# Patient Record
Sex: Male | Born: 1965
Health system: Southern US, Community
[De-identification: ages and names within clinical notes are randomized; demographics above are authoritative.]

## PROBLEM LIST (undated history)

## (undated) DIAGNOSIS — F419 Anxiety disorder, unspecified: Secondary | ICD-10-CM

## (undated) DIAGNOSIS — M810 Age-related osteoporosis without current pathological fracture: Secondary | ICD-10-CM

## (undated) DIAGNOSIS — I10 Essential (primary) hypertension: Secondary | ICD-10-CM

## (undated) DIAGNOSIS — F32A Depression, unspecified: Secondary | ICD-10-CM

## (undated) DIAGNOSIS — C4491 Basal cell carcinoma of skin, unspecified: Secondary | ICD-10-CM

## (undated) DIAGNOSIS — I428 Other cardiomyopathies: Secondary | ICD-10-CM

## (undated) DIAGNOSIS — G5600 Carpal tunnel syndrome, unspecified upper limb: Secondary | ICD-10-CM

## (undated) DIAGNOSIS — Z720 Tobacco use: Secondary | ICD-10-CM

## (undated) DIAGNOSIS — F329 Major depressive disorder, single episode, unspecified: Secondary | ICD-10-CM

## (undated) DIAGNOSIS — J189 Pneumonia, unspecified organism: Secondary | ICD-10-CM

## (undated) DIAGNOSIS — Z95810 Presence of automatic (implantable) cardiac defibrillator: Secondary | ICD-10-CM

## (undated) DIAGNOSIS — I509 Heart failure, unspecified: Secondary | ICD-10-CM

## (undated) DIAGNOSIS — G4733 Obstructive sleep apnea (adult) (pediatric): Secondary | ICD-10-CM

## (undated) DIAGNOSIS — H269 Unspecified cataract: Secondary | ICD-10-CM

## (undated) DIAGNOSIS — Z973 Presence of spectacles and contact lenses: Secondary | ICD-10-CM

## (undated) DIAGNOSIS — I472 Ventricular tachycardia, unspecified: Secondary | ICD-10-CM

## (undated) DIAGNOSIS — E11319 Type 2 diabetes mellitus with unspecified diabetic retinopathy without macular edema: Secondary | ICD-10-CM

## (undated) DIAGNOSIS — E785 Hyperlipidemia, unspecified: Secondary | ICD-10-CM

## (undated) DIAGNOSIS — I5022 Chronic systolic (congestive) heart failure: Secondary | ICD-10-CM

## (undated) DIAGNOSIS — G4730 Sleep apnea, unspecified: Secondary | ICD-10-CM

## (undated) DIAGNOSIS — J449 Chronic obstructive pulmonary disease, unspecified: Secondary | ICD-10-CM

## (undated) DIAGNOSIS — C801 Malignant (primary) neoplasm, unspecified: Secondary | ICD-10-CM

## (undated) HISTORY — DX: Age-related osteoporosis without current pathological fracture: M81.0

## (undated) HISTORY — DX: Anxiety disorder, unspecified: F41.9

## (undated) HISTORY — DX: Other cardiomyopathies: I42.8

## (undated) HISTORY — DX: Chronic obstructive pulmonary disease, unspecified: J44.9

## (undated) HISTORY — DX: Unspecified cataract: H26.9

## (undated) HISTORY — DX: Obstructive sleep apnea (adult) (pediatric): G47.33

## (undated) HISTORY — DX: Type 2 diabetes mellitus with unspecified diabetic retinopathy without macular edema: E11.319

## (undated) HISTORY — DX: Hyperlipidemia, unspecified: E78.5

## (undated) HISTORY — DX: Chronic systolic (congestive) heart failure: I50.22

## (undated) HISTORY — DX: Heart failure, unspecified: I50.9

## (undated) HISTORY — DX: Essential (primary) hypertension: I10

## (undated) HISTORY — PX: TONSILLECTOMY: SUR1361

## (undated) HISTORY — DX: Basal cell carcinoma of skin, unspecified: C44.91

## (undated) HISTORY — DX: Tobacco use: Z72.0

## (undated) HISTORY — DX: Ventricular tachycardia, unspecified: I47.20

## (undated) HISTORY — PX: CARPAL TUNNEL RELEASE: SHX101

## (undated) HISTORY — PX: EYE SURGERY: SHX253

---

## 1998-10-30 ENCOUNTER — Emergency Department (HOSPITAL_COMMUNITY): Admission: EM | Admit: 1998-10-30 | Discharge: 1998-10-30 | Payer: Self-pay

## 1999-02-16 HISTORY — PX: CARPAL TUNNEL RELEASE: SHX101

## 2001-06-02 ENCOUNTER — Encounter: Payer: Self-pay | Admitting: Emergency Medicine

## 2001-06-02 ENCOUNTER — Emergency Department (HOSPITAL_COMMUNITY): Admission: EM | Admit: 2001-06-02 | Discharge: 2001-06-02 | Payer: Self-pay | Admitting: Emergency Medicine

## 2002-07-22 ENCOUNTER — Emergency Department (HOSPITAL_COMMUNITY): Admission: EM | Admit: 2002-07-22 | Discharge: 2002-07-22 | Payer: Self-pay | Admitting: Emergency Medicine

## 2002-07-22 ENCOUNTER — Encounter: Payer: Self-pay | Admitting: Emergency Medicine

## 2003-12-28 ENCOUNTER — Ambulatory Visit: Payer: Self-pay | Admitting: Internal Medicine

## 2004-01-27 ENCOUNTER — Ambulatory Visit: Payer: Self-pay

## 2004-02-01 ENCOUNTER — Observation Stay (HOSPITAL_COMMUNITY): Admission: AD | Admit: 2004-02-01 | Discharge: 2004-02-02 | Payer: Self-pay | Admitting: Internal Medicine

## 2004-02-01 ENCOUNTER — Ambulatory Visit: Payer: Self-pay | Admitting: Internal Medicine

## 2004-02-01 HISTORY — PX: CARDIAC DEFIBRILLATOR PLACEMENT: SHX171

## 2004-02-21 ENCOUNTER — Ambulatory Visit: Payer: Self-pay | Admitting: Internal Medicine

## 2004-05-09 ENCOUNTER — Ambulatory Visit: Payer: Self-pay | Admitting: Internal Medicine

## 2004-08-28 ENCOUNTER — Ambulatory Visit: Payer: Self-pay

## 2004-08-28 ENCOUNTER — Ambulatory Visit: Payer: Self-pay | Admitting: Internal Medicine

## 2005-01-01 ENCOUNTER — Ambulatory Visit: Payer: Self-pay | Admitting: Internal Medicine

## 2005-04-16 ENCOUNTER — Ambulatory Visit: Payer: Self-pay

## 2005-07-19 ENCOUNTER — Ambulatory Visit: Payer: Self-pay | Admitting: Internal Medicine

## 2005-10-18 ENCOUNTER — Ambulatory Visit: Payer: Self-pay | Admitting: Internal Medicine

## 2006-01-17 ENCOUNTER — Ambulatory Visit: Payer: Self-pay | Admitting: Internal Medicine

## 2006-01-29 ENCOUNTER — Ambulatory Visit: Payer: Self-pay | Admitting: Internal Medicine

## 2006-05-02 ENCOUNTER — Ambulatory Visit: Payer: Self-pay | Admitting: Internal Medicine

## 2006-10-03 ENCOUNTER — Ambulatory Visit: Payer: Self-pay | Admitting: Internal Medicine

## 2007-01-21 ENCOUNTER — Ambulatory Visit: Payer: Self-pay | Admitting: Internal Medicine

## 2007-04-24 ENCOUNTER — Ambulatory Visit: Payer: Self-pay | Admitting: Internal Medicine

## 2007-07-24 ENCOUNTER — Ambulatory Visit: Payer: Self-pay | Admitting: Internal Medicine

## 2007-10-23 ENCOUNTER — Ambulatory Visit: Payer: Self-pay | Admitting: Internal Medicine

## 2008-02-10 ENCOUNTER — Ambulatory Visit: Payer: Self-pay | Admitting: Internal Medicine

## 2008-02-11 ENCOUNTER — Encounter: Payer: Self-pay | Admitting: Internal Medicine

## 2008-05-13 ENCOUNTER — Ambulatory Visit: Payer: Self-pay | Admitting: Internal Medicine

## 2008-05-25 ENCOUNTER — Encounter: Payer: Self-pay | Admitting: Internal Medicine

## 2008-08-12 ENCOUNTER — Ambulatory Visit: Payer: Self-pay | Admitting: Internal Medicine

## 2008-11-11 ENCOUNTER — Ambulatory Visit: Payer: Self-pay | Admitting: Internal Medicine

## 2008-11-24 ENCOUNTER — Encounter: Payer: Self-pay | Admitting: Internal Medicine

## 2009-01-26 DIAGNOSIS — E1165 Type 2 diabetes mellitus with hyperglycemia: Secondary | ICD-10-CM | POA: Insufficient documentation

## 2009-01-26 DIAGNOSIS — I509 Heart failure, unspecified: Secondary | ICD-10-CM | POA: Insufficient documentation

## 2009-01-26 DIAGNOSIS — IMO0002 Reserved for concepts with insufficient information to code with codable children: Secondary | ICD-10-CM | POA: Insufficient documentation

## 2009-01-26 DIAGNOSIS — I428 Other cardiomyopathies: Secondary | ICD-10-CM | POA: Insufficient documentation

## 2009-01-27 ENCOUNTER — Ambulatory Visit: Payer: Self-pay | Admitting: Internal Medicine

## 2009-04-28 ENCOUNTER — Ambulatory Visit: Payer: Self-pay | Admitting: Internal Medicine

## 2009-04-28 ENCOUNTER — Encounter: Payer: Self-pay | Admitting: Internal Medicine

## 2009-07-28 ENCOUNTER — Ambulatory Visit: Payer: Self-pay | Admitting: Internal Medicine

## 2009-11-11 ENCOUNTER — Encounter: Payer: Self-pay | Admitting: Internal Medicine

## 2010-02-14 ENCOUNTER — Ambulatory Visit
Admission: RE | Admit: 2010-02-14 | Discharge: 2010-02-14 | Payer: Self-pay | Source: Home / Self Care | Attending: Internal Medicine | Admitting: Internal Medicine

## 2010-02-16 NOTE — Progress Notes (Signed)
Summary: Med List   Med List   Imported By: Roderic Ovens 02/14/2009 16:09:32  _____________________________________________________________________  External Attachment:    Type:   Image     Comment:   External Document

## 2010-02-16 NOTE — Cardiovascular Report (Signed)
Summary: Office Visit Remote   Office Visit Remote   Imported By: Roderic Ovens 08/04/2009 16:09:00  _____________________________________________________________________  External Attachment:    Type:   Image     Comment:   External Document

## 2010-02-16 NOTE — Cardiovascular Report (Signed)
Summary: Office Visit Remote  Office Visit Remote   Imported By: Roderic Ovens 05/04/2009 14:56:22  _____________________________________________________________________  External Attachment:    Type:   Image     Comment:   External Document

## 2010-02-16 NOTE — Assessment & Plan Note (Signed)
Summary: PER CHECK OUT/SF  Medications Added CARVEDILOL 25 MG TABS (CARVEDILOL) Take two tablets by mouth twice a day CARVEDILOL 12.5 MG TABS (CARVEDILOL) Take one tablet by mouth twice a day FUROSEMIDE 40 MG TABS (FUROSEMIDE) Take one tablet by mouth two times a day AMLODIPINE BESYLATE 5 MG TABS (AMLODIPINE BESYLATE) Take one tablet by mouth daily LISINOPRIL 20 MG TABS (LISINOPRIL) Take one tablet by mouth two times a day DIGOXIN 0.125 MG TABS (DIGOXIN) Take one tablet by mouth daily CRESTOR 10 MG TABS (ROSUVASTATIN CALCIUM) Take one tablet by mouth daily. TRICOR 145 MG TABS (FENOFIBRATE) Take one tablet by mouth once daily. ASPIRIN 81 MG TBEC (ASPIRIN) Take one tablet by mouth daily EFFEXOR XR 75 MG XR24H-CAP (VENLAFAXINE HCL) 3 tabs daily TERBINAFINE HCL 250 MG TABS (TERBINAFINE HCL) Take one tablet by mouth once daily. ALPRAZOLAM 1 MG TABS (ALPRAZOLAM) Take one tablet by mouth once daily. CLONAZEPAM 1 MG TABS (CLONAZEPAM) Take one tablet by mouth once daily. CYCLOBENZAPRINE HCL 10 MG TABS (CYCLOBENZAPRINE HCL) Take one tablet by mouth once daily. HUMULIN 70/30 70-30 % SUSP (INSULIN ISOPHANE & REGULAR) UAD      Allergies Added: ! SULFA  History of Present Illness: Mr. Pallas returns today for followup.  He is a very pleasant young male with nonischemic cardiomyopathy, congestive heart failure status post ICD insertion back in January 2006.  At that time, had a Guidant single-chamber device placed.  He returns today for followup.   He has had no intercurrent ICD therapies.  He denies chest pain.  His heart failure is class II.  He is still working.   Current Medications (verified): 1)  Carvedilol 25 Mg Tabs (Carvedilol) .... Take Two Tablets By Mouth Twice A Day 2)  Carvedilol 12.5 Mg Tabs (Carvedilol) .... Take One Tablet By Mouth Twice A Day 3)  Furosemide 40 Mg Tabs (Furosemide) .... Take One Tablet By Mouth Two Times A Day 4)  Amlodipine Besylate 5 Mg Tabs (Amlodipine  Besylate) .... Take One Tablet By Mouth Daily 5)  Lisinopril 20 Mg Tabs (Lisinopril) .... Take One Tablet By Mouth Two Times A Day 6)  Digoxin 0.125 Mg Tabs (Digoxin) .... Take One Tablet By Mouth Daily 7)  Crestor 10 Mg Tabs (Rosuvastatin Calcium) .... Take One Tablet By Mouth Daily. 8)  Tricor 145 Mg Tabs (Fenofibrate) .... Take One Tablet By Mouth Once Daily. 9)  Aspirin 81 Mg Tbec (Aspirin) .... Take One Tablet By Mouth Daily 10)  Effexor Xr 75 Mg Xr24h-Cap (Venlafaxine Hcl) .... 3 Tabs Daily 11)  Terbinafine Hcl 250 Mg Tabs (Terbinafine Hcl) .... Take One Tablet By Mouth Once Daily. 12)  Alprazolam 1 Mg Tabs (Alprazolam) .... Take One Tablet By Mouth Once Daily. 13)  Clonazepam 1 Mg Tabs (Clonazepam) .... Take One Tablet By Mouth Once Daily. 14)  Cyclobenzaprine Hcl 10 Mg Tabs (Cyclobenzaprine Hcl) .... Take One Tablet By Mouth Once Daily. 15)  Humulin 70/30 70-30 % Susp (Insulin Isophane & Regular) .... Uad  Allergies (verified): 1)  ! Sulfa  Past History:  Past Medical History: Last updated: 01/26/2009 Current Problems:  IMPLANTATION OF DEFIBRILLATOR,GUIDANT MODEL #T177 (ICD-V45.02) CHF (ICD-428.0) CARDIOMYOPATHY (ICD-425.4) DYSLIPIDEMIA (ICD-272.4) DM (ICD-250.00)    Past Surgical History: Last updated: 01/26/2009  DATE OF PROCEDURE:  02/01/2004  PHYSICIAN:  Doylene Canning. Ladona Ridgel, M.D.  PROCEDURE:  On February 01, 2004, implantation of Guidant VITALITY 2 EL ICD,  model T177, with defibrillation threshold study less  than or equal to 21 joules, with acceptable pacing  sensing.  Review of Systems  The patient denies chest pain, syncope, dyspnea on exertion, and peripheral edema.    Vital Signs:  Patient profile:   45 year old male Height:      68 inches Weight:      196 pounds BMI:     29.91 Pulse rate:   82 / minute BP sitting:   120 / 80  (left arm)  Vitals Entered By: Laurance Flatten CMA (January 27, 2009 9:17 AM)  Physical Exam  General:  Well developed, well  nourished, in no acute distress.  HEENT: normal Neck: supple. No JVD. Carotids 2+ bilaterally no bruits Cor: RRR no rubs, gallops or murmur. PMI is enlarged. Lungs: CTA.  Well healed ICD incision. Ab: soft, nontender. nondistended. No HSM. Good bowel sounds Ext: warm. no cyanosis, clubbing or edema Neuro: alert and oriented. Grossly nonfocal. affect pleasant    ICD Specifications Following MD:  Lewayne Bunting, MD     ICD Vendor:  [facility] [employer]     ICD Model Number:  [phone]     ICD Serial Number:  [id number] ICD DOI:  02/01/2004     ICD Implanting MD:  Lewayne Bunting, MD  Lead 1:    Location: RV     DOI: 02/01/2004     Model #: [id number]     Serial #: [id number]     Status: active  Indications::  NICM   ICD Follow Up Remote Check?  No Battery Voltage:  2.70 V     Charge Time:  9.5 seconds     Underlying rhythm:  SR ICD Dependent:  No       ICD Device Measurements Right Ventricle:  Amplitude: 11.1 mV, Impedance: 550 ohms, Threshold: 1.0 V at 0.4 msec Shock Impedance: 57 ohms   Episodes Shock:  0     ATP:  0     Nonsustained:  0     Ventricular Pacing:  0%  Brady Parameters Mode VVI     Lower Rate Limit:  40      Tachy Zones VF:  240     VT:  200     VT1:  180     Next Remote Date:  04/28/2009     Next Cardiology Appt Due:  01/16/2010 Tech Comments:  Normal device function.  No changes made today.  Pt does Latitude transmisisons.  ROV 12 months Gt. Gypsy Balsam RN BSN  January 27, 2009 9:32 AM  MD Comments:  Agree with above.  Impression & Recommendations:  Problem # 1:  IMPLANTATION OF DEFIBRILLATOR,GUIDANT MODEL #T177 (ICD-V45.02) His device is working normally today.  Will recheck in several months.  Problem # 2:  CHF (ICD-428.0) His CHF remains class 1-2 on maximal medical therapy.  Continue current meds. A low sodium diet is recommended. His updated medication list for this problem includes:    Carvedilol 25 Mg Tabs (Carvedilol) .Marland Kitchen... Take two tablets by mouth twice a day     Carvedilol 12.5 Mg Tabs (Carvedilol) .Marland Kitchen... Take one tablet by mouth twice a day    Furosemide 40 Mg Tabs (Furosemide) .Marland Kitchen... Take one tablet by mouth two times a day    Amlodipine Besylate 5 Mg Tabs (Amlodipine besylate) .Marland Kitchen... Take one tablet by mouth daily    Lisinopril 20 Mg Tabs (Lisinopril) .Marland Kitchen... Take one tablet by mouth two times a day    Digoxin 0.125 Mg Tabs (Digoxin) .Marland Kitchen... Take one tablet by mouth daily    Aspirin 81 Mg Tbec (Aspirin) .Marland KitchenMarland KitchenMarland KitchenMarland Kitchen  Take one tablet by mouth daily  Patient Instructions: 1)  Your physician recommends that you schedule a follow-up appointment in: 12 months with Dr Ladona Ridgel

## 2010-02-16 NOTE — Letter (Signed)
Summary: Device-Delinquent Phone Journalist, newspaper, Main Office  [address]   [place], [place] [address]   Phone: [phone]  Fax: [phone]     November 11, 2009 MRN: [id number]   Justin Ray [address] [place], [place]  [address]   Dear [name],  According to our records, you were scheduled for a device phone transmission on 11-03-2009.     We did not receive any results from this check.  If you transmitted on your scheduled day, please call us to help troubleshoot your system.  If you forgot to send your transmission, please send one upon receipt of this letter.  Thank you,   [facility]

## 2010-02-16 NOTE — Cardiovascular Report (Signed)
Summary: Office Visit   Office Visit   Imported By: Roderic Ovens 02/02/2009 13:58:03  _____________________________________________________________________  External Attachment:    Type:   Image     Comment:   External Document

## 2010-02-22 NOTE — Assessment & Plan Note (Signed)
Summary: defib [email or web address]  Medications Added LOVAZA 1 GM CAPS (OMEGA-3-ACID ETHYL ESTERS) two times a day        Visit Type:  Follow-up   History of Present Illness: [name] returns today for followup.  He is a very pleasant young male with nonischemic cardiomyopathy, congestive heart failure status post ICD insertion back in January 2006.  At that time, had a Guidant single-chamber device placed.  He returns today for followup.   He has had no intercurrent ICD therapies.  He denies chest pain.  His heart failure is class II.  He is still working.   Current Medications (verified): 1)  Carvedilol 25 Mg Tabs (Carvedilol) .... Take Two Tablets By Mouth Twice A Day 2)  Furosemide 40 Mg Tabs (Furosemide) .... Take One Tablet By Mouth Two Times A Day 3)  Lisinopril 20 Mg Tabs (Lisinopril) .... Take One Tablet By Mouth Two Times A Day 4)  Digoxin 0.125 Mg Tabs (Digoxin) .... Take One Tablet By Mouth Daily 5)  Crestor 10 Mg Tabs (Rosuvastatin Calcium) .... Take One Tablet By Mouth Daily. 6)  Tricor 145 Mg Tabs (Fenofibrate) .... Take One Tablet By Mouth Once Daily. 7)  Aspirin 81 Mg Tbec (Aspirin) .... Take One Tablet By Mouth Daily 8)  Effexor Xr 75 Mg Xr24h-Cap (Venlafaxine Hcl) .... 3 Tabs Daily 9)  Alprazolam 1 Mg Tabs (Alprazolam) .... Take One Tablet By Mouth Once Daily. 10)  Clonazepam 1 Mg Tabs (Clonazepam) .... Take One Tablet By Mouth Once Daily. 11)  Humulin 70/30 70-30 % Susp (Insulin Isophane & Regular) .... Uad 12)  Lovaza 1 Gm Caps (Omega-3-Acid Ethyl Esters) .... Two Times A Day  Allergies: 1)  ! Sulfa  Past History:  Past Medical History: Last updated: 01/26/2009 Current Problems:  IMPLANTATION OF DEFIBRILLATOR,GUIDANT MODEL #T177 (ICD-V45.02) CHF (ICD-428.0) CARDIOMYOPATHY (ICD-425.4) DYSLIPIDEMIA (ICD-272.4) DM (ICD-250.00)    Review of Systems  The patient denies chest pain, syncope, dyspnea on exertion, and peripheral edema.    Vital  Signs:  Patient profile:   45 year old male Height:      68 inches Weight:      192 pounds BMI:     29.30 Pulse rate:   82 / minute BP sitting:   144 / 88  (left arm)  Vitals Entered By: Laurance Flatten CMA (February 14, 2010 3:50 PM)  Physical Exam  General:  Well developed, well nourished, in no acute distress.  HEENT: normal Neck: supple. No JVD. Carotids 2+ bilaterally no bruits Cor: RRR no rubs, gallops or murmur. PMI is enlarged. Lungs: CTA.  Well healed ICD incision. Ab: soft, nontender. nondistended. No HSM. Good bowel sounds Ext: warm. no cyanosis, clubbing or edema Neuro: alert and oriented. Grossly nonfocal. affect pleasant    ICD Specifications Following MD:  Justin Bunting, MD     ICD Vendor:  [facility] [employer]     ICD Model Number:  [id number]     ICD Serial Number:  [id number] ICD DOI:  02/01/2004     ICD Implanting MD:  Justin Bunting, MD  Lead 1:    Location: RV     DOI: 02/01/2004     Model #: 0981     Serial #: [id number]     Status: active  Indications::  NICM   ICD Follow Up Battery Voltage:  2.60 V     Charge Time:  12.5 seconds     Underlying rhythm:  SR @ 96 ICD Dependent:  No  ICD Device Measurements Right Ventricle:  Amplitude: 12.5 mV, Impedance: 563 ohms, Threshold: 1.0 V at 0.4 msec Shock Impedance: 56 ohms   Episodes MS Episodes:  0     Percent Mode Switch:  0     Shock:  0     ATP:  0     Nonsustained:  0     Atrial Therapies:  0 Ventricular Pacing:  0%  Brady Parameters Mode VVI     Lower Rate Limit:  40      Tachy Zones VF:  240     VT:  200     VT1:  180     Next Remote Date:  05/18/2010     Next Cardiology Appt Due:  01/16/2011 Tech Comments:  NORMAL DEVICE FUNCTION.  NO EPISODES SINCE LAST CHECK. NO CHANGES MADE. LATITUDE 05-18-10 AND ROV IN 12 MTHS W/GT. Vella Kohler  February 14, 2010 3:59 PM MD Comments:  Agree with above.  Impression & Recommendations:  Problem # 1:  IMPLANTATION OF DEFIBRILLATOR,GUIDANT MODEL #T177 (ICD-V45.02) His device  is working normally.  Will recheck in several months.  Problem # 2:  CHF (ICD-428.0) His chronic systlic heart failure is well controlled. The following medications were removed from the medication list:    Carvedilol 12.5 Mg Tabs (Carvedilol) .Marland Kitchen... Take one tablet by mouth twice a day    Amlodipine Besylate 5 Mg Tabs (Amlodipine besylate) .Marland Kitchen... Take one tablet by mouth daily His updated medication list for this problem includes:    Carvedilol 25 Mg Tabs (Carvedilol) .Marland Kitchen... Take two tablets by mouth twice a day    Furosemide 40 Mg Tabs (Furosemide) .Marland Kitchen... Take one tablet by mouth two times a day    Lisinopril 20 Mg Tabs (Lisinopril) .Marland Kitchen... Take one tablet by mouth two times a day    Digoxin 0.125 Mg Tabs (Digoxin) .Marland Kitchen... Take one tablet by mouth daily    Aspirin 81 Mg Tbec (Aspirin) .Marland Kitchen... Take one tablet by mouth daily  Patient Instructions: 1)  Your physician wants you to follow-up in:  12 months with Dr Court Joy will receive a reminder letter in the mail two months in advance. If you don't receive a letter, please call our office to schedule the follow-up appointment. 2)  [place] 05/18/2010

## 2010-05-17 ENCOUNTER — Other Ambulatory Visit: Payer: Self-pay | Admitting: Internal Medicine

## 2010-05-18 ENCOUNTER — Ambulatory Visit (INDEPENDENT_AMBULATORY_CARE_PROVIDER_SITE_OTHER): Payer: 59 | Admitting: *Deleted

## 2010-05-18 DIAGNOSIS — I428 Other cardiomyopathies: Secondary | ICD-10-CM

## 2010-05-25 NOTE — Progress Notes (Signed)
icd remote check  

## 2010-05-30 NOTE — Assessment & Plan Note (Signed)
[facility]                         ELECTROPHYSIOLOGY OFFICE NOTE   CLEMON, DEVAUL                       MRN:          [id number]  DATE:02/10/2008                            DOB:          29-Oct-1965    [name] returns today for followup.  He is a very pleasant young  male with nonischemic cardiomyopathy, congestive heart failure status  post ICD insertion back in January 2006.  At that time, had a Guidant  single-chamber device placed.  He returns today for followup.  In the  interim, he has become widowed with his wife dying unexpectedly back in  January 2009.  The patient has palpitations.  He has had no intercurrent  ICD therapies.  He denies chest pain.  His heart failure is class II.   MEDICINES:  1. Humulin 70/30 as directed.  2. Aspirin 81 a day.  3. Crestor 10 a day.  4. Tricor 145 a day.  5. Aldactone 25 a day.  6. Carvedilol 25 twice a day.  7. Furosemide 40 a day.  8. Lisinopril 20 twice a day.  9. Digoxin 0.125 daily.  10.Clonazepam 2 mg nightly.   PHYSICAL EXAMINATION:  GENERAL:  He is a pleasant young man in no acute  distress.  VITAL SIGNS:  Blood pressure today was 122/90, the pulse was 100 and  regular, the respirations were 18, and weight was 200 pounds.  NECK:  No jugular venous distention.  LUNGS:  Scattered rales in the bases.  No wheezes or rhonchi are  present.  There is no increased work of breathing.  CARDIOVASCULAR:  Regular tachycardia with normal S1 and S2.  There is a  soft S3 gallop.  The PMI was enlarged and laterally displaced.  ABDOMEN:  Soft and nontender.  There is no organomegaly.  EXTREMITIES:  Trace peripheral edema.   EKG demonstrates sinus tachycardia at 104 beats per minute.  There is a  septal MI.   Interrogation of his defibrillator demonstrates a Guidant Vitality T-  177.  R-waves were 12.  The impedance was 550.  The threshold was 0.8 at  0.4.  The battery voltage was 2.96 volts.   There are no intercurrent ICD  therapies.   IMPRESSION:  1. Nonischemic cardiomyopathy.  2. Congestive heart failure class II-III with a narrow QRS.  3. Sinus tachycardia.  4. Ongoing tobacco abuse.   DISCUSSION:  I have discussed with the patient the importance of  stopping smoking and I have asked him to do so.  With regard to his  sinus tach and his heart failure, I have asked that he start digoxin  0.125 mg daily with the potential to uptitrate him to 0.25 a day as  needed down the road.  He will continue with his other medications.  We  will see him back in several months.     Doylene Canning. Ladona Ridgel, MD  Electronically Signed    GWT/MedQ  DD: 02/10/2008  DT: 02/11/2008  Job #: [id number]

## 2010-05-30 NOTE — Assessment & Plan Note (Signed)
[facility]                         ELECTROPHYSIOLOGY OFFICE NOTE   Justin Ray                       MRN:          [id number]  DATE:01/21/2007                            DOB:          Feb 11, 1965    Justin Ray returns today for a follow up visit.  He is a very  pleasant young male with a history of longstanding diabetes and  hypertension and coronary disease who is status post ICD insertion.  He  returns today for follow up.  He had no specific complaints today,  though he does have class II heart failure symptoms.  I should note he  has a nonischemic cardiomyopathy.   MEDICATIONS:  1. Lisinopril 20 mg twice daily.  2. Coreg 25 twice daily, Aldactone 25 daily.  3. Lantus insulin.  4. Effexor.  5. Furosemide 40 twice daily.  6. Aspirin 81 kg daily.  7. Crestor.   PHYSICAL EXAMINATION:  GENERAL:  He is a pleasant, well-appearing young  man in no distress.  VITAL SIGNS:  Blood pressure was 150/90, the pulse 80 and regular,  respirations were 18.  Weight was 196 pounds.  NECK:  Revealed 7 cm jugular venous distention.  There was no  thyromegaly.  LUNGS:  Clear bilaterally to auscultation.  No wheezes, rales or  rhonchi.  CARDIOVASCULAR:  Regular rate and rhythm with a normal S1 and S2.  There  is a soft S4 gallop present.  EXTREMITIES:  No cyanosis, clubbing or edema.   Interrogation of his defibrillator demonstrates a Guidant Vitality with  R waves of 10, and impedance of 518 and a threshold of 0.4.  The battery  voltage was 3.15 volts.  There were no intercurrent ICD therapies.   IMPRESSION:  1. Nonischemic cardiomyopathy.  2. Congestive heart failure.  3. Status post ICD insertion.   DISCUSSION:  Overall, Justin Ray is stable.  Today, I spent a  considerable amount of time talking to him about stopping smoking and  the importance of this.  Also, because of his diabetes, I discussed with  him the possibility of considering  an insulin pump and have asked  that he follow up with his primary physician, Dr. Merla Ray, to consider  this further.  I will see him back in 1 year for ICD follow up.     Justin Ray. Justin Ridgel, MD  Electronically Signed    GWT/MedQ  DD: 01/21/2007  DT: 01/21/2007  Job #: [id number]   cc:   Justin Ray, M.D.  Justin Ray, M.D.

## 2010-06-02 NOTE — Op Note (Signed)
NAMEAMOUR, CUTRONE NO.:  [id number]   MEDICAL RECORD NO.:  [id number]          PATIENT TYPE:  INP   LOCATION:  2899                         FACILITY:  [facility]   PHYSICIAN:  Doylene Canning. Ladona Ridgel, M.D.  DATE OF BIRTH:  1965-03-13   DATE OF PROCEDURE:  02/01/2004  DATE OF DISCHARGE:                                 OPERATIVE REPORT   PROCEDURE:  Implantation of a single chamber defibrillator.   CARDIOLOGIST:  Doylene Canning. Ladona Ridgel, M.D.   INDICATIONS FOR PROCEDURE:  Non-ischemic cardiomyopathy with class 2 heart  failure and severe LV dysfunction with an ejection fraction of 20% for over  one year.   HISTORY:  The patient is a 45 year old man with a non-ischemic  cardiomyopathy, of unknown etiology, for over one year.  He has class 2  heart failure despite maximal medical therapy with beta blockers, ACE  inhibitors, diuretics and Aldactone.  He is now referred for an ICD  implantation.   DESCRIPTION OF PROCEDURE:  After an informed consent was obtained, the  patient was taken to the diagnostic CP laboratory in the fasting state.  After the usual preparation and draping, fentanyl and midazolam were given  for sedation.  Lidocaine 30 mL was infiltrated into the left infraclavicular  region.  A 9 cm incision was carried out over this region, and  electrocautery utilized to dissect down to the fascial plane.  Contrast 10  mL was injected into the left upper extremity venous system, which  demonstrated a patent left subclavian vein.  It was subsequently punctured  and the Guidant model [id number] Gore-Tex active fixation defibrillation  lead was advanced into the right ventricle.  Mapping was carried out in the  right ventricle at the final site.  The R-wave was measured at 11 mV and the  pacing threshold was 0.8 V at 0.5 msec, with a pacing impedance of 950 ohms.  The 10-volt pacing did not stimulate the diaphragm.  With the defibrillation  lead in satisfactory position, it  was secured to the sub-pectoralis fascia  with a figure-of-eight silk suture.  In addition, the sewing sleeve was  secured with silk suture.  Electrocautery was utilized to make a  subcutaneous pocket, and kanamycin was utilized to irrigate the pocket.  Electrocautery was utilized to assure hemostasis.  The Guidant model [id number],  serial [id number] single chamber defibrillator was then connected to the  defibrillation lead and placed in the subcutaneous pocket.  The generator  was secured with silk suture.  The defibrillation threshold testing was  carried out.   After the patient was more deeply sedated with fentanyl and Versed, VF was  induced with a T-wave shock.  A 14 joule shock was delivered, which failed  to terminate ventricular fibrillation.  A second 21 joule shock was then  delivered, which terminated the VF and restored sinus rhythm.  Five minutes  was allowed to elapse and a second DFT test carried out.  Again VF was  induced with a T-wave shock.  Again a 21 joule shock was delivered.  This  time the shock failed to terminate ventricular  fibrillation, and the patient  was resuscitated with a 360 joule shock, restoring atrial fibrillation.  The  patient was noted to be in atrial fibrillation with a rate of 140 beats per  minute, and was subsequently given IV Lopressor.  Five additional minutes  was allowed to elapse.  A final DFT test was carried out, this time with  reverse polarity.  Again VF was induced with a T-wave shock, and this time a  21 joule shock with reverse polarity was delivered, which terminated the  ventricular fibrillation and restored sinus rhythm.  No additional  defibrillation threshold testing was carried out, and the incision was  closed with a layer of #2-0 Vicryl, followed by a layer of #3-0 Vicryl,  followed by a layer of #4-0 Vicryl.  Benzoin was painted on the skin.  Steri-  Strips were applied and a pressure dressing was placed.   The patient was  returned to his room in satisfactory condition.   COMPLICATIONS:  There were no immediate procedural complications.   RESULTS:  This demonstrates a successful implantation of Guidant single  chamber defibrillation in a patient with non-ischemic cardiomyopathy, class  2 heart failure despite maximal medical therapy for greater than one year's  duration and dilated cardiomyopathy with an ejection fraction of 20%.       GWT/MEDQ  D:  02/01/2004  T:  02/01/2004  Job:  [id number]   cc:   Florian Buff, M.D.   Dr. Farris Has, [place]

## 2010-06-02 NOTE — Discharge Summary (Signed)
NAME:  Justin Ray, Justin Ray NO.:  [id number]   MEDICAL RECORD NO.:  [id number]          PATIENT TYPE:  INP   LOCATION:  3706                         FACILITY:  [facility]   PHYSICIAN:  Doylene Canning. Ladona Ridgel, M.D.  DATE OF BIRTH:  [birth year]   DATE OF ADMISSION:  02/01/2004  DATE OF DISCHARGE:  02/02/2004                                 DISCHARGE SUMMARY   DISCHARGE DIAGNOSES:  1.  Discharging day one status post implant of Guidant VITALITY cardioverter      defibrillator per SCD-HeFT criteria.  2.  Nonischemic cardiomyopathy, with ejection fraction 20%.  3.  Class II congestive heart failure.   SECONDARY DIAGNOSES:  1.  Ongoing tobacco habituation.  2.  Diabetes mellitus.  3.  Dyslipidemia.   PROCEDURE:  On February 01, 2004, implantation of Guidant VITALITY 2 EL ICD,  model T177, by Dr. Lewayne Bunting, with defibrillation threshold study less  than or equal to 21 joules, with acceptable pacing sensing.   DISCHARGE DISPOSITION:  Justin Ray is ready for discharge postprocedure  day one.  His incision is healing nicely, without swelling, erythema, or  drainage.  He has no cardiac rub.  Chest x-ray shows no pneumothorax, lead  in appropriate position, and lungs are clear.  He is achieving 95% oxygen  saturation on room air.  He has been in sinus rhythm this hospitalization.   He discharges on the following medications:  1.  Lisinopril 20 mg b.i.d.  2.  Glipizide ER 10 mg b.i.d.  3.  Coreg 25 mg b.i.d.  4.  Niaspan 500 mg daily.  5.  Lasix 20 mg daily.  6.  Spironolactone 25 mg daily.  7.  Lantus 10 unit subcutaneously injected at bedtime.  8.  Effexor XR 75 mg t.i.d.  9.  Percocet 5/325, 1-2 tabs q.4-6 h. as needed for pain.   Discharge mobility has been described to the patient, and he is to keep his  incision dry for the next seven days.   DISCHARGE DIET:  Low sodium, low cholesterol diet.   He is asked to follow up in [facility], [address],  [facility] Monday February 21, 2004 at 9:30 in the morning, and he sees Dr.  Ladona Ridgel Tuesday May 09, 2004 at 11:30.   BRIEF HISTORY:  Justin Ray is a 45 year old male with a history of  nonischemic cardiomyopathy.  This was documented over a year ago.  He has  persistence of left ventricular dysfunction, with an ejection fraction of  25%.  He has been on optimal medications for heart failure but continues to  have class II symptoms.  He denies any prior history of syncope.  He denies  chest pain.  He denies peripheral edema.  The patient recently underwent a  2D echocardiogram.  The study showed an ejection fraction of 20-25%.  The  patient denies alcohol use.  Does currently smoke one pack of cigarettes a  day, two to three caffeinated beverages.  The patient qualifies for  implantable cardioverter defibrillator based on the SCD-HeFT data.  Risks,  benefits, goals, and expectations of  the procedure have been discussed with  the patient.  He will present electively for implantation February 01, 2004.   HOSPITAL COURSE:  The patient presented to [facility] February 01, 2004.  He underwent implantation of Guidant cardioverter defibrillator the  same day.  He has had no postprocedure complications, no respiratory  compromise, no cardiac dysrhythmias.  Chest x-ray shows the lead is in an  appropriate place.  He also had interrogation postprocedure day one, with  all parameters within normal limits.  The incision looks good at the time of  discharge.  Mobility issues have been discussed with the patient.  The  patient will follow up as dictated.       GM/MEDQ  D:  02/02/2004  T:  02/02/2004  Job:  [id number]   cc:   Doylene Canning. Ladona Ridgel, M.D.   Doreen Beam  [address]  [place]  [place] [address]  Fax: [phone]   Harrel Lemon. Merla Riches, M.D.  [address]  [place]  [place] [address]  Fax: [phone]

## 2010-06-02 NOTE — Assessment & Plan Note (Signed)
[facility]                         ELECTROPHYSIOLOGY OFFICE NOTE   JAYRON, MAQUEDA                       MRN:          [id number]  DATE:01/29/2006                            DOB:          [birth year]    Mr. Kurka returns today for followup.  He is a very pleasant young  man with a history of non-ischemic cardiomyopathy and congestive heart  failure.  He is status post ICD insertion approximately two years ago.  He returns today for followup.  He overall has been stable.  He denies  chest pain or shortness of breath.   EXAM:  He is a pleasant, well-appearing young man in no distress.  Blood  pressure 140/90, the pulse 87 and regular, respirations were 18 and the  weight was 190 pounds.  NECK:  Revealed no jugular venous distention.  LUNGS:  Clear bilaterally to auscultation.  There are no wheezes, rales  or rhonchi.  CARDIOVASCULAR EXAM:  Revealed a regular rate and rhythm with normal S1  and S2.  EXTREMITIES:  Demonstrate no cyanosis, clubbing or edema.   Interrogation of his defibrillator demonstrates a Guidant Vitality with  R-waves of 11 and pacing impedance of 556 ohms, threshold 0.8 volts, 0.5  milliseconds.  The battery voltage was 3.2 volts.  Charge time was 6.7  seconds.   IMPRESSION:  1. Non-ischemic cardiomyopathy.  2. Congestive heart failure.  3. Status post ICD insertion.   DISCUSSION:  Overall, Mr. Sandler is stable.  His defibrillator is  working normally.  We will see him back in the office in a year and he  will follow up in our [facility] program.     Doylene Canning. Ladona Ridgel, MD  Electronically Signed    GWT/MedQ  DD: 01/29/2006  DT: 01/30/2006  Job #: [id number]

## 2010-06-20 ENCOUNTER — Encounter: Payer: Self-pay | Admitting: Cardiology

## 2010-08-09 ENCOUNTER — Other Ambulatory Visit: Payer: Self-pay | Admitting: Internal Medicine

## 2010-08-10 NOTE — Telephone Encounter (Signed)
**Note De-identified  Obfuscation** [place] pt. 

## 2010-08-17 ENCOUNTER — Ambulatory Visit (INDEPENDENT_AMBULATORY_CARE_PROVIDER_SITE_OTHER): Payer: 59 | Admitting: *Deleted

## 2010-08-17 ENCOUNTER — Other Ambulatory Visit: Payer: Self-pay | Admitting: Internal Medicine

## 2010-08-17 DIAGNOSIS — I509 Heart failure, unspecified: Secondary | ICD-10-CM

## 2010-08-17 DIAGNOSIS — I428 Other cardiomyopathies: Secondary | ICD-10-CM

## 2010-08-22 LAB — REMOTE ICD DEVICE
BRDY-0002RV: 40 {beats}/min
CHARGE TIME: 12.5 s
DEV-0020ICD: NEGATIVE
TOT-0006: 20120503000000
TZAT-0001SLOWVT: 2
TZAT-0013FASTVT: 2
TZAT-0013SLOWVT: 2
TZAT-0018FASTVT: NEGATIVE
TZAT-0018FASTVT: NEGATIVE
TZAT-0018SLOWVT: NEGATIVE
TZST-0001FASTVT: 3
TZST-0001FASTVT: 4
TZST-0001FASTVT: 7
TZST-0001SLOWVT: 4
TZST-0001SLOWVT: 7
TZST-0003FASTVT: 31 J
TZST-0003FASTVT: 31 J
TZST-0003SLOWVT: 23 J
TZST-0003SLOWVT: 31 J
TZST-0003SLOWVT: 31 J
TZST-0003SLOWVT: 31 J
TZST-0003SLOWVT: 31 J
VENTRICULAR PACING ICD: 0 pct

## 2010-08-23 NOTE — Progress Notes (Signed)
icd remote check  

## 2010-09-01 ENCOUNTER — Encounter: Payer: Self-pay | Admitting: *Deleted

## 2010-11-23 ENCOUNTER — Encounter: Payer: Self-pay | Admitting: Internal Medicine

## 2010-11-23 ENCOUNTER — Other Ambulatory Visit: Payer: Self-pay | Admitting: Internal Medicine

## 2010-11-23 ENCOUNTER — Ambulatory Visit (INDEPENDENT_AMBULATORY_CARE_PROVIDER_SITE_OTHER): Payer: 59 | Admitting: *Deleted

## 2010-11-23 DIAGNOSIS — Z95810 Presence of automatic (implantable) cardiac defibrillator: Secondary | ICD-10-CM

## 2010-11-23 DIAGNOSIS — I428 Other cardiomyopathies: Secondary | ICD-10-CM

## 2010-11-26 LAB — REMOTE ICD DEVICE
BATTERY VOLTAGE: 2.59 V
DEVICE MODEL ICD: 104670
FVT: 0
HV IMPEDENCE: 54 Ohm
PACEART VT: 0
RV LEAD IMPEDENCE ICD: 500 Ohm
TZAT-0001FASTVT: 1
TZAT-0001FASTVT: 2
TZAT-0001SLOWVT: 1
TZAT-0002FASTVT: NEGATIVE
TZAT-0002SLOWVT: NEGATIVE
TZAT-0013FASTVT: 2
TZAT-0018FASTVT: NEGATIVE
TZAT-0018SLOWVT: NEGATIVE
TZON-0003FASTVT: 300 ms
TZON-0003SLOWVT: 333.3 ms
TZST-0001FASTVT: 4
TZST-0001FASTVT: 5
TZST-0001SLOWVT: 3
TZST-0001SLOWVT: 5
TZST-0001SLOWVT: 6
TZST-0003FASTVT: 31 J
TZST-0003FASTVT: 31 J
TZST-0003FASTVT: 31 J
TZST-0003SLOWVT: 31 J
TZST-0003SLOWVT: 31 J
TZST-0003SLOWVT: 31 J

## 2010-12-01 NOTE — Progress Notes (Signed)
Remote icd check  

## 2011-02-06 ENCOUNTER — Ambulatory Visit (INDEPENDENT_AMBULATORY_CARE_PROVIDER_SITE_OTHER): Payer: 59 | Admitting: Internal Medicine

## 2011-02-06 ENCOUNTER — Encounter: Payer: Self-pay | Admitting: Internal Medicine

## 2011-02-06 VITALS — BP 134/96 | HR 75 | Ht 68.0 in | Wt 196.0 lb

## 2011-02-06 DIAGNOSIS — I428 Other cardiomyopathies: Secondary | ICD-10-CM

## 2011-02-06 DIAGNOSIS — Z95810 Presence of automatic (implantable) cardiac defibrillator: Secondary | ICD-10-CM | POA: Insufficient documentation

## 2011-02-06 DIAGNOSIS — I509 Heart failure, unspecified: Secondary | ICD-10-CM

## 2011-02-06 DIAGNOSIS — E785 Hyperlipidemia, unspecified: Secondary | ICD-10-CM

## 2011-02-06 LAB — ICD DEVICE OBSERVATION
HV IMPEDENCE: 53 Ohm
RV LEAD AMPLITUDE: 10.1 mv
RV LEAD IMPEDENCE ICD: 512 Ohm
RV LEAD THRESHOLD: 1.4 V
TZAT-0001FASTVT: 1
TZAT-0001SLOWVT: 1
TZAT-0002FASTVT: NEGATIVE
TZAT-0002SLOWVT: NEGATIVE
TZAT-0013SLOWVT: 2
TZAT-0018FASTVT: NEGATIVE
TZAT-0018SLOWVT: NEGATIVE
TZON-0003FASTVT: 300 ms
TZST-0001FASTVT: 5
TZST-0001FASTVT: 6
TZST-0001SLOWVT: 3
TZST-0001SLOWVT: 4
TZST-0001SLOWVT: 6
TZST-0001SLOWVT: 7
TZST-0003FASTVT: 31 J
TZST-0003FASTVT: 31 J
TZST-0003SLOWVT: 23 J
TZST-0003SLOWVT: 31 J
TZST-0003SLOWVT: 31 J

## 2011-02-06 MED ORDER — DIGOXIN 125 MCG PO TABS: 125.0000 ug | ORAL_TABLET | Freq: Every day | ORAL | Status: DC

## 2011-02-06 NOTE — Patient Instructions (Signed)
Your physician wants you to follow-up in: 12 months with Dr Taylor You will receive a reminder letter in the mail two months in advance. If you don't receive a letter, please call our office to schedule the follow-up appointment.   Remote monitoring is used to monitor your Pacemaker of ICD from home. This monitoring reduces the number of office visits required to check your device to one time per year. It allows us to keep an eye on the functioning of your device to ensure it is working properly. You are scheduled for a device check from home on 05/10/11. You may send your transmission at any time that day. If you have a wireless device, the transmission will be sent automatically. After your physician reviews your transmission, you will receive a postcard with your next transmission date.   

## 2011-02-06 NOTE — Assessment & Plan Note (Signed)
His chronic systolic heart failure remains well controlled. He'll continue his current medical therapy and maintain a low-sodium diet.

## 2011-02-06 NOTE — Progress Notes (Signed)
HPI Justin Ray returns today for followup. He is a very pleasant 46 year old man with a nonischemic cardiomyopathy, chronic class II congestive heart failure, severe dyslipidemia and hyper triglyceridemia, status post ICD implantation. The patient denies any recent ICD shocks. He is trying to lose weight and improve his diet. Allergies  Allergen Reactions  . Sulfonamide Derivatives      Current Outpatient Prescriptions  Medication Sig Dispense Refill  . ALPRAZolam (XANAX) 1 MG tablet Take 1 mg by mouth as needed.      Marland Kitchen aspirin 81 MG tablet Take 160 mg by mouth daily.      . carvedilol (COREG) 25 MG tablet Take 50 mg by mouth 2 (two) times daily with a meal.      . clonazePAM (KLONOPIN) 1 MG tablet Take 1 mg by mouth as needed.      . digoxin (LANOXIN) 0.125 MG tablet Take 1 tablet (125 mcg total) by mouth daily.  90 tablet  3  . fenofibrate (TRICOR) 145 MG tablet Take 145 mg by mouth daily.      . furosemide (LASIX) 40 MG tablet Take 40 mg by mouth as needed.      . insulin NPH-insulin regular (NOVOLIN 70/30) (70-30) 100 UNIT/ML injection Inject into the skin as directed.      Marland Kitchen lisinopril (PRINIVIL,ZESTRIL) 20 MG tablet Take 20 mg by mouth 2 (two) times daily.      Marland Kitchen omega-3 acid ethyl esters (LOVAZA) 1 G capsule Take 2 g by mouth 2 (two) times daily.      . rosuvastatin (CRESTOR) 10 MG tablet Take 10 mg by mouth daily.      Marland Kitchen venlafaxine (EFFEXOR-XR) 75 MG 24 hr capsule Take 225 mg by mouth daily.         Past Medical History  Diagnosis Date  . CHF (congestive heart failure)   . Cardiomyopathy   . Dyslipidemia   . Diabetes mellitus     ROS:   All systems reviewed and negative except as noted in the HPI.   Past Surgical History  Procedure Date  . Cardiac defibrillator placement 02/01/2004  . Tonsillectomy   . Eye surgery      Family History  Problem Relation Age of Onset  . Cancer    . Coronary artery disease Father     CABG     History   Social History  .  Marital Status: Widowed    Spouse Name: N/A    Number of Children: 0  . Years of Education: N/A   Occupational History  .  [employer]   Social History Main Topics  . Smoking status: Current Everyday Smoker  . Smokeless tobacco: Not on file  . Alcohol Use: No  . Drug Use: No  . Sexually Active: Not on file   Other Topics Concern  . Not on file   Social History Narrative  . No narrative on file     BP 134/96  Pulse 75  Ht 5\' 8"  (1.727 m)  Wt 88.905 kg (196 lb)  BMI 29.80 kg/m2  Physical Exam:  Well appearing NAD HEENT: Unremarkable Neck:  No JVD, no thyromegally Lymphatics:  No adenopathy Back:  No CVA tenderness Lungs:  Clear HEART:  Regular rate rhythm, no murmurs, no rubs, no clicks Abd:  soft, positive bowel sounds, no organomegally, no rebound, no guarding Ext:  2 plus pulses, no edema, no cyanosis, no clubbing Skin:  No rashes no nodules Neuro:  CN II through XII  intact, motor grossly intact  DEVICE  Normal device function.  See PaceArt for details.   Assess/Plan:

## 2011-02-06 NOTE — Assessment & Plan Note (Signed)
I have encouraged him to maintain a low-fat diet. He is instructed to increase his activity. Weight loss has been encouraged.

## 2011-02-06 NOTE — Assessment & Plan Note (Signed)
His device is working normally. He is approaching elective replacement but now 7 years after his initial implant is still at middle of life 2.

## 2011-02-28 ENCOUNTER — Ambulatory Visit (INDEPENDENT_AMBULATORY_CARE_PROVIDER_SITE_OTHER): Payer: 59 | Admitting: Internal Medicine

## 2011-02-28 ENCOUNTER — Encounter: Payer: Self-pay | Admitting: Internal Medicine

## 2011-02-28 ENCOUNTER — Telehealth: Payer: Self-pay

## 2011-02-28 VITALS — BP 131/85 | HR 87 | Temp 97.3°F | Resp 16 | Ht 68.5 in | Wt 197.2 lb

## 2011-02-28 DIAGNOSIS — E118 Type 2 diabetes mellitus with unspecified complications: Secondary | ICD-10-CM

## 2011-02-28 DIAGNOSIS — E1169 Type 2 diabetes mellitus with other specified complication: Secondary | ICD-10-CM

## 2011-02-28 DIAGNOSIS — J4489 Other specified chronic obstructive pulmonary disease: Secondary | ICD-10-CM | POA: Insufficient documentation

## 2011-02-28 DIAGNOSIS — F341 Dysthymic disorder: Secondary | ICD-10-CM | POA: Insufficient documentation

## 2011-02-28 DIAGNOSIS — F418 Other specified anxiety disorders: Secondary | ICD-10-CM

## 2011-02-28 DIAGNOSIS — J449 Chronic obstructive pulmonary disease, unspecified: Secondary | ICD-10-CM

## 2011-02-28 DIAGNOSIS — G4700 Insomnia, unspecified: Secondary | ICD-10-CM | POA: Insufficient documentation

## 2011-02-28 DIAGNOSIS — F17200 Nicotine dependence, unspecified, uncomplicated: Secondary | ICD-10-CM

## 2011-02-28 LAB — LIPID PANEL
Cholesterol: 102 mg/dL (ref 0–200)
HDL: 33 mg/dL — ABNORMAL LOW (ref >39)
Total CHOL/HDL Ratio: 3.1 Ratio
Triglycerides: 200 mg/dL — ABNORMAL HIGH (ref <150)

## 2011-02-28 LAB — CBC WITH DIFFERENTIAL/PLATELET
Basophils Absolute: 0 10*3/uL (ref 0.0–0.1)
Basophils Relative: 0 % (ref 0–1)
Eosinophils Absolute: 0.1 10*3/uL (ref 0.0–0.7)
MCH: 31.5 pg (ref 26.0–34.0)
MCHC: 35.4 g/dL (ref 30.0–36.0)
Neutro Abs: 6 10*3/uL (ref 1.7–7.7)
Neutrophils Relative %: 67 % (ref 43–77)
RDW: 13.1 % (ref 11.5–15.5)

## 2011-02-28 LAB — COMPREHENSIVE METABOLIC PANEL
CO2: 22 mEq/L (ref 19–32)
Creat: 0.81 mg/dL (ref 0.50–1.35)
Glucose, Bld: 160 mg/dL — ABNORMAL HIGH (ref 70–99)
Total Bilirubin: 0.4 mg/dL (ref 0.3–1.2)

## 2011-02-28 MED ORDER — NEEDLES & SYRINGES MISC: Status: DC

## 2011-02-28 MED ORDER — ALPRAZOLAM 1 MG PO TABS: 1.0000 mg | ORAL_TABLET | Freq: Two times a day (BID) | ORAL | Status: DC | PRN

## 2011-02-28 MED ORDER — VENLAFAXINE HCL ER 75 MG PO CP24: 225.0000 mg | ORAL_CAPSULE | Freq: Three times a day (TID) | ORAL | Status: DC

## 2011-02-28 NOTE — Progress Notes (Signed)
  Subjective:    Patient ID: Justin Ray, male    DOB: 02-11-1965, 46 y.o.   MRN: [id number]  HPIAllan returns for followup of his numerous medical problems. He has been surprisingly stable for the last 6 months. There is less grieving for the loss of his wife and he is adapting to his job without new injuries. His heart has remained stable. He says his diabetes control has been good. He spends his spare time fixing his old house    Review of Systems  Constitutional: Negative for activity change, appetite change and fatigue.  HENT: Negative for hearing loss.   Eyes: Negative for visual disturbance.  Respiratory: Negative for shortness of breath.   Cardiovascular: Negative for chest pain, palpitations and leg swelling.  Gastrointestinal: Negative for abdominal pain, diarrhea, constipation and blood in stool.  Genitourinary: Negative for difficulty urinating.  Musculoskeletal: Negative for back pain.  Neurological: Negative for dizziness, syncope, weakness and headaches.  Hematological: Does not bruise/bleed easily.  Psychiatric/Behavioral: Negative for dysphoric mood.       Objective:   Physical Exam  Constitutional: He is oriented to person, place, and time. He appears well-developed.  Eyes: EOM are normal. Pupils are equal, round, and reactive to light.  Neck: Neck supple. No thyromegaly present.  Cardiovascular: Normal rate, regular rhythm, normal heart sounds and intact distal pulses.   Pulmonary/Chest: Breath sounds normal.  Musculoskeletal: He exhibits no edema.       Flexor tendon nodule in the palm along the fourth tendon  Neurological: He is alert and oriented to person, place, and time.       No sensory losses in the extremities  Psychiatric: He has a normal mood and affect.          Assessment & Plan:  Problem #1 diabetes As his hemoglobin A1c is over 8 he is advised to slowly increase his a.m. Or p.m. Dose depending on the sugar levels Problem #2 cardiomyopathy  with congestive heart failure and ICD He has about 9 months before his defibrillator will need to be replaced he is stable for now Problem #3 hyperlipidemia He has continued to struggle with triglyceride control. 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 His cardiologist changed him to Lovaza and Crestor Problem #4 depression and anxiety with insomnia these problems are currently stable Problem #5 nicotine addiction he is unwilling to change at this point  Problem #6 COPD with asthma stable at this point  His regular labs will be ordered and shared with his cardiologist and then he will follow up here in 3-6 months as needed. C. meds and orders

## 2011-02-28 NOTE — Telephone Encounter (Signed)
.  [facility] FROM [facility] NEED TO CLARIFY THE DIRECTIONS ON PT'S MEDICINE.  PLEASE CALL [phone]

## 2011-03-01 ENCOUNTER — Encounter: Payer: Self-pay | Admitting: Internal Medicine

## 2011-03-01 NOTE — Telephone Encounter (Signed)
Spoke with pharmacy who had ? About what "needles and syringes" were needed for pt/what medication for. Checked med list and verified they are for pts Novolin, and pharmacy can fill with what pt has been using.

## 2011-03-02 ENCOUNTER — Encounter: Payer: Self-pay | Admitting: Internal Medicine

## 2011-04-04 ENCOUNTER — Encounter: Payer: Self-pay | Admitting: Internal Medicine

## 2011-05-10 ENCOUNTER — Ambulatory Visit (INDEPENDENT_AMBULATORY_CARE_PROVIDER_SITE_OTHER): Payer: 59 | Admitting: *Deleted

## 2011-05-10 ENCOUNTER — Encounter: Payer: Self-pay | Admitting: Internal Medicine

## 2011-05-10 DIAGNOSIS — I428 Other cardiomyopathies: Secondary | ICD-10-CM

## 2011-05-10 DIAGNOSIS — F341 Dysthymic disorder: Secondary | ICD-10-CM

## 2011-05-10 DIAGNOSIS — Z95810 Presence of automatic (implantable) cardiac defibrillator: Secondary | ICD-10-CM

## 2011-05-16 LAB — REMOTE ICD DEVICE
DEV-0020ICD: NEGATIVE
DEVICE MODEL ICD: 104670
FVT: 0
HV IMPEDENCE: 56 Ohm
PACEART VT: 0
RV LEAD AMPLITUDE: 9.8 mv
TOT-0006: 20121108000000
TZAT-0001FASTVT: 1
TZAT-0001SLOWVT: 1
TZAT-0002FASTVT: NEGATIVE
TZAT-0013SLOWVT: 2
TZAT-0018FASTVT: NEGATIVE
TZAT-0018SLOWVT: NEGATIVE
TZON-0003FASTVT: 300 ms
TZON-0003SLOWVT: 333.3 ms
TZST-0001FASTVT: 5
TZST-0001FASTVT: 6
TZST-0001SLOWVT: 3
TZST-0001SLOWVT: 4
TZST-0001SLOWVT: 5
TZST-0001SLOWVT: 6
TZST-0003FASTVT: 31 J
TZST-0003FASTVT: 31 J
TZST-0003FASTVT: 31 J
TZST-0003SLOWVT: 23 J
TZST-0003SLOWVT: 31 J
TZST-0003SLOWVT: 31 J
VF: 0

## 2011-05-21 NOTE — Progress Notes (Signed)
Remote icd check  

## 2011-06-01 ENCOUNTER — Encounter: Payer: Self-pay | Admitting: *Deleted

## 2011-06-19 ENCOUNTER — Telehealth: Payer: Self-pay

## 2011-06-19 ENCOUNTER — Other Ambulatory Visit: Payer: Self-pay

## 2011-06-19 DIAGNOSIS — F418 Other specified anxiety disorders: Secondary | ICD-10-CM

## 2011-06-19 NOTE — Telephone Encounter (Signed)
Called pharmacy about refills

## 2011-07-04 ENCOUNTER — Ambulatory Visit (INDEPENDENT_AMBULATORY_CARE_PROVIDER_SITE_OTHER): Payer: 59 | Admitting: Internal Medicine

## 2011-07-04 ENCOUNTER — Encounter: Payer: Self-pay | Admitting: Internal Medicine

## 2011-07-04 VITALS — BP 133/86 | HR 91 | Temp 97.3°F | Resp 16 | Ht 68.0 in | Wt 192.8 lb

## 2011-07-04 DIAGNOSIS — F341 Dysthymic disorder: Secondary | ICD-10-CM

## 2011-07-04 MED ORDER — MUPIROCIN 2 % EX OINT: TOPICAL_OINTMENT | Freq: Three times a day (TID) | CUTANEOUS | Status: AC

## 2011-07-04 MED ORDER — DESVENLAFAXINE SUCCINATE ER 50 MG PO TB24: 50.0000 mg | ORAL_TABLET | Freq: Every day | ORAL | Status: DC

## 2011-07-04 MED ORDER — DOXYCYCLINE MONOHYDRATE 100 MG PO CAPS: 100.0000 mg | ORAL_CAPSULE | Freq: Two times a day (BID) | ORAL | Status: AC

## 2011-07-04 NOTE — Progress Notes (Signed)
1. Dysthymic disorder    His insurance company is now denying coverage for effexor and he has been on 225 mg for several years With very good results He has noticed some feelings like things are crawling in his skin and occasionally sees something out of the corner of his eye that is not there Mood is fairly stable/no suicidal ideation/generally happy with work  He also has lesions on his right leg that are nonhealing after 3 or 4 weeks/he thinks he started with some type of bite from an insect in could be chiggers or red ants  Exam 4 significant lesions on the right lower leg that are 1 cm, circular, with redness around a central scab. No pus is present and no regional cellulitis Affect appropriate mood stable   Plan-change to proceed 50 mg daily and refill 3 followup appointment in August for all his medical problems If he notices a change in control with this medicine he is to call leaving a message at once  Treat the skin lesions as secondary infection and recheck those when he returns Meds ordered this encounter  Medications  . desvenlafaxine (PRISTIQ) 50 MG 24 hr tablet    Sig: Take 1 tablet (50 mg total) by mouth daily.    Dispense:  30 tablet    Refill:  2  . doxycycline (MONODOX) 100 MG capsule    Sig: Take 1 capsule (100 mg total) by mouth 2 (two) times daily.    Dispense:  20 capsule    Refill:  0  . mupirocin ointment (BACTROBAN) 2 %    Sig: Apply topically 3 (three) times daily.    Dispense:  22 g    Refill:  0

## 2011-08-10 ENCOUNTER — Encounter: Payer: Self-pay | Admitting: Internal Medicine

## 2011-08-23 ENCOUNTER — Encounter: Payer: Self-pay | Admitting: Internal Medicine

## 2011-08-23 ENCOUNTER — Ambulatory Visit (INDEPENDENT_AMBULATORY_CARE_PROVIDER_SITE_OTHER): Payer: 59 | Admitting: *Deleted

## 2011-08-23 DIAGNOSIS — Z95810 Presence of automatic (implantable) cardiac defibrillator: Secondary | ICD-10-CM

## 2011-08-23 DIAGNOSIS — I509 Heart failure, unspecified: Secondary | ICD-10-CM

## 2011-08-26 LAB — REMOTE ICD DEVICE
BATTERY VOLTAGE: 2.58 V
BRDY-0002RV: 40 {beats}/min
CHARGE TIME: 12.5 s
DEV-0020ICD: NEGATIVE
FVT: 0
RV LEAD IMPEDENCE ICD: 478 Ohm
TOT-0006: 20130425000000
TZAT-0001FASTVT: 2
TZAT-0001SLOWVT: 1
TZAT-0001SLOWVT: 2
TZAT-0002FASTVT: NEGATIVE
TZAT-0018FASTVT: NEGATIVE
TZAT-0018SLOWVT: NEGATIVE
TZON-0003FASTVT: 300 ms
TZON-0003SLOWVT: 333.3 ms
TZST-0001FASTVT: 5
TZST-0001FASTVT: 6
TZST-0001FASTVT: 7
TZST-0001SLOWVT: 5
TZST-0001SLOWVT: 6
TZST-0001SLOWVT: 7
TZST-0003FASTVT: 31 J
TZST-0003FASTVT: 31 J
TZST-0003SLOWVT: 31 J
TZST-0003SLOWVT: 31 J
VF: 0

## 2011-09-05 ENCOUNTER — Ambulatory Visit (INDEPENDENT_AMBULATORY_CARE_PROVIDER_SITE_OTHER): Payer: 59 | Admitting: Internal Medicine

## 2011-09-05 ENCOUNTER — Encounter: Payer: Self-pay | Admitting: Internal Medicine

## 2011-09-05 VITALS — BP 135/85 | HR 93 | Temp 96.4°F | Resp 18 | Ht 69.0 in | Wt 197.0 lb

## 2011-09-05 DIAGNOSIS — I428 Other cardiomyopathies: Secondary | ICD-10-CM

## 2011-09-05 DIAGNOSIS — R0989 Other specified symptoms and signs involving the circulatory and respiratory systems: Secondary | ICD-10-CM

## 2011-09-05 DIAGNOSIS — F418 Other specified anxiety disorders: Secondary | ICD-10-CM

## 2011-09-05 DIAGNOSIS — R0600 Dyspnea, unspecified: Secondary | ICD-10-CM

## 2011-09-05 DIAGNOSIS — E785 Hyperlipidemia, unspecified: Secondary | ICD-10-CM

## 2011-09-05 DIAGNOSIS — F411 Generalized anxiety disorder: Secondary | ICD-10-CM | POA: Insufficient documentation

## 2011-09-05 DIAGNOSIS — Z95810 Presence of automatic (implantable) cardiac defibrillator: Secondary | ICD-10-CM

## 2011-09-05 DIAGNOSIS — J449 Chronic obstructive pulmonary disease, unspecified: Secondary | ICD-10-CM

## 2011-09-05 DIAGNOSIS — F32A Depression, unspecified: Secondary | ICD-10-CM

## 2011-09-05 DIAGNOSIS — G4700 Insomnia, unspecified: Secondary | ICD-10-CM

## 2011-09-05 DIAGNOSIS — E119 Type 2 diabetes mellitus without complications: Secondary | ICD-10-CM

## 2011-09-05 DIAGNOSIS — F329 Major depressive disorder, single episode, unspecified: Secondary | ICD-10-CM

## 2011-09-05 LAB — LIPID PANEL
Cholesterol: 223 mg/dL — ABNORMAL HIGH (ref 0–200)
HDL: 27 mg/dL — ABNORMAL LOW (ref >39)
Total CHOL/HDL Ratio: 8.3 Ratio
Triglycerides: 1292 mg/dL — ABNORMAL HIGH (ref <150)

## 2011-09-05 LAB — CBC WITH DIFFERENTIAL/PLATELET
Basophils Relative: 0 % (ref 0–1)
Eosinophils Absolute: 0.2 10*3/uL (ref 0.0–0.7)
Eosinophils Relative: 2 % (ref 0–5)
Hemoglobin: 17 g/dL (ref 13.0–17.0)
Lymphs Abs: 2 10*3/uL (ref 0.7–4.0)
MCH: 30.9 pg (ref 26.0–34.0)
MCHC: 35.1 g/dL (ref 30.0–36.0)
MCV: 88 fL (ref 78.0–100.0)
Monocytes Absolute: 0.6 10*3/uL (ref 0.1–1.0)
Monocytes Relative: 9 % (ref 3–12)
RBC: 5.5 MIL/uL (ref 4.22–5.81)

## 2011-09-05 LAB — COMPREHENSIVE METABOLIC PANEL
BUN: 15 mg/dL (ref 6–23)
CO2: 25 mEq/L (ref 19–32)
Creat: 0.88 mg/dL (ref 0.50–1.35)
Glucose, Bld: 151 mg/dL — ABNORMAL HIGH (ref 70–99)
Total Bilirubin: 0.4 mg/dL (ref 0.3–1.2)
Total Protein: 7.2 g/dL (ref 6.0–8.3)

## 2011-09-05 MED ORDER — ALPRAZOLAM 1 MG PO TABS: 1.0000 mg | ORAL_TABLET | Freq: Two times a day (BID) | ORAL | Status: DC | PRN

## 2011-09-05 MED ORDER — DESVENLAFAXINE SUCCINATE ER 50 MG PO TB24: 50.0000 mg | ORAL_TABLET | Freq: Every day | ORAL | Status: DC

## 2011-09-05 NOTE — Progress Notes (Signed)
  Subjective:    Patient ID: Justin Ray, male    DOB: [birth year], 46 y.o.   MRN: [id number]  HPI Patient Active Problem List  Diagnosis  . DM---Last A1c 8.1/times recheck  . DYSLIPIDEMIA-Last lipids normal except for triglycerides and slightly low HDL/his cardiologist has changed Lovaza to something he came to full work  . CARDIOMYOPATHY-Echo last month apparently stable  . CHF--He has had an increased shortness of breath with activity over the last month but no particular edema and no palpitations  . ICD (implantable cardiac defibrillator) in place  . Dysthymic disorder-more stable since changed her state  . COPD with asthma  . Nicotine use disorder  . Insomnia, unspecified  . Anxiety state, unspecified-Needing less medicine/off Klonopin now using Xanax twice a day     Continues to smoke at least a pack a day/has been unable or unwilling to make any changes at this point He laughs at the idea that he can actually quit smoking     Review of Systems No other new symptoms    Objective:   Physical Exam Vital signs stable good blood pressure HEENT clear No thyromegaly Heart regular without murmur or gallop at a rate of 75 No peripheral edema Lungs clear to auscultation with no wheezing on forced expiration Mood good/affect appropriate       Assessment & Plan:   1. CARDIOMYOPATHY  CBC with Differential, Comprehensive metabolic panel, ABO, Brain natriuretic peptide  2. DM  Comprehensive metabolic panel, POCT glycosylated hemoglobin (Hb A1C)  3. DYSLIPIDEMIA  Lipid panel, TSH  4. ICD (implantable cardiac defibrillator) in place  ABO--He would like to know his blood type  5. COPD with asthma    6. DOE (dyspnea on exertion)  Brain natriuretic peptide  7. Depression with anxiety  ALPRAZolam (XANAX) 1 MG tablet  8. Anxiety state, unspecified    9. Depression    10. Insomnia, unspecified     Meds ordered this encounter  Medications                . ALPRAZolam (XANAX)  1 MG tablet    Sig: Take 1 tablet (1 mg total) by mouth 2 (two) times daily as needed.    Dispense:  60 tablet    Refill:  5  . desvenlafaxine (PRISTIQ) 50 MG 24 hr tablet    Sig: Take 1 tablet (50 mg total) by mouth daily.    Dispense:  30 tablet    Refill:  5   Contact with labs any change of plan Followup 6 month

## 2011-09-06 LAB — BRAIN NATRIURETIC PEPTIDE: Brain Natriuretic Peptide: 3.9 pg/mL (ref 0.0–100.0)

## 2011-09-09 ENCOUNTER — Encounter: Payer: Self-pay | Admitting: Internal Medicine

## 2011-09-13 ENCOUNTER — Telehealth: Payer: Self-pay

## 2011-09-13 NOTE — Telephone Encounter (Signed)
Called pt to check what other meds he has tried/failed for depression in order to do prior auth for Pristiq. Pt stated he was on Effexor and ins stopped covering it - he was unable to take the generic Effexor. Called for PA and was told it was not needed. Pt had tried to fill a day early. Called pt back and advised him he can get Rx covered tomorrow. Pt agreed.  Dr Merla Riches, pt reports that he doesn't feel as if the Pristiq 50 is helping him quite enough and wondered if you thought it should be increased or if you want him to stay at this dose a little longer?

## 2011-09-14 MED ORDER — DESVENLAFAXINE SUCCINATE ER 100 MG PO TB24: 100.0000 mg | ORAL_TABLET | Freq: Every day | ORAL | Status: DC

## 2011-09-14 NOTE — Telephone Encounter (Signed)
In clinical studies the dose of 100 mg was no more effective than 50 mg. [name] is on multiple medications however in his metabolism is different so it would be possible to give him a month at 100 mg to see if he notices a difference. I couldn't tell from your note whether the insurance was going to allow [place] to go back to Effexor which worked better for him.

## 2011-09-14 NOTE — Telephone Encounter (Signed)
Sent in 1 mos of Pristiq 100 mg and LMOM explaining Dr Doolittle's message. Asked pt to CB and let us know how 100 mg works and if he needs RFs at this strength. Dr Merla Riches, ins will not cover the Effexor.

## 2011-10-13 ENCOUNTER — Other Ambulatory Visit: Payer: Self-pay | Admitting: Internal Medicine

## 2011-10-13 NOTE — Telephone Encounter (Signed)
Please call the patient. Is the 100 mg tab Pristiq working better than the 50 mg tab Pristiq?

## 2011-10-17 ENCOUNTER — Other Ambulatory Visit: Payer: Self-pay | Admitting: Radiology

## 2011-10-28 NOTE — Telephone Encounter (Signed)
Patient states could not tell a difference between Pristiq 100 mg verses the 50 mg so wants to go back on the Pristiq 50mg .

## 2011-10-28 NOTE — Telephone Encounter (Signed)
Called patient, to inquire about pristiq dose. I left message for him to call me back.

## 2011-10-29 NOTE — Telephone Encounter (Signed)
Ok to stay on 50 mg daily. It looks like he should have refills on that dose so contact pharmacy

## 2011-11-22 ENCOUNTER — Ambulatory Visit (INDEPENDENT_AMBULATORY_CARE_PROVIDER_SITE_OTHER): Payer: 59 | Admitting: *Deleted

## 2011-11-22 DIAGNOSIS — I428 Other cardiomyopathies: Secondary | ICD-10-CM

## 2011-11-22 DIAGNOSIS — Z95810 Presence of automatic (implantable) cardiac defibrillator: Secondary | ICD-10-CM

## 2011-11-24 ENCOUNTER — Other Ambulatory Visit: Payer: Self-pay | Admitting: Internal Medicine

## 2011-11-27 LAB — REMOTE ICD DEVICE
BATTERY VOLTAGE: 2.58 V
HV IMPEDENCE: 56 Ohm
RV LEAD AMPLITUDE: 11.1 mv
RV LEAD IMPEDENCE ICD: 506 Ohm
TZAT-0001FASTVT: 1
TZAT-0001FASTVT: 2
TZAT-0001SLOWVT: 1
TZAT-0013SLOWVT: 2
TZAT-0018FASTVT: NEGATIVE
TZAT-0018SLOWVT: NEGATIVE
TZON-0003FASTVT: 300 ms
TZON-0003SLOWVT: 333.3 ms
TZST-0001FASTVT: 5
TZST-0001FASTVT: 6
TZST-0001FASTVT: 7
TZST-0001SLOWVT: 6
TZST-0001SLOWVT: 7
TZST-0003FASTVT: 31 J
TZST-0003FASTVT: 31 J
TZST-0003SLOWVT: 23 J
TZST-0003SLOWVT: 31 J
VENTRICULAR PACING ICD: 0 pct

## 2011-12-04 ENCOUNTER — Encounter: Payer: Self-pay | Admitting: *Deleted

## 2011-12-17 ENCOUNTER — Encounter: Payer: Self-pay | Admitting: Internal Medicine

## 2012-01-20 ENCOUNTER — Other Ambulatory Visit: Payer: Self-pay | Admitting: Physician Assistant

## 2012-01-30 ENCOUNTER — Telehealth: Payer: Self-pay

## 2012-01-30 MED ORDER — INSULIN NPH ISOPHANE & REGULAR (70-30) 100 UNIT/ML ~~LOC~~ SUSP: SUBCUTANEOUS | Status: DC

## 2012-01-30 NOTE — Telephone Encounter (Signed)
lmom that rx was sent in and for pt to keep appt

## 2012-01-30 NOTE — Telephone Encounter (Signed)
Needs refill on Humalin 70/30mg . Needs 30 supply. Has appt on 02-27-12, but is almost out now.  [facility] in [place]

## 2012-02-12 ENCOUNTER — Ambulatory Visit (INDEPENDENT_AMBULATORY_CARE_PROVIDER_SITE_OTHER): Payer: 59 | Admitting: Internal Medicine

## 2012-02-12 ENCOUNTER — Encounter: Payer: Self-pay | Admitting: Internal Medicine

## 2012-02-12 VITALS — BP 147/90 | HR 71

## 2012-02-12 DIAGNOSIS — I509 Heart failure, unspecified: Secondary | ICD-10-CM

## 2012-02-12 DIAGNOSIS — I428 Other cardiomyopathies: Secondary | ICD-10-CM

## 2012-02-12 DIAGNOSIS — Z95810 Presence of automatic (implantable) cardiac defibrillator: Secondary | ICD-10-CM

## 2012-02-12 LAB — ICD DEVICE OBSERVATION
BRDY-0002RV: 40 {beats}/min
HV IMPEDENCE: 52 Ohm
RV LEAD AMPLITUDE: 7.9 mv
RV LEAD IMPEDENCE ICD: 489 Ohm
TZAT-0001SLOWVT: 2
TZAT-0018FASTVT: NEGATIVE
TZAT-0018SLOWVT: NEGATIVE
TZON-0003FASTVT: 300 ms
TZON-0003SLOWVT: 333.3 ms
TZST-0001FASTVT: 3
TZST-0001FASTVT: 6
TZST-0001FASTVT: 7
TZST-0001SLOWVT: 6
TZST-0001SLOWVT: 7
TZST-0003FASTVT: 31 J
TZST-0003FASTVT: 31 J
TZST-0003SLOWVT: 23 J
TZST-0003SLOWVT: 31 J
TZST-0003SLOWVT: 31 J
VENTRICULAR PACING ICD: 0 pct

## 2012-02-12 MED ORDER — DIGOXIN 125 MCG PO TABS: 125.0000 ug | ORAL_TABLET | Freq: Every day | ORAL | Status: DC

## 2012-02-12 NOTE — Assessment & Plan Note (Signed)
His Medtronic ICD is working normally. We'll plan to recheck in several months. 

## 2012-02-12 NOTE — Assessment & Plan Note (Signed)
His chronic systolic heart failure is well compensated, class II. He will continue his current level activity, and maintain a low-sodium diet, and continue his current medical therapy.

## 2012-02-12 NOTE — Patient Instructions (Signed)
Your physician wants you to follow-up in: 12 months with Dr Court Joy will receive a reminder letter in the mail two months in advance. If you don't receive a letter, please call our office to schedule the follow-up appointment.   Remote monitoring is used to monitor your Pacemaker of ICD from home. This monitoring reduces the number of office visits required to check your device to one time per year. It allows [place] to keep an eye on the functioning of your device to ensure it is working properly. You are scheduled for a device check from home on 05/15/12. You may send your transmission at any time that day. If you have a wireless device, the transmission will be sent automatically. After your physician reviews your transmission, you will receive a postcard with your next transmission date.

## 2012-02-12 NOTE — Progress Notes (Signed)
HPI [name] returns today for followup. He is a very pleasant 47 year old man with chronic systolic heart failure, class II, a nonischemic cardio myopathy, status post ICD implantation. In the interim, he has done well. He continues to work full-time and denies chest pain or shortness of breath. He has had problems with his insulin regimen. Allergies  Allergen Reactions  . Codeine Other (See Comments)    Severe HA's.  . Niaspan (Niacin Er) Other (See Comments)    Per pt feels like skin is burning( REDNESS), and pins/needles are sticking   . Sulfonamide Derivatives Other (See Comments)    thrush     Current Outpatient Prescriptions  Medication Sig Dispense Refill  . ALPRAZolam (XANAX) 1 MG tablet Take 1 tablet (1 mg total) by mouth 2 (two) times daily as needed.  60 tablet  5  . aspirin 81 MG tablet Take 160 mg by mouth daily.      . B-D INS SYR ULTRAFINE 1CC/31G 31G X 5/16" 1 ML MISC       . carvedilol (COREG) 25 MG tablet Take 50 mg by mouth 2 (two) times daily with a meal.      . clonazePAM (KLONOPIN) 1 MG tablet Take 2 mg by mouth as needed.       . desvenlafaxine (PRISTIQ) 50 MG 24 hr tablet Take 1 tablet (50 mg total) by mouth daily.  30 tablet  5  . digoxin (LANOXIN) 0.125 MG tablet Take 1 tablet (125 mcg total) by mouth daily.  90 tablet  3  . fenofibrate (TRICOR) 145 MG tablet Take 145 mg by mouth daily.      . furosemide (LASIX) 40 MG tablet Take 40 mg by mouth as needed.      . insulin NPH-insulin regular (HUMULIN 70/30) (70-30) 100 UNIT/ML injection Inject 65 units each morning and 45 units each evening  10 mL  0  . lisinopril (PRINIVIL,ZESTRIL) 20 MG tablet Take 20 mg by mouth 2 (two) times daily.      . mupirocin ointment (BACTROBAN) 2 %       . Needles & Syringes MISC 65 units am, 45 units pm  100 each  11  . omega-3 acid ethyl esters (LOVAZA) 1 G capsule Take 2 g by mouth 2 (two) times daily.      . rosuvastatin (CRESTOR) 10 MG tablet Take 10 mg by mouth daily.           Past Medical History  Diagnosis Date  . CHF (congestive heart failure)   . Cardiomyopathy   . Dyslipidemia   . Diabetes mellitus     ROS:   All systems reviewed and negative except as noted in the HPI.   Past Surgical History  Procedure Date  . Cardiac defibrillator placement 02/01/2004  . Tonsillectomy   . Eye surgery      Family History  Problem Relation Age of Onset  . Cancer    . Coronary artery disease Father     CABG     History   Social History  . Marital Status: Widowed    Spouse Name: N/A    Number of Children: 0  . Years of Education: N/A   Occupational History  .  [employer]   Social History Main Topics  . Smoking status: Current Every Day Smoker -- 1.5 packs/day    Types: Cigarettes  . Smokeless tobacco: Not on file  . Alcohol Use: No  . Drug Use: No  . Sexually  Active: Not on file   Other Topics Concern  . Not on file   Social History Narrative  . No narrative on file     BP 147/90  Pulse 71  Physical Exam:  Well appearing 47 year old man, NAD HEENT: Unremarkable Neck:  7 cm JVD, no thyromegally Lungs:  Clear with no wheezes, rales, or rhonchi. HEART:  Regular rate rhythm, no murmurs, no rubs, no clicks Abd:  soft, positive bowel sounds, no organomegally, no rebound, no guarding Ext:  2 plus pulses, no edema, no cyanosis, no clubbing Skin:  No rashes no nodules Neuro:  CN II through XII intact, motor grossly intact  EKG normal sinus rhythm with LVH  DEVICE  Normal device function.  See PaceArt for details.   Assess/Plan:

## 2012-02-13 ENCOUNTER — Telehealth: Payer: Self-pay

## 2012-02-13 NOTE — Telephone Encounter (Signed)
PT SAYS [facility]LE HAD FILLED HIS INSULIN.  HE SAYS HE WAS SUPPOSED TO HAVE A 30 DAY SUPPLY, BUT ONLY GOT A 10 DAY SUPPLY.  HE HAS AN APPOINTMENT COMING UP IN February, BUT NEEDS ENOUGH TO LAST UNTIL THEN.  SAYS WE KEEP MESSING UP HIS REFILLS AND THAT EVERY TIME HE GETS IT FILLED THERE IS A DIFFERENT DOCTOR'S NAME ON THE BOTTLE.  HE CAN NOT AFFORD TO KEEP GETTING THIS REFILLS, WHEN THEY ARE NOT A 30-DAY SUPPLY LIKE THEY ARE SUPPOSED TO BE.   PLEASE CALL HIM ASAP TO VERIFY WHAT HIS MEDICINE SHOULD CONSIST OF AND PLEASE REFILL.  [phone] (CELL) [phone] (HOME)

## 2012-02-14 MED ORDER — INSULIN NPH ISOPHANE & REGULAR (70-30) 100 UNIT/ML ~~LOC~~ SUSP: SUBCUTANEOUS | Status: DC

## 2012-02-14 NOTE — Telephone Encounter (Signed)
Sent to pharmacy, encourage pt to keep appt

## 2012-02-14 NOTE — Telephone Encounter (Signed)
We sent in supply until he could get in, now he does have appt scheduled, ? Okay for 1 mo supply? Pended Amy

## 2012-02-14 NOTE — Telephone Encounter (Signed)
Left message for patient to advise

## 2012-02-20 ENCOUNTER — Ambulatory Visit: Payer: 59 | Admitting: Internal Medicine

## 2012-02-22 ENCOUNTER — Encounter: Payer: Self-pay | Admitting: Internal Medicine

## 2012-02-27 ENCOUNTER — Ambulatory Visit (INDEPENDENT_AMBULATORY_CARE_PROVIDER_SITE_OTHER): Payer: 59 | Admitting: Internal Medicine

## 2012-02-27 ENCOUNTER — Encounter: Payer: Self-pay | Admitting: Internal Medicine

## 2012-02-27 VITALS — BP 140/86 | HR 83 | Temp 98.4°F | Resp 16 | Ht 68.0 in | Wt 201.6 lb

## 2012-02-27 DIAGNOSIS — J449 Chronic obstructive pulmonary disease, unspecified: Secondary | ICD-10-CM

## 2012-02-27 DIAGNOSIS — F39 Unspecified mood [affective] disorder: Secondary | ICD-10-CM

## 2012-02-27 DIAGNOSIS — F418 Other specified anxiety disorders: Secondary | ICD-10-CM

## 2012-02-27 DIAGNOSIS — R4586 Emotional lability: Secondary | ICD-10-CM

## 2012-02-27 DIAGNOSIS — F17200 Nicotine dependence, unspecified, uncomplicated: Secondary | ICD-10-CM

## 2012-02-27 DIAGNOSIS — G4700 Insomnia, unspecified: Secondary | ICD-10-CM

## 2012-02-27 DIAGNOSIS — F411 Generalized anxiety disorder: Secondary | ICD-10-CM

## 2012-02-27 DIAGNOSIS — E785 Hyperlipidemia, unspecified: Secondary | ICD-10-CM

## 2012-02-27 DIAGNOSIS — E119 Type 2 diabetes mellitus without complications: Secondary | ICD-10-CM

## 2012-02-27 DIAGNOSIS — F341 Dysthymic disorder: Secondary | ICD-10-CM

## 2012-02-27 LAB — CBC
MCH: 30.4 pg (ref 26.0–34.0)
MCHC: 35.3 g/dL (ref 30.0–36.0)
MCV: 86.2 fL (ref 78.0–100.0)
Platelets: 171 10*3/uL (ref 150–400)
RDW: 13.6 % (ref 11.5–15.5)
WBC: 6.1 10*3/uL (ref 4.0–10.5)

## 2012-02-27 MED ORDER — ALPRAZOLAM 1 MG PO TABS: 1.0000 mg | ORAL_TABLET | Freq: Two times a day (BID) | ORAL | Status: DC | PRN

## 2012-02-27 MED ORDER — CLONAZEPAM 1 MG PO TABS: 2.0000 mg | ORAL_TABLET | ORAL | Status: DC | PRN

## 2012-02-27 MED ORDER — INSULIN NPH ISOPHANE & REGULAR (70-30) 100 UNIT/ML ~~LOC~~ SUSP: SUBCUTANEOUS | Status: DC

## 2012-02-27 MED ORDER — DESVENLAFAXINE SUCCINATE ER 50 MG PO TB24: 50.0000 mg | ORAL_TABLET | Freq: Every day | ORAL | Status: DC

## 2012-02-27 NOTE — Progress Notes (Signed)
  Subjective:    Patient ID: Justin Ray, male    DOB: 1965-05-19, 47 y.o.   MRN: [id number]  HPI  Patient Active Problem List  Diagnosis  . DM-has been having trouble getting his insulin refilled through our call in//his control has been variable   . [facility] cardiologist has been having trouble adjusting for triglycerides with affordable medication   .  cardiomyopathy -stable symptoms   . CHF stable symptoms   . ICD (implantable cardiac defibrillator) in place-see Dr. Lubertha Basque notes   . Dysthymic disorder-this has been his biggest concern. It is now 5 years since his wife's death. He still has not recovered from his grief. He has no life outside of work. He continues to say goodbye to her when he leaves and hello when he gets home. His time away from work is mainly spent working on the house. He still drives her car. He now has had experiences that seem hallucinatory on many occasions. One disturbing thing has been dreaming that he is looking at himself in a casket.   Marland Kitchen COPD with asthma  . Nicotine use disorder-continues to smoke   . Insomnia, unspecified//sleep seems nonrestorative and somewhat disturbed /  . Anxiety state, unspecified    Review of Systems No headaches or change in vision No chest pain or palpitations ICD has not discharged No wheezing No neuropathy symptoms    Objective:   Physical Exam BP 140/86  Pulse 83  Temp(Src) 98.4 F (36.9 C) (Oral)  Resp 16  Ht 5\' 8"  (1.727 m)  Wt 201 lb 9.6 oz (91.445 kg)  BMI 30.66 kg/m2  SpO2 96% No acute distress other than anxious HEENT clear Heart regular without murmur No distal sensory losses Oriented to time person and place Cranial nerves intact       Results for orders placed in visit on 02/27/12  POCT GLYCOSYLATED HEMOGLOBIN (HGB A1C)      Result Value Range   Hemoglobin A1C 9.0      Assessment & Plan:   DM  DYSLIPIDEMIA Cardiomyopathy with congestive heart failure/ICD  COPD with asthma  /Nicotine use disorder  Insomnia, unspecified Depression with anxiety New psychological symptoms that border on psychosis  Meds ordered this encounter///cardiac-lipid meds supplied by cardiologist   Medications  . insulin NPH-insulin regular (HUMULIN 70/30) (70-30) 100 UNIT/ML injection    Sig: Inject 65 units each morning and 45 units each evening. Dispense 30 day supply or more if allowed. This prescription should cover one year.    Dispense:  30 mL    Refill:  11  . desvenlafaxine (PRISTIQ) 50 MG 24 hr tablet    Sig: Take 1 tablet (50 mg total) by mouth daily.    Dispense:  30 tablet    Refill:  5  . clonazePAM (KLONOPIN) 1 MG tablet    Sig: Take 2 tablets (2 mg total) by mouth as needed for sleep     Dispense:  30 tablet    Refill:  5  . ALPRAZolam (XANAX) 1 MG tablet    Sig: Take 1 tablet (1 mg total) by mouth 2 (two) times daily as needed for daytime anxiety     Dispense:  60 tablet    Refill:  5   He needs a psychiatric consult Lab results to his cardiologist Recheck here in 3 months-6 months

## 2012-02-28 ENCOUNTER — Encounter: Payer: Self-pay | Admitting: Internal Medicine

## 2012-02-28 LAB — LIPID PANEL
HDL: 32 mg/dL — ABNORMAL LOW (ref >39)
Total CHOL/HDL Ratio: 2.7 Ratio
VLDL: 56 mg/dL — ABNORMAL HIGH (ref 0–40)

## 2012-02-28 LAB — COMPREHENSIVE METABOLIC PANEL
ALT: 64 U/L — ABNORMAL HIGH (ref 0–53)
AST: 32 U/L (ref 0–37)
Alkaline Phosphatase: 70 U/L (ref 39–117)
Chloride: 104 mEq/L (ref 96–112)
Creat: 0.98 mg/dL (ref 0.50–1.35)
Total Bilirubin: 0.6 mg/dL (ref 0.3–1.2)

## 2012-03-03 ENCOUNTER — Other Ambulatory Visit: Payer: Self-pay | Admitting: *Deleted

## 2012-03-03 ENCOUNTER — Encounter: Payer: Self-pay | Admitting: Internal Medicine

## 2012-03-03 ENCOUNTER — Telehealth: Payer: Self-pay | Admitting: Radiology

## 2012-03-03 MED ORDER — GLUCOSE BLOOD VI STRP: ORAL_STRIP | Status: DC

## 2012-03-03 MED ORDER — ACCU-CHEK SOFTCLIX LANCETS MISC: Status: DC

## 2012-03-03 NOTE — Telephone Encounter (Signed)
Error

## 2012-03-03 NOTE — Telephone Encounter (Signed)
Patient called ,states he is returning a call he is doing well and does not require a call back, I see no documentation we have tried to call him. Advised him to listen to message and call back once he determines who called him. Amy

## 2012-03-04 NOTE — Progress Notes (Signed)
Left msg for pt to schedule 3 month f-up with Dr. Merla Riches.

## 2012-03-05 NOTE — Progress Notes (Signed)
Appt made with pt for 05/21/12.

## 2012-05-11 ENCOUNTER — Other Ambulatory Visit: Payer: Self-pay | Admitting: Internal Medicine

## 2012-05-15 ENCOUNTER — Ambulatory Visit (INDEPENDENT_AMBULATORY_CARE_PROVIDER_SITE_OTHER): Payer: 59 | Admitting: *Deleted

## 2012-05-15 DIAGNOSIS — Z95810 Presence of automatic (implantable) cardiac defibrillator: Secondary | ICD-10-CM

## 2012-05-15 DIAGNOSIS — I428 Other cardiomyopathies: Secondary | ICD-10-CM

## 2012-05-21 ENCOUNTER — Ambulatory Visit: Payer: 59

## 2012-05-21 ENCOUNTER — Ambulatory Visit (INDEPENDENT_AMBULATORY_CARE_PROVIDER_SITE_OTHER): Payer: 59 | Admitting: Internal Medicine

## 2012-05-21 ENCOUNTER — Encounter: Payer: Self-pay | Admitting: Internal Medicine

## 2012-05-21 VITALS — BP 125/79 | HR 73 | Temp 97.0°F | Resp 17 | Wt 198.0 lb

## 2012-05-21 DIAGNOSIS — E119 Type 2 diabetes mellitus without complications: Secondary | ICD-10-CM

## 2012-05-21 DIAGNOSIS — E785 Hyperlipidemia, unspecified: Secondary | ICD-10-CM

## 2012-05-21 DIAGNOSIS — M5412 Radiculopathy, cervical region: Secondary | ICD-10-CM

## 2012-05-21 DIAGNOSIS — M542 Cervicalgia: Secondary | ICD-10-CM

## 2012-05-21 MED ORDER — CYCLOBENZAPRINE HCL 10 MG PO TABS: 10.0000 mg | ORAL_TABLET | Freq: Every day | ORAL | Status: DC
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
## 2012-05-22 ENCOUNTER — Other Ambulatory Visit: Payer: Self-pay | Admitting: Internal Medicine

## 2012-05-22 LAB — COMPREHENSIVE METABOLIC PANEL
Albumin: 4.8 g/dL (ref 3.5–5.2)
Alkaline Phosphatase: 66 U/L (ref 39–117)
BUN: 15 mg/dL (ref 6–23)
CO2: 27 mEq/L (ref 19–32)
Glucose, Bld: 158 mg/dL — ABNORMAL HIGH (ref 70–99)
Total Bilirubin: 0.8 mg/dL (ref 0.3–1.2)
Total Protein: 7.5 g/dL (ref 6.0–8.3)

## 2012-05-22 LAB — CBC WITH DIFFERENTIAL/PLATELET
Basophils Relative: 0 % (ref 0–1)
Eosinophils Absolute: 0.1 10*3/uL (ref 0.0–0.7)
Eosinophils Relative: 2 % (ref 0–5)
HCT: 51.5 % (ref 39.0–52.0)
Hemoglobin: 18.5 g/dL — ABNORMAL HIGH (ref 13.0–17.0)
Lymphs Abs: 2.1 10*3/uL (ref 0.7–4.0)
MCH: 31.7 pg (ref 26.0–34.0)
MCHC: 35.9 g/dL (ref 30.0–36.0)
MCV: 88.3 fL (ref 78.0–100.0)
Monocytes Absolute: 0.7 10*3/uL (ref 0.1–1.0)
Monocytes Relative: 8 % (ref 3–12)
RBC: 5.83 MIL/uL — ABNORMAL HIGH (ref 4.22–5.81)

## 2012-05-22 LAB — LIPID PANEL
Cholesterol: 85 mg/dL (ref 0–200)
HDL: 32 mg/dL — ABNORMAL LOW (ref >39)
LDL Cholesterol: 11 mg/dL (ref 0–99)
Triglycerides: 209 mg/dL — ABNORMAL HIGH (ref <150)
VLDL: 42 mg/dL — ABNORMAL HIGH (ref 0–40)

## 2012-05-26 ENCOUNTER — Encounter: Payer: Self-pay | Admitting: Internal Medicine

## 2012-06-09 LAB — REMOTE ICD DEVICE
BRDY-0002RV: 40 {beats}/min
CHARGE TIME: 15.1 s
DEV-0020ICD: NEGATIVE
RV LEAD AMPLITUDE: 8.5 mv
RV LEAD IMPEDENCE ICD: 489 Ohm
TOT-0006: 20131107000000
TZAT-0001FASTVT: 2
TZAT-0001SLOWVT: 2
TZAT-0002SLOWVT: NEGATIVE
TZAT-0013FASTVT: 2
TZAT-0013SLOWVT: 2
TZAT-0018FASTVT: NEGATIVE
TZON-0003FASTVT: 300 ms
TZON-0003SLOWVT: 333.3 ms
TZST-0001FASTVT: 6
TZST-0001FASTVT: 7
TZST-0001SLOWVT: 4
TZST-0001SLOWVT: 5
TZST-0003FASTVT: 31 J
TZST-0003FASTVT: 31 J
TZST-0003SLOWVT: 23 J
TZST-0003SLOWVT: 31 J
TZST-0003SLOWVT: 31 J

## 2012-06-19 ENCOUNTER — Encounter: Payer: Self-pay | Admitting: Internal Medicine

## 2012-07-28 ENCOUNTER — Telehealth: Payer: Self-pay

## 2012-07-28 DIAGNOSIS — I509 Heart failure, unspecified: Secondary | ICD-10-CM

## 2012-07-28 NOTE — Telephone Encounter (Signed)
Open chart in error

## 2012-08-21 ENCOUNTER — Encounter: Payer: 59 | Admitting: *Deleted

## 2012-08-27 ENCOUNTER — Encounter: Payer: Self-pay | Admitting: Internal Medicine

## 2012-08-27 ENCOUNTER — Ambulatory Visit (INDEPENDENT_AMBULATORY_CARE_PROVIDER_SITE_OTHER): Payer: 59 | Admitting: Internal Medicine

## 2012-08-27 VITALS — BP 130/82 | HR 71 | Temp 97.8°F | Resp 16 | Ht 68.0 in | Wt 193.0 lb

## 2012-08-27 DIAGNOSIS — IMO0001 Reserved for inherently not codable concepts without codable children: Secondary | ICD-10-CM

## 2012-08-27 DIAGNOSIS — M5412 Radiculopathy, cervical region: Secondary | ICD-10-CM

## 2012-08-27 DIAGNOSIS — E785 Hyperlipidemia, unspecified: Secondary | ICD-10-CM

## 2012-08-27 DIAGNOSIS — E119 Type 2 diabetes mellitus without complications: Secondary | ICD-10-CM

## 2012-08-27 DIAGNOSIS — F17200 Nicotine dependence, unspecified, uncomplicated: Secondary | ICD-10-CM

## 2012-08-27 DIAGNOSIS — G4700 Insomnia, unspecified: Secondary | ICD-10-CM

## 2012-08-27 LAB — COMPREHENSIVE METABOLIC PANEL
ALT: 54 U/L — ABNORMAL HIGH (ref 0–53)
Albumin: 4.2 g/dL (ref 3.5–5.2)
Alkaline Phosphatase: 63 U/L (ref 39–117)
CO2: 23 mEq/L (ref 19–32)
Glucose, Bld: 124 mg/dL — ABNORMAL HIGH (ref 70–99)
Potassium: 4.3 mEq/L (ref 3.5–5.3)
Sodium: 140 mEq/L (ref 135–145)
Total Protein: 6.7 g/dL (ref 6.0–8.3)

## 2012-08-27 LAB — LIPID PANEL
LDL Cholesterol: 13 mg/dL (ref 0–99)
Triglycerides: 175 mg/dL — ABNORMAL HIGH (ref <150)

## 2012-08-27 NOTE — Addendum Note (Signed)
Addended by: Tonye Pearson on: 08/27/2012 10:37 PM   Modules accepted: Orders

## 2012-08-27 NOTE — Progress Notes (Addendum)
  Subjective:    Patient ID: Justin Ray, male    DOB: March 04, 1965, 47 y.o.   MRN: [id number]  HPI  Patient Active Problem List   Diagnosis Date Noted  . DM 01/26/2009    Priority: High  . CARDIOMYOPATHY 01/26/2009    Priority: High  . CHF 01/26/2009    Priority: High  . Dysthymic disorder 02/28/2011    Priority: Medium  . ICD (implantable cardiac defibrillator) in place 02/06/2011    Priority: Medium  . DYSLIPIDEMIA 01/26/2009    Priority: Medium  . Anxiety state, unspecified 09/05/2011  . COPD with asthma 02/28/2011  . Nicotine use disorder 02/28/2011  . Insomnia, unspecified 02/28/2011   Patient reports everything is about the same. Increased insulin dose at last visit. Significant decrease in triglycerides. Patient participating in diabetes program through work insurance which has decreased his costs for meds/supplies.  Continues to have neck pain, somewhat improved. Three fingers on right hand feel as though they have been rubbed on sand paper. Makes work difficult. Has difficulty turning head to right. Has had xray, but no further testing or consultation for neck pain.   Morning blood sugars 100 to 130/not checking evening blood sugars Chantix produced vivid dreams but did not produce cigarettes to the point of quitting  Review of Systems No fever chills or night sweats No weight loss No chest pain or palpitations No shortness of breath Insomnia and anxiety disorder stable No increased dysthymia    Objective:   Physical Exam BP 130/82  Pulse 71  Temp(Src) 97.8 F (36.6 C) (Oral)  Resp 16  Ht 5\' 8"  (1.727 m)  Wt 193 lb (87.544 kg)  BMI 29.35 kg/m2  SpO2 96% HEENT clear Heart regular without murmur Lungs clear No carotid bruits No peripheral edema No peripheral sensory losses except decreased sensation in the right hand first 2 fingers Has decreased finger thumb opposition Neck range of motion decreased to the right due to pain   Results for orders  placed in visit on 08/27/12  POCT GLYCOSYLATED HEMOGLOBIN (HGB A1C)      Result Value Range   Hemoglobin A1C 8.7          Assessment & Plan:  DM uncontrolled- Plan: CBC with Differential, Comprehensive metabolic panel,  2 AM 5 units of insulin to the morning dose if evening blood sugars are over 130 and likewise to  the evening dose if morning blood sugars over 130  DYSLIPIDEMIA - Plan: Lipid panel///he has responded to the increase in therapy by his cardiologist  Nicotine use disorder-unable to quit  Insomnia, unspecified/dysthymia/anxiety-all stable at this point  Cervical radiculopathy on the right-prior x-rays revealed disc space narrowing  We'll refer to his orthopedist Dr.Beane  Check labs/followup 3 months/labs to Dr Aliene Beams

## 2012-08-28 LAB — CBC WITH DIFFERENTIAL/PLATELET
Basophils Relative: 0 % (ref 0–1)
Hemoglobin: 17.1 g/dL — ABNORMAL HIGH (ref 13.0–17.0)
Lymphs Abs: 1.8 10*3/uL (ref 0.7–4.0)
Monocytes Relative: 8 % (ref 3–12)
Neutro Abs: 4.2 10*3/uL (ref 1.7–7.7)
Neutrophils Relative %: 64 % (ref 43–77)
RBC: 5.43 MIL/uL (ref 4.22–5.81)

## 2012-08-30 ENCOUNTER — Telehealth: Payer: Self-pay

## 2012-08-30 ENCOUNTER — Encounter: Payer: Self-pay | Admitting: Internal Medicine

## 2012-08-30 NOTE — Telephone Encounter (Signed)
Faxed labs to Dr. Tomie China in [place] at [phone]

## 2012-09-03 ENCOUNTER — Telehealth: Payer: Self-pay

## 2012-09-03 NOTE — Telephone Encounter (Signed)
Patient calling to get a copy of his x-rays of his neck. He has been referred by [place] to go to [facility] (they have requested his xrays on disc). Patient states if he can get them today, that would be ideal because he works in [employer] forces operational [employer] and lives else where.   If ready today please call his cell phone: [phone]. Or if not able to be done today please leave a voicemail on his machine; home: [phone]

## 2012-09-04 NOTE — Telephone Encounter (Signed)
Called pt, advised Xray ready to pick up. LMOM

## 2012-09-11 ENCOUNTER — Ambulatory Visit (INDEPENDENT_AMBULATORY_CARE_PROVIDER_SITE_OTHER): Payer: 59 | Admitting: *Deleted

## 2012-09-11 DIAGNOSIS — I428 Other cardiomyopathies: Secondary | ICD-10-CM

## 2012-09-11 DIAGNOSIS — Z95810 Presence of automatic (implantable) cardiac defibrillator: Secondary | ICD-10-CM

## 2012-09-12 LAB — REMOTE ICD DEVICE
BRDY-0002RV: 40 {beats}/min
DEVICE MODEL ICD: 104670
RV LEAD AMPLITUDE: 7.8 mv
TZAT-0001FASTVT: 2
TZAT-0001SLOWVT: 1
TZAT-0001SLOWVT: 2
TZAT-0002FASTVT: NEGATIVE
TZAT-0002SLOWVT: NEGATIVE
TZAT-0013FASTVT: 2
TZAT-0013SLOWVT: 2
TZAT-0018FASTVT: NEGATIVE
TZAT-0018SLOWVT: NEGATIVE
TZON-0003FASTVT: 300 ms
TZON-0003SLOWVT: 333.3 ms
TZST-0001FASTVT: 4
TZST-0001FASTVT: 5
TZST-0001FASTVT: 7
TZST-0001SLOWVT: 4
TZST-0001SLOWVT: 5
TZST-0003FASTVT: 31 J
TZST-0003FASTVT: 31 J
TZST-0003FASTVT: 31 J
TZST-0003SLOWVT: 23 J
TZST-0003SLOWVT: 31 J
TZST-0003SLOWVT: 31 J
VENTRICULAR PACING ICD: 0 pct

## 2012-10-07 ENCOUNTER — Other Ambulatory Visit: Payer: Self-pay | Admitting: Internal Medicine

## 2012-10-08 ENCOUNTER — Encounter: Payer: Self-pay | Admitting: *Deleted

## 2012-10-15 DIAGNOSIS — G5600 Carpal tunnel syndrome, unspecified upper limb: Secondary | ICD-10-CM

## 2012-10-15 HISTORY — DX: Carpal tunnel syndrome, unspecified upper limb: G56.00

## 2012-10-16 ENCOUNTER — Encounter: Payer: Self-pay | Admitting: Internal Medicine

## 2012-10-27 ENCOUNTER — Telehealth: Payer: Self-pay | Admitting: *Deleted

## 2012-10-27 NOTE — Telephone Encounter (Signed)
Pt's device beeping since 10/24/12. He sent a home transmission, confirmed ERI/2.57V reached.  ROV w/ Brooke 11/11/12 @ 8:30

## 2012-11-11 ENCOUNTER — Encounter: Payer: Self-pay | Admitting: Cardiology

## 2012-11-11 ENCOUNTER — Encounter: Payer: Self-pay | Admitting: *Deleted

## 2012-11-11 ENCOUNTER — Ambulatory Visit (INDEPENDENT_AMBULATORY_CARE_PROVIDER_SITE_OTHER): Payer: 59 | Admitting: Cardiology

## 2012-11-11 VITALS — BP 128/88 | HR 75 | Ht 68.0 in | Wt 195.8 lb

## 2012-11-11 DIAGNOSIS — I428 Other cardiomyopathies: Secondary | ICD-10-CM

## 2012-11-11 DIAGNOSIS — I5022 Chronic systolic (congestive) heart failure: Secondary | ICD-10-CM

## 2012-11-11 DIAGNOSIS — Z95810 Presence of automatic (implantable) cardiac defibrillator: Secondary | ICD-10-CM

## 2012-11-11 DIAGNOSIS — Z4502 Encounter for adjustment and management of automatic implantable cardiac defibrillator: Secondary | ICD-10-CM

## 2012-11-11 NOTE — Progress Notes (Signed)
 ELECTROPHYSIOLOGY OFFICE NOTE  Patient ID: Justin Ray MRN: [id number], DOB/AGE: 49/24/1967   Date of Visit: 11/11/2012  Primary Physician: DOOLITTLE, ROBERT P, MD Primary Cardiologist: Gregg Taylor, MD Reason for Visit: EP/device follow-up  History of Present Illness  Justin Ray is a 47 y.o. male with a NICM s/p ICD implant for primary prevention of SCD who presents today for routine electrophysiology followup. His ICD battery is at ERI.  Since last being seen in our clinic, he reports he is doing well and has no complaints. He denies chest pain or shortness of breath. He denies palpitations, dizziness, near syncope or syncope. He denies LE swelling, orthopnea, PND or recent weight gain. He is compliant and tolerating medications without difficulty.  Past Medical History Past Medical History  Diagnosis Date  . CHF (congestive heart failure)   . Cardiomyopathy   . Dyslipidemia   . Diabetes mellitus   . Anxiety   . Hypertension   . Cataract     Past Surgical History Past Surgical History  Procedure Laterality Date  . Cardiac defibrillator placement  02/01/2004  . Tonsillectomy    . Eye surgery      Allergies/Intolerances Allergies  Allergen Reactions  . Codeine Other (See Comments)    Severe HA's.  . Niaspan [Niacin Er] Other (See Comments)    Per pt feels like skin is burning( REDNESS), and pins/needles are sticking   . Sulfonamide Derivatives Other (See Comments)    thrush    Current Home Medications Current Outpatient Prescriptions  Medication Sig Dispense Refill  . ACCU-CHEK SOFTCLIX LANCETS lancets Check glucose twice daily as instructed. Dx. Code 250.00  100 each  2  . ALPRAZolam (XANAX) 1 MG tablet Take 1 tablet (1 mg total) by mouth 2 (two) times daily as needed.  60 tablet  5  . Ascorbic Acid (VITAMIN C) 1000 MG tablet Take 1,000 mg by mouth daily.      . aspirin 81 MG tablet Take 81 mg by mouth daily.       . B-D INS SYR ULTRAFINE 1CC/31G 31G X  5/16" 1 ML MISC       . BD INSULIN SYRINGE ULTRAFINE 31G X 15/64" 1 ML MISC USE AS DIRECTED  100 each  10  . carvedilol (COREG) 25 MG tablet Take 25 mg by mouth 2 (two) times daily with a meal.       . clonazePAM (KLONOPIN) 1 MG tablet Take 2 tablets (2 mg total) by mouth as needed.  30 tablet  5  . cyclobenzaprine (FLEXERIL) 10 MG tablet Take 1 tablet (10 mg total) by mouth at bedtime.  30 tablet  0  . digoxin (LANOXIN) 0.125 MG tablet Take 1 tablet (125 mcg total) by mouth daily.  90 tablet  3  . furosemide (LASIX) 40 MG tablet Take 40 mg by mouth as needed.      . glucose blood (ACCU-CHEK AVIVA PLUS) test strip Check glucose twice daily as instructed. Dx. Code 250.00  100 each  2  . insulin NPH-insulin regular (HUMULIN 70/30) (70-30) 100 UNIT/ML injection Inject 65 units each morning and 45 units each evening. Dispense 30 day supply or more if allowed. This prescription should cover one year.  30 mL  11  . lisinopril (PRINIVIL,ZESTRIL) 20 MG tablet Take 20 mg by mouth 2 (two) times daily.      . Needles & Syringes MISC 65 units am, 45 units pm  100 each  11  . omega-3 acid ethyl esters (  LOVAZA) 1 G capsule Take 2 g by mouth 2 (two) times daily.      . PRISTIQ 50 MG 24 hr tablet take 1 tablet by mouth once daily  30 tablet  5  . Pyridoxine HCl (VITAMIN B-6) 250 MG tablet Take 250 mg by mouth daily.      . rosuvastatin (CRESTOR) 10 MG tablet Take 10 mg by mouth daily.       No current facility-administered medications for this visit.    Social History Social History  . Marital Status: Widowed   Occupational History  . maintenance [employer]   Social History Main Topics  . Smoking status: Current Every Day Smoker -- 1.50 packs/day    Types: Cigarettes  . Smokeless tobacco: No  . Alcohol Use: No  . Drug Use: No   Review of Systems General: No chills, fever, night sweats or weight changes Cardiovascular: No chest pain, dyspnea on exertion, edema, orthopnea, palpitations,  paroxysmal nocturnal dyspnea Dermatological: No rash, lesions or masses Respiratory: No cough, dyspnea Urologic: No hematuria, dysuria Abdominal: No nausea, vomiting, diarrhea, bright red blood per rectum, melena, or hematemesis Neurologic: No visual changes, weakness, changes in mental status All other systems reviewed and are otherwise negative except as noted above.  Physical Exam Vitals: Blood pressure 128/88, pulse 75, height 5' 8" (1.727 m), weight 195 lb 12.8 oz (88.814 kg), SpO2 98.00%.  General: Well developed, well appearing 47 y.o. male in no acute distress. HEENT: Normocephalic, atraumatic. EOMs intact. Sclera nonicteric. Oropharynx clear.  Neck: Supple without bruits. No JVD. Lungs: Respirations regular and unlabored, CTA bilaterally. No wheezes, rales or rhonchi. Heart: RRR. S1, S2 present. No murmurs, rub, S3 or S4. Abdomen: Soft, non-distended.  Extremities: No clubbing, cyanosis or edema. PT/Radials 2+ and equal bilaterally. Psych: Normal affect. Neuro: Alert and oriented X 3. Moves all extremities spontaneously. Skin: ICD implant site is well healed.   Diagnostics Device interrogation - recent check - battery at ERI since 10/24/2012, V pacing 0%; histograms appropriate  Assessment and Plan 1. NICM s/p ICD implant for primary prevention SCD 2. Chronic systolic HF 3. Tobacco abuse - cessation advised  [name] presents for EP follow-up. His ICD battery is at ERI. Discussed the need for ICD generator change. Risks, benefits and alternatives to ICD generator change were discussed in detail with him today. These risks include, but are not limited to, bleeding and infection. [name] expressed verbal understanding and wishes to proceed. This will be scheduled with Dr. Taylor at the next available time.   Signed, Deijah Spikes, PA-C 11/11/2012, 9:12 AM   

## 2012-11-11 NOTE — Patient Instructions (Addendum)
Your physician has recommended that you have a defibrillator inserted November 7TH (PROCEDURE STARTS AT 7:30 AM, ARRIVE AT 5:30 A.M). An implantable cardioverter defibrillator (ICD) is a small device that is placed in your chest or, in rare cases, your abdomen. This device uses electrical pulses or shocks to help control life-threatening, irregular heartbeats that could lead the heart to suddenly stop beating (sudden cardiac arrest). Leads are attached to the ICD that goes into your heart. This is done in the hospital and usually requires an overnight stay. Please see the instruction sheet given to you today for more information.  Your physician recommends that you return for lab work in: November 3, [facility])  Your physician has recommended you make the following change in your medication:   HOLD YOUR LASIX AND HUMULIN MORNING OF PROCEDURE (YOU MAKE TAKE AFTER PROCEDURE)  Your physician recommends that you continue on your current medications as directed. Please refer to the Current Medication list given to you today.

## 2012-11-13 ENCOUNTER — Encounter: Payer: Self-pay | Admitting: Internal Medicine

## 2012-11-14 ENCOUNTER — Encounter (HOSPITAL_COMMUNITY): Payer: Self-pay | Admitting: Pharmacist

## 2012-11-15 HISTORY — PX: CARDIAC DEFIBRILLATOR PLACEMENT: SHX171

## 2012-11-17 ENCOUNTER — Other Ambulatory Visit (INDEPENDENT_AMBULATORY_CARE_PROVIDER_SITE_OTHER): Payer: 59

## 2012-11-17 DIAGNOSIS — Z95810 Presence of automatic (implantable) cardiac defibrillator: Secondary | ICD-10-CM

## 2012-11-17 DIAGNOSIS — I428 Other cardiomyopathies: Secondary | ICD-10-CM

## 2012-11-17 DIAGNOSIS — I5022 Chronic systolic (congestive) heart failure: Secondary | ICD-10-CM

## 2012-11-17 LAB — CBC WITH DIFFERENTIAL/PLATELET
Basophils Absolute: 0 10*3/uL (ref 0.0–0.1)
Eosinophils Absolute: 0.2 10*3/uL (ref 0.0–0.7)
Hemoglobin: 16.9 g/dL (ref 13.0–17.0)
Lymphocytes Relative: 24.5 % (ref 12.0–46.0)
Lymphs Abs: 2 10*3/uL (ref 0.7–4.0)
Monocytes Relative: 7.8 % (ref 3.0–12.0)
Neutro Abs: 5.3 10*3/uL (ref 1.4–7.7)
Neutrophils Relative %: 65.5 % (ref 43.0–77.0)
RBC: 5.46 Mil/uL (ref 4.22–5.81)
RDW: 12.8 % (ref 11.5–14.6)
WBC: 8.1 10*3/uL (ref 4.5–10.5)

## 2012-11-17 LAB — BASIC METABOLIC PANEL
Calcium: 9 mg/dL (ref 8.4–10.5)
Creatinine, Ser: 0.8 mg/dL (ref 0.4–1.5)
GFR: 110.59 mL/min (ref >60.00)
Glucose, Bld: 187 mg/dL — ABNORMAL HIGH (ref 70–99)
Potassium: 4.4 mEq/L (ref 3.5–5.1)
Sodium: 137 mEq/L (ref 135–145)

## 2012-11-20 ENCOUNTER — Other Ambulatory Visit: Payer: Self-pay

## 2012-11-20 MED ORDER — SODIUM CHLORIDE 0.9 % IR SOLN
80.0000 mg | Status: DC
  Filled 2012-11-20 (×2): qty 2

## 2012-11-20 MED ORDER — CEFAZOLIN SODIUM-DEXTROSE 2-3 GM-% IV SOLR: 2.0000 g | INTRAVENOUS | Status: DC

## 2012-11-21 ENCOUNTER — Ambulatory Visit (HOSPITAL_COMMUNITY)
Admission: RE | Admit: 2012-11-21 | Discharge: 2012-11-21 | Disposition: A | Discharge status: Home / Self Care | Payer: 59 | Source: Ambulatory Visit | Attending: Internal Medicine | Admitting: Internal Medicine

## 2012-11-21 ENCOUNTER — Other Ambulatory Visit: Payer: Self-pay

## 2012-11-21 ENCOUNTER — Encounter (HOSPITAL_COMMUNITY)
Admission: RE | Disposition: A | Discharge status: Home / Self Care | Payer: Self-pay | Source: Ambulatory Visit | Attending: Internal Medicine

## 2012-11-21 DIAGNOSIS — E119 Type 2 diabetes mellitus without complications: Secondary | ICD-10-CM | POA: Insufficient documentation

## 2012-11-21 DIAGNOSIS — Z79899 Other long term (current) drug therapy: Secondary | ICD-10-CM | POA: Insufficient documentation

## 2012-11-21 DIAGNOSIS — I2589 Other forms of chronic ischemic heart disease: Secondary | ICD-10-CM

## 2012-11-21 DIAGNOSIS — I509 Heart failure, unspecified: Secondary | ICD-10-CM | POA: Insufficient documentation

## 2012-11-21 DIAGNOSIS — I4891 Unspecified atrial fibrillation: Secondary | ICD-10-CM | POA: Insufficient documentation

## 2012-11-21 DIAGNOSIS — I5022 Chronic systolic (congestive) heart failure: Secondary | ICD-10-CM | POA: Insufficient documentation

## 2012-11-21 DIAGNOSIS — Z4502 Encounter for adjustment and management of automatic implantable cardiac defibrillator: Secondary | ICD-10-CM | POA: Insufficient documentation

## 2012-11-21 DIAGNOSIS — Z794 Long term (current) use of insulin: Secondary | ICD-10-CM | POA: Insufficient documentation

## 2012-11-21 DIAGNOSIS — I428 Other cardiomyopathies: Secondary | ICD-10-CM | POA: Insufficient documentation

## 2012-11-21 HISTORY — PX: IMPLANTABLE CARDIOVERTER DEFIBRILLATOR (ICD) GENERATOR CHANGE: SHX5469

## 2012-11-21 LAB — SURGICAL PCR SCREEN: Staphylococcus aureus: NEGATIVE

## 2012-11-21 LAB — GLUCOSE, CAPILLARY: Glucose-Capillary: 159 mg/dL — ABNORMAL HIGH (ref 70–99)

## 2012-11-21 SURGERY — ICD GENERATOR CHANGE
Anesthesia: LOCAL

## 2012-11-21 MED ORDER — MUPIROCIN 2 % EX OINT
TOPICAL_OINTMENT | CUTANEOUS | Status: AC
  Filled 2012-11-21: qty 22

## 2012-11-21 MED ORDER — MUPIROCIN 2 % EX OINT
TOPICAL_OINTMENT | Freq: Two times a day (BID) | CUTANEOUS | Status: DC
  Administered 2012-11-21: 1 via NASAL
  Filled 2012-11-21: qty 22

## 2012-11-21 MED ORDER — MIDAZOLAM HCL 5 MG/5ML IJ SOLN
INTRAMUSCULAR | Status: AC
  Filled 2012-11-21: qty 5

## 2012-11-21 MED ORDER — LIDOCAINE HCL (PF) 1 % IJ SOLN
INTRAMUSCULAR | Status: AC
  Filled 2012-11-21: qty 60

## 2012-11-21 MED ORDER — SODIUM CHLORIDE 0.9 % IV SOLN
INTRAVENOUS | Status: DC
  Administered 2012-11-21 (×2): via INTRAVENOUS

## 2012-11-21 MED ORDER — FENTANYL CITRATE 0.05 MG/ML IJ SOLN
INTRAMUSCULAR | Status: AC
  Filled 2012-11-21: qty 2

## 2012-11-21 MED ORDER — METOPROLOL TARTRATE 1 MG/ML IV SOLN
INTRAVENOUS | Status: AC
  Filled 2012-11-21: qty 5

## 2012-11-21 MED ORDER — CHLORHEXIDINE GLUCONATE 4 % EX LIQD
60.0000 mL | Freq: Once | CUTANEOUS | Status: DC
  Filled 2012-11-21: qty 60

## 2012-11-21 NOTE — Interval H&P Note (Signed)
History and Physical Interval Note: Patient seen and examined. Agree with above. He has reached ERI. I have discussed the plan for ICD generator removal and insertion of a new device and he wishes to proceed.  11/21/2012 7:08 AM  [name]  has presented today for surgery, with the diagnosis of chf  The various methods of treatment have been discussed with the patient and family. After consideration of risks, benefits and other options for treatment, the patient has consented to  Procedure(s): ICD GENERATOR CHANGE (N/A) as a surgical intervention .  The patient's history has been reviewed, patient examined, no change in status, stable for surgery.  I have reviewed the patient's chart and labs.  Questions were answered to the patient's satisfaction.     Justin Ray.D.

## 2012-11-21 NOTE — H&P (View-Only) (Signed)
ELECTROPHYSIOLOGY OFFICE NOTE  Patient ID: Justin Ray MRN: [id number], DOB/AGE: 01-17-1965   Date of Visit: 11/11/2012  Primary Physician: Tonye Pearson, MD Primary Cardiologist: Lewayne Bunting, MD Reason for Visit: EP/device follow-up  History of Present Illness  Justin Ray is a 47 y.o. male with a NICM s/p ICD implant for primary prevention of SCD who presents today for routine electrophysiology followup. His ICD battery is at [facility].  Since last being seen in our clinic, he reports he is doing well and has no complaints. He denies chest pain or shortness of breath. He denies palpitations, dizziness, near syncope or syncope. He denies LE swelling, orthopnea, PND or recent weight gain. He is compliant and tolerating medications without difficulty.  Past Medical History Past Medical History  Diagnosis Date  . CHF (congestive heart failure)   . Cardiomyopathy   . Dyslipidemia   . Diabetes mellitus   . Anxiety   . Hypertension   . Cataract     Past Surgical History Past Surgical History  Procedure Laterality Date  . Cardiac defibrillator placement  02/01/2004  . Tonsillectomy    . Eye surgery      Allergies/Intolerances Allergies  Allergen Reactions  . Codeine Other (See Comments)    Severe HA's.  . Niaspan [Niacin Er] Other (See Comments)    Per pt feels like skin is burning( REDNESS), and pins/needles are sticking   . Sulfonamide Derivatives Other (See Comments)    thrush    Current Home Medications Current Outpatient Prescriptions  Medication Sig Dispense Refill  . ACCU-CHEK SOFTCLIX LANCETS lancets Check glucose twice daily as instructed. Dx. Code 250.00  100 each  2  . ALPRAZolam (XANAX) 1 MG tablet Take 1 tablet (1 mg total) by mouth 2 (two) times daily as needed.  60 tablet  5  . Ascorbic Acid (VITAMIN C) 1000 MG tablet Take 1,000 mg by mouth daily.      Marland Kitchen aspirin 81 MG tablet Take 81 mg by mouth daily.       . B-D INS SYR ULTRAFINE 1CC/31G 31G X  5/16" 1 ML MISC       . BD INSULIN SYRINGE ULTRAFINE 31G X 15/64" 1 ML MISC USE AS DIRECTED  100 each  10  . carvedilol (COREG) 25 MG tablet Take 25 mg by mouth 2 (two) times daily with a meal.       . clonazePAM (KLONOPIN) 1 MG tablet Take 2 tablets (2 mg total) by mouth as needed.  30 tablet  5  . cyclobenzaprine (FLEXERIL) 10 MG tablet Take 1 tablet (10 mg total) by mouth at bedtime.  30 tablet  0  . digoxin (LANOXIN) 0.125 MG tablet Take 1 tablet (125 mcg total) by mouth daily.  90 tablet  3  . furosemide (LASIX) 40 MG tablet Take 40 mg by mouth as needed.      Marland Kitchen glucose blood (ACCU-CHEK AVIVA PLUS) test strip Check glucose twice daily as instructed. Dx. Code 250.00  100 each  2  . insulin NPH-insulin regular (HUMULIN 70/30) (70-30) 100 UNIT/ML injection Inject 65 units each morning and 45 units each evening. Dispense 30 day supply or more if allowed. This prescription should cover one year.  30 mL  11  . lisinopril (PRINIVIL,ZESTRIL) 20 MG tablet Take 20 mg by mouth 2 (two) times daily.      . Needles & Syringes MISC 65 units am, 45 units pm  100 each  11  . omega-3 acid ethyl esters (  LOVAZA) 1 G capsule Take 2 g by mouth 2 (two) times daily.      Marland Kitchen PRISTIQ 50 MG 24 hr tablet take 1 tablet by mouth once daily  30 tablet  5  . Pyridoxine HCl (VITAMIN B-6) 250 MG tablet Take 250 mg by mouth daily.      . rosuvastatin (CRESTOR) 10 MG tablet Take 10 mg by mouth daily.       No current facility-administered medications for this visit.    Social History Social History  . 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 Smoking status: Current Every Day Smoker -- 1.50 packs/day    Types: Cigarettes  . Smokeless tobacco: No  . Alcohol Use: No  . Drug Use: No   Review of Systems General: No chills, fever, night sweats or weight changes Cardiovascular: No chest pain, dyspnea on exertion, edema, orthopnea, palpitations,  paroxysmal nocturnal dyspnea Dermatological: No rash, lesions or masses Respiratory: No cough, dyspnea Urologic: No hematuria, dysuria Abdominal: No nausea, vomiting, diarrhea, bright red blood per rectum, melena, or hematemesis Neurologic: No visual changes, weakness, changes in mental status All other systems reviewed and are otherwise negative except as noted above.  Physical Exam Vitals: Blood pressure 128/88, pulse 75, height 5\' 8"  (1.727 m), weight 195 lb 12.8 oz (88.814 kg), SpO2 98.00%.  General: Well developed, well appearing 47 y.o. male in no acute distress. HEENT: Normocephalic, atraumatic. EOMs intact. Sclera nonicteric. Oropharynx clear.  Neck: Supple without bruits. No JVD. Lungs: Respirations regular and unlabored, CTA bilaterally. No wheezes, rales or rhonchi. Heart: RRR. S1, S2 present. No murmurs, rub, S3 or S4. Abdomen: Soft, non-distended.  Extremities: No clubbing, cyanosis or edema. PT/Radials 2+ and equal bilaterally. Psych: Normal affect. Neuro: Alert and oriented X 3. Moves all extremities spontaneously. Skin: ICD implant site is well healed.   Diagnostics Device interrogation - recent check - battery at ERI since 10/24/2012, V pacing 0%; histograms appropriate  Assessment and Plan 1. NICM s/p ICD implant for primary prevention SCD 2. Chronic systolic HF 3. Tobacco abuse - cessation advised  Justin Ray presents for EP follow-up. His ICD battery is at [facility]. Discussed the need for ICD generator change. Risks, benefits and alternatives to ICD generator change were discussed in detail with him today. These risks include, but are not limited to, bleeding and infection. Justin Ray expressed verbal understanding and wishes to proceed. This will be scheduled with Dr. Ladona Ridgel at the next available time.   Signed, Rick Duff, PA-C 11/11/2012, 9:12 AM

## 2012-11-21 NOTE — H&P (Signed)
  ICD Criteria  Current LVEF (within 6 months):20%  NYHA Functional Classification: Class II  Heart Failure History:  Yes, Duration of heart failure since onset is > 9 months  Non-Ischemic Dilated Cardiomyopathy History:  Yes, timeframe is > 9 months  Atrial Fibrillation/Atrial Flutter:  No.  Ventricular Tachycardia History:  No.  Cardiac Arrest History:  No  History of Syndromes with Risk of Sudden Death:  No.  Previous ICD:  Yes, ICD Type:  Single, Reason for ICD:  Primary prevention.  20%  Electrophysiology Study: No.  Anticoagulation Therapy:  Patient is NOT on anticoagulation therapy.   Beta Blocker Therapy:  Yes.   Ace Inhibitor/ARB Therapy:  Yes.

## 2012-11-21 NOTE — CV Procedure (Signed)
EP Procedure Note  Procedure: Removal of a previous implanted [employer] ICD which had reached elective replacement, and insertion of a new [employer] ICD with ICD pocket revision and defibrillation threshold testing.  Indication: Long-standing nonischemic cardiomyopathy, chronic class II systolic heart failure despite maximal medical therapy, ejection fraction 20%  Findings: After informed consent was obtained, the patient was taken to the diagnostic electrophysiology laboratory in the fasting state. After the usual preparation and draping, intravenous fentanyl and Versed were given for sedation. 30 cc of lidocaine was infiltrated into the left pectoral region. A 7 cm incision was carried out over this region and  electrocautery was utilized to dissect down to the ICD pocket. The ICD was removed with gentle traction. Scar tissue was freed up from the ICD lead. Because the patient complained of some discomfort in his shoulder area, the pocket was revised debriding the medial area and expanding the pocket medially. The pocket was irrigated with antibiotic irrigation and electrocautery was utilized to ensure hemostasis. The old [employer] device was disconnected from the lead. The model 0185 [employer]-Tex lead had R waves of 10, a pacing impedance of 473 ohms, threshold of 1.3 volts 0.4 ms. The new [employer] single chamber defibrillator model [id number], serial number [id number], was connected to the old leads, and placed back the saphenous pocket where it was secured more medially with silk suture. The pocket was again irrigated with antibiotic irrigation, and the incision was closed with 2-0 and 3-0 Vicryl suture. At this point I scrubbed out of the case and supervised defibrillation threshold testing.  After the patient was more deeply sedated with intravenous fentanyl and Versed under my direct supervision, ventricular fibrillation was induced with a T-wave shock. A 23 J shock was  subsequently delivered terminating ventricular fibrillation. Unfortunately, the patient was shocked into atrial fibrillation for which she was treated with metoprolol. He was returned to his room in satisfactory condition.  Complications: The procedure was uncomplicated except for the development of atrial fibrillation at the time of device testing.  Conclusion: This demonstrates successful removal of a previous implanted [employer] ICD, and insertion of a new [employer] ICD with ICD pocket revision and defibrillation rectal testing.  Lewayne Bunting, M.D.

## 2012-11-26 ENCOUNTER — Ambulatory Visit (INDEPENDENT_AMBULATORY_CARE_PROVIDER_SITE_OTHER): Payer: 59 | Admitting: Internal Medicine

## 2012-11-26 VITALS — BP 120/76 | HR 68 | Temp 98.4°F | Resp 16 | Ht 68.0 in | Wt 194.8 lb

## 2012-11-26 DIAGNOSIS — IMO0001 Reserved for inherently not codable concepts without codable children: Secondary | ICD-10-CM

## 2012-11-26 DIAGNOSIS — E1059 Type 1 diabetes mellitus with other circulatory complications: Secondary | ICD-10-CM

## 2012-11-26 DIAGNOSIS — F17200 Nicotine dependence, unspecified, uncomplicated: Secondary | ICD-10-CM

## 2012-11-26 DIAGNOSIS — Z23 Encounter for immunization: Secondary | ICD-10-CM

## 2012-11-26 DIAGNOSIS — F32A Depression, unspecified: Secondary | ICD-10-CM

## 2012-11-26 DIAGNOSIS — F3289 Other specified depressive episodes: Secondary | ICD-10-CM

## 2012-11-26 DIAGNOSIS — F329 Major depressive disorder, single episode, unspecified: Secondary | ICD-10-CM

## 2012-11-26 LAB — POCT GLYCOSYLATED HEMOGLOBIN (HGB A1C): Hemoglobin A1C: 8

## 2012-11-26 NOTE — Progress Notes (Signed)
  Subjective:    Patient ID: Justin Ray, male    DOB: Oct 23, 1965, 47 y.o.   MRN: [id number]  HPI Patient presents today with 3 month follow up. Had pacemaker battery changed last week by Dr. Ladona Ridgel, he has had no problems following the procedure. Had carpel tunnel release about 3 weeks ago and is still having PT. Has had less pain than he expected. He needs to have carpel tunnel release surgery on the left, but is waiting for this.  Blood sugars at home 100-180 fasting. Walks some for exercise. Has noticed increase appetite while out of work recovering from surgery.  Still taking Pristiq- has been feeling depressed because of his surgery and procedure and is getting bored because he is out of work until next month or Jan. Thinks this ls likely to get better after he returns to work.  Has occasional chest pain, no recent change. ICD has not gone off. No SOB.   Increased tobacco use with being out of work. Not currently interested in quitting. Has tried Chantix in past without reduction in cravings. Used a nicotine patch in the past which did help. Feels like he is not currently motivated even though he is aware of benefits of quitting. Has tried to quit about 5 times and always restarts due to external stressors.   Flu vaccine today.  Review of Systems     Objective:   Physical Exam  Nursing note and vitals reviewed. Constitutional: He appears well-developed and well-nourished.  HENT:  Right Ear: External ear normal.  Left Ear: External ear normal.  Nose: Nose normal.  Cardiovascular: Normal rate and regular rhythm.   Pulmonary/Chest: Effort normal and breath sounds normal.  Skin: Skin is warm and dry.  Left upper chest with intact steri strips. No drainage, no redness.  Psychiatric: He has a normal mood and affect. His behavior is normal. Judgment and thought content normal.   Results for orders placed in visit on 11/26/12  POCT GLYCOSYLATED HEMOGLOBIN (HGB A1C)      Result  Value Range   Hemoglobin A1C 8.0         Assessment & Plan:  Need for prophylactic vaccination and inoculation against influenza - Plan: Flu Vaccine QUAD 36+ mos IM 1- DM II- improved HgA1C from 8.7 to 8.0. Continue current insulin dose. Encouraged increased walking while healing from carpel tunnel surgery.  2- Nicotine use disorder- discussed smoking cessation and offered support when patient is ready.  3-Depression- continue Pristiq, try to stay busy until able to resume work.  4- Follow up in 3 months.   I have completed the patient encounter in its entirety as documented by FNP Leone Payor, with editing by me where necessary. Robert P. Merla Riches, M.D.

## 2012-12-03 ENCOUNTER — Other Ambulatory Visit: Payer: Self-pay | Admitting: Orthopedic Surgery

## 2012-12-12 ENCOUNTER — Ambulatory Visit (INDEPENDENT_AMBULATORY_CARE_PROVIDER_SITE_OTHER): Payer: 59 | Admitting: *Deleted

## 2012-12-12 DIAGNOSIS — I428 Other cardiomyopathies: Secondary | ICD-10-CM

## 2012-12-12 LAB — MDC_IDC_ENUM_SESS_TYPE_REMOTE
Brady Statistic RV Percent Paced: 0 %
Lead Channel Setting Pacing Amplitude: 3 V
Lead Channel Setting Sensing Sensitivity: 0.6 mV
Zone Setting Detection Interval: 250 ms
Zone Setting Detection Interval: 300 ms
Zone Setting Detection Interval: 333.3 ms

## 2012-12-17 ENCOUNTER — Encounter (HOSPITAL_BASED_OUTPATIENT_CLINIC_OR_DEPARTMENT_OTHER): Payer: Self-pay | Admitting: *Deleted

## 2012-12-17 NOTE — Progress Notes (Signed)
Pt still works some-has had chf with an IDC 8 yr-just repaced-ef 20% ,cleared for surgery by dr Michelle Piper for mac-pt does smoke and has sleep apnea-does not use a cpap-he had carpal tunnel 10/14 GSC-did fine Labs done 11/17/12-32 days-

## 2012-12-19 ENCOUNTER — Encounter (HOSPITAL_BASED_OUTPATIENT_CLINIC_OR_DEPARTMENT_OTHER): Payer: Self-pay | Admitting: Certified Registered"

## 2012-12-19 ENCOUNTER — Ambulatory Visit (HOSPITAL_BASED_OUTPATIENT_CLINIC_OR_DEPARTMENT_OTHER): Payer: 59 | Admitting: Certified Registered"

## 2012-12-19 ENCOUNTER — Encounter (HOSPITAL_BASED_OUTPATIENT_CLINIC_OR_DEPARTMENT_OTHER)
Admission: RE | Disposition: A | Discharge status: Home / Self Care | Payer: Self-pay | Source: Ambulatory Visit | Attending: Orthopedic Surgery

## 2012-12-19 ENCOUNTER — Encounter (HOSPITAL_BASED_OUTPATIENT_CLINIC_OR_DEPARTMENT_OTHER): Payer: 59 | Admitting: Certified Registered"

## 2012-12-19 ENCOUNTER — Ambulatory Visit (HOSPITAL_BASED_OUTPATIENT_CLINIC_OR_DEPARTMENT_OTHER)
Admission: RE | Admit: 2012-12-19 | Discharge: 2012-12-19 | Disposition: A | Discharge status: Home / Self Care | Payer: 59 | Source: Ambulatory Visit | Attending: Orthopedic Surgery | Admitting: Orthopedic Surgery

## 2012-12-19 DIAGNOSIS — F411 Generalized anxiety disorder: Secondary | ICD-10-CM | POA: Insufficient documentation

## 2012-12-19 DIAGNOSIS — Z95810 Presence of automatic (implantable) cardiac defibrillator: Secondary | ICD-10-CM | POA: Insufficient documentation

## 2012-12-19 DIAGNOSIS — G5600 Carpal tunnel syndrome, unspecified upper limb: Secondary | ICD-10-CM | POA: Insufficient documentation

## 2012-12-19 DIAGNOSIS — I509 Heart failure, unspecified: Secondary | ICD-10-CM | POA: Insufficient documentation

## 2012-12-19 DIAGNOSIS — E119 Type 2 diabetes mellitus without complications: Secondary | ICD-10-CM | POA: Insufficient documentation

## 2012-12-19 DIAGNOSIS — E669 Obesity, unspecified: Secondary | ICD-10-CM | POA: Insufficient documentation

## 2012-12-19 DIAGNOSIS — F17200 Nicotine dependence, unspecified, uncomplicated: Secondary | ICD-10-CM | POA: Insufficient documentation

## 2012-12-19 DIAGNOSIS — I428 Other cardiomyopathies: Secondary | ICD-10-CM | POA: Insufficient documentation

## 2012-12-19 DIAGNOSIS — E785 Hyperlipidemia, unspecified: Secondary | ICD-10-CM | POA: Insufficient documentation

## 2012-12-19 DIAGNOSIS — I10 Essential (primary) hypertension: Secondary | ICD-10-CM | POA: Insufficient documentation

## 2012-12-19 HISTORY — DX: Presence of automatic (implantable) cardiac defibrillator: Z95.810

## 2012-12-19 HISTORY — DX: Sleep apnea, unspecified: G47.30

## 2012-12-19 HISTORY — DX: Presence of spectacles and contact lenses: Z97.3

## 2012-12-19 HISTORY — PX: CARPAL TUNNEL RELEASE: SHX101

## 2012-12-19 HISTORY — DX: Carpal tunnel syndrome, unspecified upper limb: G56.00

## 2012-12-19 LAB — GLUCOSE, CAPILLARY
Glucose-Capillary: 209 mg/dL — ABNORMAL HIGH (ref 70–99)
Glucose-Capillary: 178 mg/dL — ABNORMAL HIGH (ref 70–99)

## 2012-12-19 SURGERY — CARPAL TUNNEL RELEASE
Anesthesia: Monitor Anesthesia Care | Site: Hand | Laterality: Left

## 2012-12-19 MED ORDER — CHLORHEXIDINE GLUCONATE 4 % EX LIQD: 60.0000 mL | Freq: Once | CUTANEOUS | Status: DC

## 2012-12-19 MED ORDER — MIDAZOLAM HCL 5 MG/5ML IJ SOLN
INTRAMUSCULAR | Status: DC | PRN
  Administered 2012-12-19: 2 mg via INTRAVENOUS

## 2012-12-19 MED ORDER — SODIUM CHLORIDE 0.45 % IV SOLN: INTRAVENOUS | Status: DC

## 2012-12-19 MED ORDER — LIDOCAINE HCL (PF) 1 % IJ SOLN
INTRAMUSCULAR | Status: DC | PRN
  Administered 2012-12-19: 10 mL

## 2012-12-19 MED ORDER — OXYCODONE HCL 5 MG/5ML PO SOLN: 5.0000 mg | Freq: Once | ORAL | Status: DC | PRN

## 2012-12-19 MED ORDER — OXYCODONE-ACETAMINOPHEN 10-325 MG PO TABS: 1.0000 | ORAL_TABLET | ORAL | Status: DC | PRN

## 2012-12-19 MED ORDER — CEFAZOLIN SODIUM-DEXTROSE 2-3 GM-% IV SOLR
2.0000 g | INTRAVENOUS | Status: AC
  Administered 2012-12-19: 2 g via INTRAVENOUS

## 2012-12-19 MED ORDER — BUPIVACAINE HCL (PF) 0.5 % IJ SOLN
INTRAMUSCULAR | Status: DC | PRN
  Administered 2012-12-19: 10 mL

## 2012-12-19 MED ORDER — INSULIN ASPART 100 UNIT/ML ~~LOC~~ SOLN
SUBCUTANEOUS | Status: AC
  Filled 2012-12-19: qty 1

## 2012-12-19 MED ORDER — FENTANYL CITRATE 0.05 MG/ML IJ SOLN: 25.0000 ug | INTRAMUSCULAR | Status: DC | PRN

## 2012-12-19 MED ORDER — CEFAZOLIN SODIUM 1-5 GM-% IV SOLN
INTRAVENOUS | Status: AC
  Filled 2012-12-19: qty 100

## 2012-12-19 MED ORDER — OXYCODONE HCL 5 MG PO TABS: 5.0000 mg | ORAL_TABLET | Freq: Once | ORAL | Status: DC | PRN

## 2012-12-19 MED ORDER — FENTANYL CITRATE 0.05 MG/ML IJ SOLN
INTRAMUSCULAR | Status: AC
  Filled 2012-12-19: qty 2

## 2012-12-19 MED ORDER — LACTATED RINGERS IV SOLN
INTRAVENOUS | Status: DC
  Administered 2012-12-19: 12:00:00 via INTRAVENOUS

## 2012-12-19 MED ORDER — INSULIN ASPART 100 UNIT/ML ~~LOC~~ SOLN
5.0000 [IU] | Freq: Once | SUBCUTANEOUS | Status: AC
  Administered 2012-12-19: 5 [IU] via SUBCUTANEOUS

## 2012-12-19 MED ORDER — MIDAZOLAM HCL 2 MG/2ML IJ SOLN
INTRAMUSCULAR | Status: AC
  Filled 2012-12-19: qty 2

## 2012-12-19 MED ORDER — ONDANSETRON HCL 4 MG/2ML IJ SOLN: 4.0000 mg | Freq: Once | INTRAMUSCULAR | Status: DC | PRN

## 2012-12-19 MED ORDER — FENTANYL CITRATE 0.05 MG/ML IJ SOLN
INTRAMUSCULAR | Status: DC | PRN
  Administered 2012-12-19: 100 ug via INTRAVENOUS

## 2012-12-19 SURGICAL SUPPLY — 47 items
BANDAGE ELASTIC 3 VELCRO ST LF (GAUZE/BANDAGES/DRESSINGS) ×2 IMPLANT
BLADE CARPAL TUNNEL SNGL USE (BLADE) ×2 IMPLANT
BLADE SURG 15 STRL LF DISP TIS (BLADE) ×2 IMPLANT
BLADE SURG 15 STRL SS (BLADE) ×4
BNDG CONFORM 3 STRL LF (GAUZE/BANDAGES/DRESSINGS) ×2 IMPLANT
BNDG PLASTER X FAST 3X3 WHT LF (CAST SUPPLIES) ×2 IMPLANT
BNDG PLSTR 9X3 FST ST WHT (CAST SUPPLIES) ×1
BRUSH SCRUB EZ PLAIN DRY (MISCELLANEOUS) ×2 IMPLANT
CORDS BIPOLAR (ELECTRODE) ×2 IMPLANT
COVER MAYO STAND STRL (DRAPES) ×2 IMPLANT
COVER TABLE BACK 60X90 (DRAPES) ×2 IMPLANT
CUFF TOURNIQUET SINGLE 18IN (TOURNIQUET CUFF) ×1 IMPLANT
DRAIN PENROSE 1/4X12 LTX STRL (WOUND CARE) IMPLANT
DRAPE EXTREMITY T 121X128X90 (DRAPE) ×2 IMPLANT
DRAPE SURG 17X23 STRL (DRAPES) ×2 IMPLANT
DRSG EMULSION OIL 3X3 NADH (GAUZE/BANDAGES/DRESSINGS) ×2 IMPLANT
GAUZE SPONGE 4X4 16PLY XRAY LF (GAUZE/BANDAGES/DRESSINGS) IMPLANT
GLOVE BIO SURGEON STRL SZ 6.5 (GLOVE) ×1 IMPLANT
GLOVE BIOGEL M STRL SZ7.5 (GLOVE) ×1 IMPLANT
GLOVE BIOGEL PI IND STRL 7.0 (GLOVE) IMPLANT
GLOVE BIOGEL PI INDICATOR 7.0 (GLOVE) ×1
GLOVE SS BIOGEL STRL SZ 8 (GLOVE) ×1 IMPLANT
GLOVE SUPERSENSE BIOGEL SZ 8 (GLOVE) ×1
GOWN PREVENTION PLUS XLARGE (GOWN DISPOSABLE) ×2 IMPLANT
GOWN PREVENTION PLUS XXLARGE (GOWN DISPOSABLE) ×2 IMPLANT
LOOP VESSEL MAXI BLUE (MISCELLANEOUS) IMPLANT
NDL HYPO 25X1 1.5 SAFETY (NEEDLE) ×1 IMPLANT
NDL SAFETY ECLIPSE 18X1.5 (NEEDLE) ×1 IMPLANT
NEEDLE HYPO 18GX1.5 SHARP (NEEDLE) ×2
NEEDLE HYPO 22GX1.5 SAFETY (NEEDLE) ×1 IMPLANT
NEEDLE HYPO 25X1 1.5 SAFETY (NEEDLE) ×4 IMPLANT
NS IRRIG 1000ML POUR BTL (IV SOLUTION) ×2 IMPLANT
PACK BASIN DAY SURGERY FS (CUSTOM PROCEDURE TRAY) ×2 IMPLANT
PAD ALCOHOL SWAB (MISCELLANEOUS) ×16 IMPLANT
PAD CAST 3X4 CTTN HI CHSV (CAST SUPPLIES) ×2 IMPLANT
PADDING CAST ABS 4INX4YD NS (CAST SUPPLIES) ×1
PADDING CAST ABS COTTON 4X4 ST (CAST SUPPLIES) ×1 IMPLANT
PADDING CAST COTTON 3X4 STRL (CAST SUPPLIES) ×4
SPONGE GAUZE 4X4 12PLY (GAUZE/BANDAGES/DRESSINGS) IMPLANT
STOCKINETTE 4X48 STRL (DRAPES) ×2 IMPLANT
SUT PROLENE 4 0 PS 2 18 (SUTURE) ×2 IMPLANT
SYR BULB 3OZ (MISCELLANEOUS) ×2 IMPLANT
SYR CONTROL 10ML LL (SYRINGE) ×4 IMPLANT
TOWEL OR 17X24 6PK STRL BLUE (TOWEL DISPOSABLE) ×2 IMPLANT
TOWEL OR NON WOVEN STRL DISP B (DISPOSABLE) ×2 IMPLANT
TRAY DSU PREP LF (CUSTOM PROCEDURE TRAY) ×2 IMPLANT
UNDERPAD 30X30 INCONTINENT (UNDERPADS AND DIAPERS) ×2 IMPLANT

## 2012-12-19 NOTE — Anesthesia Postprocedure Evaluation (Signed)
  Anesthesia Post-op Note  Patient: Justin Ray  Procedure(s) Performed: Procedure(s): LEFT CARPAL TUNNEL RELEASE (Left)  Patient Location: PACU  Anesthesia Type:General  Level of Consciousness: awake, alert  and oriented  Airway and Oxygen Therapy: Patient Spontanous Breathing  Post-op Pain: none  Post-op Assessment: Post-op Vital signs reviewed, Patient's Cardiovascular Status Stable, Respiratory Function Stable, Patent Airway and Pain level controlled  Post-op Vital Signs: stable  Complications: No apparent anesthesia complications

## 2012-12-19 NOTE — Anesthesia Preprocedure Evaluation (Signed)
Anesthesia Evaluation  Patient identified by MRN, date of birth, ID band Patient awake    Reviewed: Allergy & Precautions, H&P , NPO status , Patient's Chart, lab work & pertinent test results, reviewed documented beta blocker date and time   Airway Mallampati: II TM Distance: >3 FB Neck ROM: Full    Dental  (+) Edentulous Upper, Partial Lower and Dental Advisory Given   Pulmonary Current Smoker,    + decreased breath sounds      Cardiovascular hypertension, Rhythm:Regular Rate:Normal     Neuro/Psych    GI/Hepatic   Endo/Other    Renal/GU      Musculoskeletal   Abdominal (+) + obese,   Peds  Hematology   Anesthesia Other Findings   Reproductive/Obstetrics                           Anesthesia Physical Anesthesia Plan  ASA: III  Anesthesia Plan: MAC   Post-op Pain Management:    Induction: Intravenous  Airway Management Planned: Natural Airway and Simple Face Mask  Additional Equipment:   Intra-op Plan:   Post-operative Plan:   Informed Consent: I have reviewed the patients History and Physical, chart, labs and discussed the procedure including the risks, benefits and alternatives for the proposed anesthesia with the patient or authorized representative who has indicated his/her understanding and acceptance.   Dental advisory given  Plan Discussed with: CRNA and Anesthesiologist  Anesthesia Plan Comments: (Non-ischemic cardiomyopathy EF 20% AICD in place generator replaced 11/21/12 Type 2 DM glucose 209 Htn Smoker/COPD  Plan MAC, will place magnet over AICD  Discussed AICD with [employer], [place]. When magnet is over AICD, slight beep will be heard with each QRS. When magnet is removed, beeping will stop and normal AICD function will resume. With magnet on, anti-tachycardia therapy is turned off.  Kipp Brood, MD)        Anesthesia Quick Evaluation

## 2012-12-19 NOTE — Anesthesia Procedure Notes (Signed)
Procedure Name: MAC Date/Time: 12/19/2012 12:16 PM Performed by: Sylus Stgermain Pre-anesthesia Checklist: Patient identified, Emergency Drugs available, Suction available, Patient being monitored and Timeout performed Patient Re-evaluated:Patient Re-evaluated prior to inductionOxygen Delivery Method: Simple face mask

## 2012-12-19 NOTE — Op Note (Signed)
See Dictation #[id number] Amanda Pea MD

## 2012-12-19 NOTE — Transfer of Care (Signed)
Immediate Anesthesia Transfer of Care Note  Patient: Justin Ray  Procedure(s) Performed: Procedure(s): LEFT CARPAL TUNNEL RELEASE (Left)  Patient Location: PACU  Anesthesia Type:MAC  Level of Consciousness: awake, alert , oriented and patient cooperative  Airway & Oxygen Therapy: Patient Spontanous Breathing and Patient connected to face mask oxygen  Post-op Assessment: Report given to PACU RN and Post -op Vital signs reviewed and stable  Post vital signs: Reviewed and stable  Complications: No apparent anesthesia complications

## 2012-12-19 NOTE — H&P (Signed)
Justin Ray is an 47 y.o. male.   Chief Complaint: Patient presents for left carpal tunnel release HPI: Patient presents for left carpal tunnel release. He notes no other complaints germane to this surgical endeavor.  Marland Kitchen.Patient presents for evaluation and treatment of the of their upper extremity predicament. The patient denies neck back chest or of abdominal pain. The patient notes that they have no lower extremity problems. The patient from primarily complains of the upper extremity pain noted.   Past Medical History  Diagnosis Date  . CHF (congestive heart failure)   . Cardiomyopathy   . Dyslipidemia   . Diabetes mellitus   . Anxiety   . Hypertension   . Cataract   . Wears glasses   . Sleep apnea     was tested many yr ago-does not use a cpap-  . ICD (implantable cardioverter-defibrillator) in place   . Carpal tunnel syndrome 10/14    right-GSC    Past Surgical History  Procedure Laterality Date  . Cardiac defibrillator placement  02/01/2004  . Tonsillectomy    . Eye surgery    . Cardiac defibrillator placement  11/14    replaced with new one    Family History  Problem Relation Age of Onset  . Cancer    . Coronary artery disease Father     CABG  . Cancer Mother     colon  . Heart disease Brother    Social History:  reports that he has been smoking Cigarettes.  He has been smoking about 1.50 packs per day. He does not have any smokeless tobacco history on file. He reports that he does not drink alcohol or use illicit drugs.  Allergies:  Allergies  Allergen Reactions  . Codeine Other (See Comments)    Severe HA's.  . Niaspan [Niacin Er] Other (See Comments)    Per pt feels like skin is burning( REDNESS), and pins/needles are sticking   . Sulfonamide Derivatives Other (See Comments)    thrush    Medications Prior to Admission  Medication Sig Dispense Refill  . ALPRAZolam (XANAX) 1 MG tablet Take 1 tablet (1 mg total) by mouth 2 (two) times daily as needed.   60 tablet  5  . Ascorbic Acid (VITAMIN C) 1000 MG tablet Take 1,000 mg by mouth daily.      Marland Kitchen aspirin 81 MG tablet Take 81 mg by mouth daily.       . clonazePAM (KLONOPIN) 1 MG tablet Take 2 tablets (2 mg total) by mouth as needed.  30 tablet  5  . digoxin (LANOXIN) 0.125 MG tablet Take 1 tablet (125 mcg total) by mouth daily.  90 tablet  3  . insulin NPH-insulin regular (HUMULIN 70/30) (70-30) 100 UNIT/ML injection Inject 65 units each morning and 45 units each evening. Dispense 30 day supply or more if allowed. This prescription should cover one year.  30 mL  11  . lisinopril (PRINIVIL,ZESTRIL) 20 MG tablet Take 20 mg by mouth 2 (two) times daily.      Marland Kitchen omega-3 acid ethyl esters (LOVAZA) 1 G capsule Take 2 g by mouth 2 (two) times daily.      Marland Kitchen PRISTIQ 50 MG 24 hr tablet take 1 tablet by mouth once daily  30 tablet  5  . Pyridoxine HCl (VITAMIN B-6) 250 MG tablet Take 250 mg by mouth daily.      . rosuvastatin (CRESTOR) 10 MG tablet Take 10 mg by mouth daily.      Marland Kitchen  ACCU-CHEK SOFTCLIX LANCETS lancets Check glucose twice daily as instructed. Dx. Code 250.00  100 each  2  . B-D INS SYR ULTRAFINE 1CC/31G 31G X 5/16" 1 ML MISC       . BD INSULIN SYRINGE ULTRAFINE 31G X 15/64" 1 ML MISC USE AS DIRECTED  100 each  10  . carvedilol (COREG) 25 MG tablet Take 25 mg by mouth 2 (two) times daily with a meal.       . furosemide (LASIX) 40 MG tablet Take 40 mg by mouth as needed.      Marland Kitchen glucose blood (ACCU-CHEK AVIVA PLUS) test strip Check glucose twice daily as instructed. Dx. Code 250.00  100 each  2  . Needles & Syringes MISC 65 units am, 45 units pm  100 each  11    Results for orders placed during the hospital encounter of 12/19/12 (from the past 48 hour(s))  GLUCOSE, CAPILLARY     Status: Abnormal   Collection Time    12/19/12 10:47 AM      Result Value Range   Glucose-Capillary 209 (*) 70 - 99 mg/dL  POCT HEMOGLOBIN-HEMACUE     Status: None   Collection Time    12/19/12 10:49 AM      Result  Value Range   Hemoglobin 16.3  13.0 - 17.0 g/dL   No results found.  Review of Systems  HENT: Negative.   Eyes: Negative.   Respiratory: Negative.   Cardiovascular: Negative.   Gastrointestinal: Negative.   Genitourinary: Negative.   Skin: Negative.     Blood pressure 131/84, pulse 84, temperature 97.8 F (36.6 C), temperature source Oral, resp. rate 16, height 5\' 8"  (1.727 m), weight 89.54 kg (197 lb 6.4 oz), SpO2 97.00%. Physical Exam left carpal tunnel syndrome with noted positive Tinel's Phalen's and median nerve compression test. The arm is otherwise neurovascularly intact .Marland KitchenThe patient is alert and oriented in no acute distress the patient complains of pain in the affected upper extremity.  The patient is noted to have a normal HEENT exam.  Lung fields show equal chest expansion and no shortness of breath  abdomen exam is nontender without distention.  Lower extremity examination does not show any fracture dislocation or blood clot symptoms.  Pelvis is stable neck and back are stable and nontender  Assessment/Plan .Marland KitchenWe are planning surgery for your upper extremity. The risk and benefits of surgery include risk of bleeding infection anesthesia damage to normal structures and failure of the surgery to accomplish its intended goals of relieving symptoms and restoring function with this in mind we'll going to proceed. I have specifically discussed with the patient the pre-and postoperative regime and the does and don'ts and risk and benefits in great detail. Risk and benefits of surgery also include risk of dystrophy chronic nerve pain failure of the healing process to go onto completion and other inherent risks of surgery The relavent the pathophysiology of the disease/injury process, as well as the alternatives for treatment and postoperative course of action has been discussed in great detail with the patient who desires to proceed.  We will do everything in our power to help you  (the patient) restore function to the upper extremity. Is a pleasure to see this patient today.   Lucine Bilski III,Alajia Schmelzer M 12/19/2012, 11:56 AM

## 2012-12-22 ENCOUNTER — Encounter (HOSPITAL_BASED_OUTPATIENT_CLINIC_OR_DEPARTMENT_OTHER): Payer: Self-pay | Admitting: Orthopedic Surgery

## 2012-12-22 NOTE — Addendum Note (Signed)
Addendum created 12/22/12 1610 by Tonette Bihari   Modules edited: Anesthesia Responsible Staff

## 2012-12-22 NOTE — Op Note (Signed)
[name], Justin Ray NO.:  [id number]  MEDICAL RECORD NO.:  [id number]  LOCATION:                               FACILITY:  [facility]  PHYSICIAN:  Dionne Ano. Nadine Ryle, M.D.DATE OF BIRTH:  January 19, 1965  DATE OF PROCEDURE:  12/19/2012 DATE OF DISCHARGE:  12/19/2012                              OPERATIVE REPORT   PREOPERATIVE DIAGNOSIS:  Left carpal tunnel syndrome.  POSTOPERATIVE DIAGNOSIS:  Left carpal tunnel syndrome.  PROCEDURE: 1. Left median nerve/peripheral nerve block __________ carpal tunnel     release. 2. Left __________ carpal tunnel release.  SURGEON:  Dionne Ano. Amanda Pea, M.D.  ASSISTANT:  None.  COMPLICATIONS:  None.  ANESTHESIA:  Peripheral nerve block with IV sedation keeping the patient awake, alert and oriented the entire case.  TOURNIQUET TIME:  Less than 10 minutes.  INDICATIONS:  This is a 47 year old male with multiple medical problems who presents with the above-mentioned diagnosis.  I have counseled him in regard to risks and benefits of surgery and he desires to proceed with the above-mentioned operative intervention.  I have discussed with him extensively the pros and cons, risks and benefits, do's and don'ts, and extensively discussed with him tobacco cessation.  With all these issues in mind, we will proceed.  OPERATION IN DETAIL:  The patient was seen by myself and anesthesia, taken to the operating suite, underwent smooth induction of peripheral nerve/median nerve block with 20 mL of Sensorcaine and lidocaine mixture without epinephrine.  He was then prepped and draped in usual sterile fashion with Betadine scrub and paint.  Time-out was then called.  Pre and postop checklist secured and the patient underwent an insufflation of the tourniquet to 250 mmHg.  Dissection was carried down.  Distally, the transverse carpal ligament was identified after the palmar fascia was released.  Through the 1.5 cm incision, I released the distal  edge, noted fat pad egression, verified distal edge release with visual 4.0 loupe magnification and then dissected in a distal-to-proximal dissection pathway until adequate room was available for canal prepared toward device 1, 2, and 3, which were placed just under the proximal leading leaflet of the transverse carpal ligament.  Once this was complete, I then placed a security clip, __________ disengaged__________ security clip effectively releasing the proximal leaflet of the transverse carpal ligament.  Once this was done, I removed the apparatus, identified the release, which looked excellent, noted no space-occupying lesions, irrigated copiously, deflated the tourniquet, secured the hemostasis and then closed the wound with Prolene.  Sterile dressing was applied.  He was taken to the recovery room in stable condition.  I will see him back in the office in 7 days, therapy in 12 days and asked him to notify us should any problems occur.  Do's and don'ts had been discussed.  All questions have been encouraged and answered.  It was an absolute pleasure to see him and treat him today and we look forward to participate in his postop recovery.     Dionne Ano. Amanda Pea, M.D.   ______________________________ Dionne Ano. Amanda Pea, M.D.    [facility]  D:  12/19/2012  T:  12/20/2012  Job:  [id number]

## 2012-12-25 ENCOUNTER — Telehealth: Payer: Self-pay | Admitting: Internal Medicine

## 2012-12-25 NOTE — Telephone Encounter (Signed)
New message    Had defib changeout in Nov----Pt noticed that his incision looks like it has a small piece of gauge stuck in the incision site.  Today is the first day he looked closely at it.  He has not seen Dr Ladona Ridgel since he had the [facility]

## 2012-12-25 NOTE — Telephone Encounter (Signed)
New message     Had defib changeout in Nov---Pt noticed that his incision looks like it has a small piece of gauge stuck in the incision site.  Today is the first day he looked closely at it.  He has not seen Dr Ladona Ridgel since he had the change out.  Message is typed twice because when I went to office notes to see if he had seen Dr Oneita Hurt exited to far and did not send this message to anyone. Pls call pt and advise him on what to do.

## 2012-12-25 NOTE — Telephone Encounter (Signed)
Pt calls today b/c he has not had post op follow-up after ICD generator change.  Pt saw his cardiologist today ( Dr. Wille Glaser) & had him take a look at his wound site.  Denies any fever,drainage, or redness at the site States " my cardiologist looked at it and he said it looks like some gauze bandage hanging out of the incision site" States he was told to call our office & have someone look at it  I have made pt an appt on the nurse room schedule to have wound check. Will forward to [place] also. Have also scheduled follow-up appt with Dr. Ladona Ridgel for pt next week.  Mylo Red RN

## 2012-12-26 ENCOUNTER — Ambulatory Visit (INDEPENDENT_AMBULATORY_CARE_PROVIDER_SITE_OTHER): Payer: 59 | Admitting: *Deleted

## 2012-12-26 ENCOUNTER — Encounter: Payer: Self-pay | Admitting: *Deleted

## 2012-12-26 DIAGNOSIS — Z95810 Presence of automatic (implantable) cardiac defibrillator: Secondary | ICD-10-CM

## 2012-12-26 DIAGNOSIS — I428 Other cardiomyopathies: Secondary | ICD-10-CM

## 2012-12-26 NOTE — Progress Notes (Signed)
Pt seen in device clinic for follow up of replaced ICD.  No redness, swelling, or edema.  Steri-strips removed prior to arrival. Pocket had minor suture exposure--noticed by pt's primary cardiologist. Removed partial suture; ok per SK.  Device interrogated and found to be functioning normally.  No changes made today. See PaceArt for full details.  Pt denies chest pain, shortness of breath, palpitations, or dizziness.  ROV w/ Dr. Ladona Ridgel 01/01/13.  Lateka Rady 12/26/2012 1:17 PM

## 2012-12-29 LAB — MDC_IDC_ENUM_SESS_TYPE_INCLINIC
Brady Statistic RV Percent Paced: 0 %
Date Time Interrogation Session: 20141212050000
HighPow Impedance: 50 Ohm
Lead Channel Pacing Threshold Pulse Width: 0.4 ms
Lead Channel Sensing Intrinsic Amplitude: 11.3 mV
Lead Channel Setting Sensing Sensitivity: 0.6 mV
Zone Setting Detection Interval: 300 ms

## 2012-12-29 NOTE — Telephone Encounter (Addendum)
Pt seen 12/26/12 for ICD changeout wound check.

## 2012-12-30 ENCOUNTER — Encounter: Payer: Self-pay | Admitting: *Deleted

## 2013-01-01 ENCOUNTER — Encounter: Payer: 59 | Admitting: Internal Medicine

## 2013-01-07 ENCOUNTER — Encounter: Payer: Self-pay | Admitting: Internal Medicine

## 2013-01-30 ENCOUNTER — Encounter: Payer: Self-pay | Admitting: Internal Medicine

## 2013-02-13 ENCOUNTER — Other Ambulatory Visit: Payer: Self-pay | Admitting: *Deleted

## 2013-02-13 DIAGNOSIS — I509 Heart failure, unspecified: Secondary | ICD-10-CM

## 2013-02-13 MED ORDER — DIGOXIN 125 MCG PO TABS: 125.0000 ug | ORAL_TABLET | Freq: Every day | ORAL | Status: DC

## 2013-03-04 ENCOUNTER — Ambulatory Visit (INDEPENDENT_AMBULATORY_CARE_PROVIDER_SITE_OTHER): Payer: 59 | Admitting: Internal Medicine

## 2013-03-04 ENCOUNTER — Encounter: Payer: Self-pay | Admitting: Internal Medicine

## 2013-03-04 VITALS — BP 130/75 | HR 73 | Temp 98.0°F | Resp 16 | Ht 68.5 in | Wt 195.0 lb

## 2013-03-04 DIAGNOSIS — F17200 Nicotine dependence, unspecified, uncomplicated: Secondary | ICD-10-CM

## 2013-03-04 DIAGNOSIS — IMO0001 Reserved for inherently not codable concepts without codable children: Secondary | ICD-10-CM

## 2013-03-04 DIAGNOSIS — E785 Hyperlipidemia, unspecified: Secondary | ICD-10-CM

## 2013-03-04 DIAGNOSIS — F411 Generalized anxiety disorder: Secondary | ICD-10-CM

## 2013-03-04 DIAGNOSIS — E119 Type 2 diabetes mellitus without complications: Secondary | ICD-10-CM

## 2013-03-04 DIAGNOSIS — Z23 Encounter for immunization: Secondary | ICD-10-CM

## 2013-03-04 DIAGNOSIS — E1165 Type 2 diabetes mellitus with hyperglycemia: Principal | ICD-10-CM

## 2013-03-04 DIAGNOSIS — F418 Other specified anxiety disorders: Secondary | ICD-10-CM

## 2013-03-04 DIAGNOSIS — F341 Dysthymic disorder: Secondary | ICD-10-CM

## 2013-03-04 LAB — POCT GLYCOSYLATED HEMOGLOBIN (HGB A1C): Hemoglobin A1C: 7

## 2013-03-04 LAB — CBC WITH DIFFERENTIAL/PLATELET
Basophils Absolute: 0 10*3/uL (ref 0.0–0.1)
Basophils Relative: 0 % (ref 0–1)
EOS ABS: 0.2 10*3/uL (ref 0.0–0.7)
EOS PCT: 2 % (ref 0–5)
HCT: 49.9 % (ref 39.0–52.0)
Hemoglobin: 17.7 g/dL — ABNORMAL HIGH (ref 13.0–17.0)
LYMPHS PCT: 25 % (ref 12–46)
Lymphs Abs: 2.1 10*3/uL (ref 0.7–4.0)
MCH: 31.9 pg (ref 26.0–34.0)
MCHC: 35.5 g/dL (ref 30.0–36.0)
MCV: 89.9 fL (ref 78.0–100.0)
MONOS PCT: 7 % (ref 3–12)
Monocytes Absolute: 0.6 10*3/uL (ref 0.1–1.0)
Neutro Abs: 5.4 10*3/uL (ref 1.7–7.7)
Neutrophils Relative %: 66 % (ref 43–77)
PLATELETS: 183 10*3/uL (ref 150–400)
RBC: 5.55 MIL/uL (ref 4.22–5.81)
RDW: 14.4 % (ref 11.5–15.5)
WBC: 8.2 10*3/uL (ref 4.0–10.5)

## 2013-03-04 LAB — LIPID PANEL
CHOLESTEROL: 81 mg/dL (ref 0–200)
HDL: 36 mg/dL — ABNORMAL LOW (ref >39)
LDL CALC: 19 mg/dL (ref 0–99)
Total CHOL/HDL Ratio: 2.3 Ratio
Triglycerides: 131 mg/dL (ref <150)
VLDL: 26 mg/dL (ref 0–40)

## 2013-03-04 LAB — COMPLETE METABOLIC PANEL WITH GFR
ALT: 59 U/L — AB (ref 0–53)
AST: 33 U/L (ref 0–37)
Albumin: 4.4 g/dL (ref 3.5–5.2)
Alkaline Phosphatase: 59 U/L (ref 39–117)
BILIRUBIN TOTAL: 0.6 mg/dL (ref 0.2–1.2)
BUN: 18 mg/dL (ref 6–23)
CALCIUM: 9.1 mg/dL (ref 8.4–10.5)
CHLORIDE: 105 meq/L (ref 96–112)
CO2: 25 meq/L (ref 19–32)
CREATININE: 0.7 mg/dL (ref 0.50–1.35)
GLUCOSE: 57 mg/dL — AB (ref 70–99)
Potassium: 4 mEq/L (ref 3.5–5.3)
Sodium: 137 mEq/L (ref 135–145)
Total Protein: 7 g/dL (ref 6.0–8.3)

## 2013-03-04 MED ORDER — ALPRAZOLAM 1 MG PO TABS: 1.0000 mg | ORAL_TABLET | Freq: Two times a day (BID) | ORAL | Status: DC | PRN

## 2013-03-04 MED ORDER — "INSULIN SYRINGE-NEEDLE U-100 31G X 15/64"" 1 ML MISC": Status: DC

## 2013-03-04 MED ORDER — INSULIN NPH ISOPHANE & REGULAR (70-30) 100 UNIT/ML ~~LOC~~ SUSP: SUBCUTANEOUS | Status: DC

## 2013-03-04 MED ORDER — CLONAZEPAM 1 MG PO TABS: 1.0000 mg | ORAL_TABLET | ORAL | Status: DC | PRN

## 2013-03-04 NOTE — Patient Instructions (Signed)
Pneumococcal Polysaccharide Vaccine  What You Need to Know  PNEUMOCOCCAL DISEASE  Pneumococcal disease is caused by Streptococcus pneumoniae bacteria. It is a leading cause of vaccine-preventable illness and death in the [place]. Anyone can get pneumococcal disease, but some people are at greater risk than others:  · People 65 and older.  · The very young.  · People with certain health problems.  · People with a weakened immune system.  · Smokers.  Pneumococcal disease can lead to serious infections of the:  · Lungs (pneumonia).  · Blood (bacteremia).  · Covering of the brain (meningitis).  Pneumococcal pneumonia kills about 1 out of 20 people who get it. Bacteremia kills about 1 person in 5, and meningitis about 3 people in 10.   PNEUMOCOCCAL POLYSACCHARIDE VACCINE (PPSV)  Treatment of pneumococcal infections with penicillin and other drugs used to be more effective. However, some strains of the disease have become resistant to these drugs. This makes prevention of the disease, through vaccination, even more important.  Pneumococcal polysaccharide vaccine (PPSV) protects against 23 types of pneumococcal bacteria, including those most likely to cause serious disease.  Most healthy adults who get the vaccine develop protection to most or all of these types within 2 to 3 weeks of getting the shot. Very old people, children under 2 years of age, and people with some long-term illnesses might not respond as well, or at all.  Another type of pneumococcal vaccine (pneumococcal conjugate vaccine, or PCV) is routinely recommended for children younger than 5 years of age.   WHO SHOULD GET PPSV?  · All adults 65 years of age or older.  · Anyone 2 through 50 years of age who has a long-term health problem, such as:  · Heart disease.  · Lung disease.  · Sickle cell disease.  · Diabetes.  · Alcoholism.  · Cirrhosis.  · Leaks of cerebrospinal fluid.  · Cochlear implant.  · Anyone 2 through 50 years of age who has a disease  or condition that lowers the body's resistance to infection, such as:  · Hodgkin's disease.  · Lymphoma or leukemia.  · Kidney failure.  · Multiple myeloma.  · Nephrotic syndrome.  · HIV infection or AIDS.  · Damaged spleen or no spleen.  · Organ transplant.  · Anyone 2 through 50 years of age who is taking a drug or treatment that lowers the body's resistance to infection, such as:  · Long-term steroids.  · Certain cancer drugs.  · Radiation therapy.  · Any adult 19 through 50 years of age who:  · Is a smoker.  · Has asthma.  PPSV may be less effective for some people, especially those with lower resistance to infection. However, these people should still be vaccinated because they are more likely to have serious complications if they get pneumococcal disease.  Children who often get ear infections, sinus infections, or other upper respiratory diseases, but who are otherwise healthy, do not need to get PPSV because it is not effective against those conditions.  HOW MANY DOSES OF PPSV ARE NEEDED, AND WHEN?  Usually only 1 dose of PPSV is needed, but under some circumstances a second dose may be given.  · A second dose is recommended for people age 65 and older who got their first dose when they were younger than 65 if 5 or more years have passed since the first dose.  · A second dose is recommended for people 2 through 50 years of age   or long-term steroids). When a second dose is given, it should be given 5 years after the first dose. SOME PEOPLE SHOULD NOT GET PPSV OR SHOULD WAIT  Anyone who has had a life-threatening allergic reaction to PPSV should not get another dose.  Anyone who has a severe allergy to any  component of a vaccine should not get that vaccine. Tell your caregiver if you have any severe allergies.  Anyone who is moderately or severely ill when the shot is scheduled may be asked to wait until they recover before getting the vaccine. Someone with a mild illness can usually be vaccinated.  While there is no evidence that PPSV is harmful to either a pregnant woman or to her fetus, as a precaution, women with conditions that put them at risk for pneumococcal disease should be vaccinated before becoming pregnant, if possible. WHAT ARE THE RISKS FROM PPSV?  About half of the people who get PPSV have mild side effects, such as redness or pain where the shot is given.  Less than 1% develop a fever, muscle aches, or more severe local reactions.  A vaccine, like any medicine, can cause a serious reaction. However, the risk of a vaccine causing serious harm, or death, is extremely small. WHAT IF THERE IS A SEVERE REACTION? What should I look for? Any unusual condition, such as a high fever or behavior changes. Signs of a severe allergic reaction can include difficulty breathing, hoarseness or wheezing, hives, paleness, weakness, a fast heartbeat, or dizziness. What should I do?  Call a caregiver, or get the person to a caregiver right away.  Tell your caregiver what happened, the date and time it happened, and when the vaccination was given.  Ask your caregiver to report the reaction by filing a Vaccine Adverse Event Reporting System (VAERS) form. Or, you can file this report through the VAERS website at www.vaers.SamedayNews.es or by calling (260)105-9195. VAERS does not provide medical advice. HOW CAN I LEARN MORE?  Ask your caregiver. He or she can give you the vaccine package insert or suggest other sources of information.  Call your local or state health department.  Contact the Centers for Disease Control and Prevention (CDC):  Call 830-727-7843 (1-800-CDC-INFO).  Visit the CDC  website at http://hunter.com/ CDC Pneumococcal Polysaccharide Vaccine VIS (10/21/07) Document Released: 10/29/2005 Document Revised: 03/26/2011 Document Reviewed: 04/23/2012 [facility] Patient Information 2014 [place]. Tetanus, Diphtheria, Pertussis (Tdap) Vaccine What You Need to Know WHY GET VACCINATED? Tetanus, diphtheria and pertussis can be very serious diseases, even for adolescents and adults. Tdap vaccine can protect [place] from these diseases. TETANUS (Lockjaw) causes painful muscle tightening and stiffness, usually all over the body.  It can lead to tightening of muscles in the head and neck so you can't open your mouth, swallow, or sometimes even breathe. Tetanus kills about 1 out of 5 people who are infected. DIPHTHERIA can cause a thick coating to form in the back of the throat.  It can lead to breathing problems, paralysis, heart failure, and death. PERTUSSIS (Whooping Cough) causes severe coughing spells, which can cause difficulty breathing, vomiting and disturbed sleep.  It can also lead to weight loss, incontinence, and rib fractures. Up to 2 in 100 adolescents and 5 in 100 adults with pertussis are hospitalized or have complications, which could include pneumonia and death. These diseases are caused by bacteria. Diphtheria and pertussis are spread from person to person through coughing or sneezing. Tetanus enters the body through cuts, scratches, or wounds. Before  vaccines, the [place] saw as many as 200,000 cases a year of diphtheria and pertussis, and hundreds of cases of tetanus. Since vaccination began, tetanus and diphtheria have dropped by about 99% and pertussis by about 80%. TDAP VACCINE Tdap vaccine can protect adolescents and adults from tetanus, diphtheria, and pertussis. One dose of Tdap is routinely given at age 26 or 26. People who did not get Tdap at that age should get it as soon as possible. Tdap is especially important for health care professionals  and anyone having close contact with a baby younger than 12 months. Pregnant women should get a dose of Tdap during every pregnancy, to protect the newborn from pertussis. Infants are most at risk for severe, life-threatening complications from pertussis. A similar vaccine, called Td, protects from tetanus and diphtheria, but not pertussis. A Td booster should be given every 10 years. Tdap may be given as one of these boosters if you have not already gotten a dose. Tdap may also be given after a severe cut or burn to prevent tetanus infection. Your doctor can give you more information. Tdap may safely be given at the same time as other vaccines. SOME PEOPLE SHOULD NOT GET THIS VACCINE  If you ever had a life-threatening allergic reaction after a dose of any tetanus, diphtheria, or pertussis containing vaccine, OR if you have a severe allergy to any part of this vaccine, you should not get Tdap. Tell your doctor if you have any severe allergies.  If you had a coma, or long or multiple seizures within 7 days after a childhood dose of DTP or DTaP, you should not get Tdap, unless a cause other than the vaccine was found. You can still get Td.  Talk to your doctor if you:  have epilepsy or another nervous system problem,  had severe pain or swelling after any vaccine containing diphtheria, tetanus or pertussis,  ever had Guillain-Barr Syndrome (GBS),  aren't feeling well on the day the shot is scheduled. RISKS OF A VACCINE REACTION With any medicine, including vaccines, there is a chance of side effects. These are usually mild and go away on their own, but serious reactions are also possible. Brief fainting spells can follow a vaccination, leading to injuries from falling. Sitting or lying down for about 15 minutes can help prevent these. Tell your doctor if you feel dizzy or light-headed, or have vision changes or ringing in the ears. Mild problems following Tdap (Did not interfere with  activities)  Pain where the shot was given (about 3 in 4 adolescents or 2 in 3 adults)  Redness or swelling where the shot was given (about 1 person in 5)  Mild fever of at least 100.49F (up to about 1 in 25 adolescents or 1 in 100 adults)  Headache (about 3 or 4 people in 10)  Tiredness (about 1 person in 3 or 4)  Nausea, vomiting, diarrhea, stomach ache (up to 1 in 4 adolescents or 1 in 10 adults)  Chills, body aches, sore joints, rash, swollen glands (uncommon) Moderate problems following Tdap (Interfered with activities, but did not require medical attention)  Pain where the shot was given (about 1 in 5 adolescents or 1 in 100 adults)  Redness or swelling where the shot was given (up to about 1 in 16 adolescents or 1 in 25 adults)  Fever over 102F (about 1 in 100 adolescents or 1 in 250 adults)  Headache (about 3 in 20 adolescents or 1 in 10 adults)  Nausea, vomiting, diarrhea, stomach ache (up to 1 or 3 people in 100)  Swelling of the entire arm where the shot was given (up to about 3 in 100). Severe problems following Tdap (Unable to perform usual activities, required medical attention)  Swelling, severe pain, bleeding and redness in the arm where the shot was given (rare). A severe allergic reaction could occur after any vaccine (estimated less than 1 in a million doses). WHAT IF THERE IS A SERIOUS REACTION? What should I look for?  Look for anything that concerns you, such as signs of a severe allergic reaction, very high fever, or behavior changes. Signs of a severe allergic reaction can include hives, swelling of the face and throat, difficulty breathing, a fast heartbeat, dizziness, and weakness. These would start a few minutes to a few hours after the vaccination. What should I do?  If you think it is a severe allergic reaction or other emergency that can't wait, call 9-1-1 or get the person to the nearest hospital. Otherwise, call your doctor.  Afterward, the  reaction should be reported to the "Vaccine Adverse Event Reporting System" (VAERS). Your doctor might file this report, or you can do it yourself through the VAERS web site at www.vaers.SamedayNews.es, or by calling ([phone]. VAERS is only for reporting reactions. They do not give medical advice.  THE NATIONAL VACCINE INJURY COMPENSATION PROGRAM The National Vaccine Injury Compensation Program (VICP) is a federal program that was created to compensate people who may have been injured by certain vaccines. Persons who believe they may have been injured by a vaccine can learn about the program and about filing a claim by calling [phone] or visiting the [place] website at GoldCloset.com.ee. HOW CAN I LEARN MORE?  Ask your doctor.  Call your local or state health department.  Contact the Centers for Disease Control and Prevention (CDC):  Call 847-652-0553 or visit CDC's website at http://hunter.com/. CDC Tdap Vaccine VIS (05/24/11) Document Released: 07/03/2011 Document Revised: 04/28/2012 Document Reviewed: 04/23/2012 [facility] Patient Information 2014 [place], [place].

## 2013-03-04 NOTE — Progress Notes (Addendum)
Subjective:    Patient ID: Justin Ray, male    DOB: 10-07-1965, 48 y.o.   MRN: [id number] This chart was scribed for Leandrew Koyanagi, MD by Rolanda Lundborg, ED Scribe. This patient was seen in room 27 and the patient's care was started at 12:14 PM.  Chief Complaint  Patient presents with   Follow-up    DM    HPI HPI Comments: Justin Ray is a 48 y.o. male who presents to the [facility] for a f/u on DM and medication refill on Klonopin and Xanax. He states the pacemaker is working. He is doing home checks, sugar was 98 this morning. He has gone up to 50 units of insulin at night. Still doing 65 in the morning. He reports he has been craving sugar in the last 2 weeks and occasionally giving in. He denies weight change. He is still smoking. He had carpal tunnel surgery done on the left hand in December by Dr Amedeo Plenty but reports no improvement.  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 Dx. Code 250.00  100 each  2   ALPRAZolam (XANAX) 1 MG tablet Take 1 tablet (1 mg total) by mouth 2 (two) times daily as needed.  60 tablet  5   Ascorbic Acid (VITAMIN C) 1000 MG tablet Take 1,000 mg by mouth daily.       aspirin 81 MG tablet Take 81 mg by mouth daily.        B-D INS SYR ULTRAFINE 1CC/31G 31G X 5/16" 1 ML MISC        BD INSULIN SYRINGE ULTRAFINE 31G X 15/64" 1 ML MISC USE AS DIRECTED  100 each  10   carvedilol (COREG) 25  MG tablet Take 25 mg by mouth 2 (two) times daily with a meal.        clonazePAM (KLONOPIN) 1 MG tablet Take 2 tablets (2 mg total) by mouth as needed.  30 tablet  5   digoxin (LANOXIN) 0.125 MG tablet Take 1 tablet (125 mcg total) by mouth daily.  90 tablet  0   furosemide (LASIX) 40 MG tablet Take 40 mg by mouth as needed.       glucose blood (ACCU-CHEK AVIVA PLUS) test strip Check glucose twice daily as instructed. Dx. Code 250.00  100 each  2   insulin NPH-insulin regular (HUMULIN 70/30) (70-30) 100 UNIT/ML injection Inject 65 units each morning and 45 units each evening. Dispense 30 day supply or more if allowed. This prescription should cover one year.  30 mL  11   lisinopril (PRINIVIL,ZESTRIL) 20 MG tablet Take 20 mg by mouth 2 (two) times daily.       Needles & Syringes MISC 65 units am, 45 units pm  100 each  11   omega-3 acid ethyl esters (LOVAZA) 1 G capsule Take 2 g by mouth 2 (two) times daily.       oxyCODONE-acetaminophen (PERCOCET) 10-325 MG per tablet Take 1 tablet by mouth  every 4 (four) hours as needed for pain.  45 tablet  0   PRISTIQ 50 MG 24 hr tablet take 1 tablet by mouth once daily  30 tablet  5   Pyridoxine HCl (VITAMIN B-6) 250 MG tablet Take 250 mg by mouth daily.       rosuvastatin (CRESTOR) 10 MG tablet Take 10 mg by mouth daily.       No current facility-administered medications on file prior to visit.    Review of Systems No weight loss/no night sweats/no new fatigue No chest pain or palpitations No dyspnea on exertion  anxiety and dysthymia stable No peripheral neurological symptoms    Objective:   Physical Exam  Nursing note and vitals reviewed. Constitutional: He is oriented to person, place, and time. He appears well-developed and well-nourished. No distress.  HENT:  Head: Normocephalic and atraumatic.  Mouth/Throat: Oropharynx is clear and moist.  Eyes: Conjunctivae and EOM are normal. Pupils are equal, round, and reactive to light.    Neck: Neck supple. No thyromegaly present.  Cardiovascular: Normal rate, regular rhythm, normal heart sounds and intact distal pulses.   No murmur heard. Pulmonary/Chest: Effort normal and breath sounds normal. No respiratory distress.  Musculoskeletal: Normal range of motion. He exhibits no edema.  Lymphadenopathy:    He has no cervical adenopathy.  Neurological: He is alert and oriented to person, place, and time. He has normal reflexes. No cranial nerve deficit.  Skin: Skin is warm and dry. No rash noted.  Psychiatric: He has a normal mood and affect. His behavior is normal. Thought content normal.   foot exam normal   Filed Vitals:   03/04/13 1214  BP: 130/75  Pulse: 73  Temp: 98 F (36.7 C)  Resp: 16  Height: 5' 8.5" (1.74 m)  Weight: 195 lb (88.451 kg)  SpO2: 95%    Results for orders placed in visit on 03/04/13  POCT GLYCOSYLATED HEMOGLOBIN (HGB A1C)      Result Value Ref Range   Hemoglobin A1C 7.0       Wt Readings from Last 3 Encounters:  03/04/13 195 lb (88.451 kg)  12/19/12 197 lb 6.4 oz (89.54 kg)  12/19/12 197 lb 6.4 oz (89.54 kg)    Tdap/pneumovax given    Assessment & Plan:  Type II or unspecified type diabetes mellitus without mention of complication, uncontrolled -  Improved with new dose of insulin Other and unspecified hyperlipidemia - COMPLETE METABOLIC PANEL WITH GFR, Lipid panel  Depression with anxiety -no change  Nicotine use disorder--- once again he is very resistant to my advice to quit   Meds ordered this encounter  Medications   clonazePAM (KLONOPIN) 1 MG tablet    Sig: Take 1-2 tablets (1-2 mg total) by mouth as needed.    Dispense:  30 tablet    Refill:  5   ALPRAZolam (XANAX) 1 MG tablet    Sig: Take 1 tablet (1 mg total) by mouth 2 (two) times daily as needed.    Dispense:  60 tablet    Refill:  5   insulin NPH-regular Human (HUMULIN 70/30) (70-30) 100 UNIT/ML injection    Sig: Inject 65 units each morning and 50 units  each evening. Dispense 30 day supply or more if allowed. This prescription should cover one year.    Dispense:  40 mL    Refill:  11   Insulin Syringe-Needle U-100 (BD INSULIN SYRINGE ULTRAFINE) 31G X 15/64" 1 ML MISC    Sig: USE AS  DIRECTED with insulin bid    Dispense:  100 each    Refill:  11   Labs should be sent to his cardiologist   I have completed the patient encounter in its entirety as documented by the scribe, with editing by me where necessary. Robert P. Laney Pastor, M.D.  Results for orders placed in visit on 03/04/13  CBC WITH DIFFERENTIAL      Result Value Ref Range   WBC 8.2  4.0 - 10.5 K/uL   RBC 5.55  4.22 - 5.81 MIL/uL   Hemoglobin 17.7 (*) 13.0 - 17.0 g/dL   HCT 49.9  39.0 - 52.0 %   MCV 89.9  78.0 - 100.0 fL   MCH 31.9  26.0 - 34.0 pg   MCHC 35.5  30.0 - 36.0 g/dL   RDW 14.4  11.5 - 15.5 %   Platelets 183  150 - 400 K/uL   Neutrophils Relative % 66  43 - 77 %   Neutro Abs 5.4  1.7 - 7.7 K/uL   Lymphocytes Relative 25  12 - 46 %   Lymphs Abs 2.1  0.7 - 4.0 K/uL   Monocytes Relative 7  3 - 12 %   Monocytes Absolute 0.6  0.1 - 1.0 K/uL   Eosinophils Relative 2  0 - 5 %   Eosinophils Absolute 0.2  0.0 - 0.7 K/uL   Basophils Relative 0  0 - 1 %   Basophils Absolute 0.0  0.0 - 0.1 K/uL   Smear Review Criteria for review not met    COMPLETE METABOLIC PANEL WITH GFR      Result Value Ref Range   Sodium 137  135 - 145 mEq/L   Potassium 4.0  3.5 - 5.3 mEq/L   Chloride 105  96 - 112 mEq/L   CO2 25  19 - 32 mEq/L   Glucose, Bld 57 (*) 70 - 99 mg/dL   BUN 18  6 - 23 mg/dL   Creat 0.70  0.50 - 1.35 mg/dL   Total Bilirubin 0.6  0.2 - 1.2 mg/dL   Alkaline Phosphatase 59  39 - 117 U/L   AST 33  0 - 37 U/L   ALT 59 (*) 0 - 53 U/L   Total Protein 7.0  6.0 - 8.3 g/dL   Albumin 4.4  3.5 - 5.2 g/dL   Calcium 9.1  8.4 - 10.5 mg/dL   GFR, Est African American >89     GFR, Est Non African American >89    LIPID PANEL      Result Value Ref Range   Cholesterol 81  0 -  200 mg/dL   Triglycerides 131  <150 mg/dL   HDL 36 (*) >39 mg/dL   Total CHOL/HDL Ratio 2.3     VLDL 26  0 - 40 mg/dL   LDL Cholesterol 19  0 - 99 mg/dL  POCT GLYCOSYLATED HEMOGLOBIN (HGB A1C)      Result Value Ref Range   Hemoglobin A1C 7.0     Dr Basilio Cairo in [place] [phone]

## 2013-03-06 ENCOUNTER — Encounter: Payer: Self-pay | Admitting: Internal Medicine

## 2013-03-25 ENCOUNTER — Other Ambulatory Visit: Payer: Self-pay | Admitting: Internal Medicine

## 2013-04-01 ENCOUNTER — Encounter: Payer: 59 | Admitting: Internal Medicine

## 2013-04-13 ENCOUNTER — Other Ambulatory Visit: Payer: Self-pay

## 2013-04-14 ENCOUNTER — Encounter: Payer: Self-pay | Admitting: Internal Medicine

## 2013-04-14 ENCOUNTER — Other Ambulatory Visit: Payer: Self-pay | Admitting: Internal Medicine

## 2013-04-14 ENCOUNTER — Ambulatory Visit (INDEPENDENT_AMBULATORY_CARE_PROVIDER_SITE_OTHER): Payer: 59 | Admitting: Internal Medicine

## 2013-04-14 VITALS — BP 142/78 | HR 73 | Ht 68.0 in | Wt 201.0 lb

## 2013-04-14 DIAGNOSIS — F341 Dysthymic disorder: Secondary | ICD-10-CM

## 2013-04-14 DIAGNOSIS — I509 Heart failure, unspecified: Secondary | ICD-10-CM

## 2013-04-14 DIAGNOSIS — I5022 Chronic systolic (congestive) heart failure: Secondary | ICD-10-CM

## 2013-04-14 LAB — MDC_IDC_ENUM_SESS_TYPE_INCLINIC
Brady Statistic RV Percent Paced: 0 %
HIGH POWER IMPEDANCE MEASURED VALUE: 50 Ohm
HighPow Impedance: 62 Ohm
Implantable Pulse Generator Serial Number: 126350
Lead Channel Impedance Value: 558 Ohm
Lead Channel Pacing Threshold Amplitude: 1.3 V
Lead Channel Pacing Threshold Pulse Width: 0.4 ms
Lead Channel Sensing Intrinsic Amplitude: 13.4 mV
Lead Channel Setting Pacing Amplitude: 2.6 V
Lead Channel Setting Pacing Pulse Width: 0.4 ms
MDC IDC SESS DTM: 20150331040000
MDC IDC SET LEADCHNL RV SENSING SENSITIVITY: 0.6 mV
MDC IDC SET ZONE DETECTION INTERVAL: 300 ms
MDC IDC SET ZONE DETECTION INTERVAL: 333 ms
Zone Setting Detection Interval: 250 ms

## 2013-04-14 MED ORDER — CARVEDILOL 25 MG PO TABS: 25.0000 mg | ORAL_TABLET | Freq: Two times a day (BID) | ORAL | Status: DC

## 2013-04-14 MED ORDER — LISINOPRIL 20 MG PO TABS: 20.0000 mg | ORAL_TABLET | Freq: Two times a day (BID) | ORAL | Status: DC

## 2013-04-14 MED ORDER — FUROSEMIDE 40 MG PO TABS: 40.0000 mg | ORAL_TABLET | ORAL | Status: DC | PRN

## 2013-04-14 MED ORDER — ROSUVASTATIN CALCIUM 10 MG PO TABS: 10.0000 mg | ORAL_TABLET | Freq: Every day | ORAL | Status: DC

## 2013-04-14 MED ORDER — DIGOXIN 125 MCG PO TABS: 125.0000 ug | ORAL_TABLET | Freq: Every day | ORAL | Status: DC

## 2013-04-14 NOTE — Patient Instructions (Signed)

## 2013-04-14 NOTE — Progress Notes (Signed)
HPI [name] returns today for followup. He is a very pleasant 48 year old man with chronic systolic heart failure, class II, a nonischemic cardiomyopathy, status post ICD implantation. In the interim, he has done well. He continues to work full-time and denies chest pain or shortness of breath. He has not had palpitations. No syncope. No edema. Allergies  Allergen Reactions  . Codeine Other (See Comments)    Severe HA's.  . Niaspan [Niacin Er] Other (See Comments)    Per pt feels like skin is burning( REDNESS), and pins/needles are sticking   . Sulfonamide Derivatives Other (See Comments)    thrush     Current Outpatient Prescriptions  Medication Sig Dispense Refill  . ALPRAZolam (XANAX) 1 MG tablet Take 1 tablet (1 mg total) by mouth 2 (two) times daily as needed.  60 tablet  5  . aspirin 81 MG tablet Take 81 mg by mouth daily.       Marland Kitchen buPROPion (WELLBUTRIN) 75 MG tablet Take 1 tablet by mouth 2 (two) times daily.      . carvedilol (COREG) 25 MG tablet Take 25 mg by mouth 2 (two) times daily with a meal.       . clonazePAM (KLONOPIN) 1 MG tablet Take 1-2 tablets (1-2 mg total) by mouth as needed.  30 tablet  5  . digoxin (LANOXIN) 0.125 MG tablet Take 1 tablet (125 mcg total) by mouth daily.  90 tablet  0  . furosemide (LASIX) 40 MG tablet Take 40 mg by mouth as needed.      . insulin NPH-regular Human (HUMULIN 70/30) (70-30) 100 UNIT/ML injection Inject 65 units each morning and 50 units each evening. Dispense 30 day supply or more if allowed. This prescription should cover one year.  40 mL  11  . lisinopril (PRINIVIL,ZESTRIL) 20 MG tablet Take 20 mg by mouth 2 (two) times daily.      . meloxicam (MOBIC) 15 MG tablet Take 1 tablet by mouth daily.      Marland Kitchen omega-3 acid ethyl esters (LOVAZA) 1 G capsule Take 2 g by mouth 2 (two) times daily.      Marland Kitchen PRISTIQ 50 MG 24 hr tablet take 1 tablet by mouth once daily  30 tablet  4  . rosuvastatin (CRESTOR) 10 MG tablet Take 10 mg by mouth  daily.       No current facility-administered medications for this visit.     Past Medical History  Diagnosis Date  . CHF (congestive heart failure)   . Cardiomyopathy   . Dyslipidemia   . Diabetes mellitus   . Anxiety   . Hypertension   . Cataract   . Wears glasses   . Sleep apnea     was tested many yr ago-does not use a cpap-  . ICD (implantable cardioverter-defibrillator) in place   . Carpal tunnel syndrome 10/14    right-GSC    ROS:   All systems reviewed and negative except as noted in the HPI.   Past Surgical History  Procedure Laterality Date  . Cardiac defibrillator placement  02/01/2004  . Tonsillectomy    . Eye surgery    . Cardiac defibrillator placement  11/14    replaced with new one  . Carpal tunnel release Left 12/19/2012    Procedure: LEFT CARPAL TUNNEL RELEASE;  Surgeon: Roseanne Kaufman, MD;  Location: [place];  Service: Orthopedics;  Laterality: Left;     Family History  Problem Relation Age of Onset  .  Cancer    . Coronary artery disease Father     CABG  . Cancer Mother     colon  . Heart disease Brother      History   Social History  . Marital Status: Widowed    Spouse Name: N/A    Number of Children: 0  . Years of Education: N/A   Occupational History  . maintenance Unemployed   Social History Main Topics  . Smoking status: Current Every Day Smoker -- 1.50 packs/day    Types: Cigarettes  . Smokeless tobacco: Not on file  . Alcohol Use: No  . Drug Use: No  . Sexual Activity: No     Comment: number of sex partners in the last 12 months  0   Other Topics Concern  . Not on file   Social History Narrative  . No narrative on file     BP 142/78  Pulse 73  Ht 5\' 8"  (1.727 m)  Wt 201 lb (91.173 kg)  BMI 30.57 kg/m2  Physical Exam:  Well appearing 48 year old man, NAD HEENT: Unremarkable Neck:  7 cm JVD, no thyromegally Lungs:  Clear with no wheezes, rales, or rhonchi. HEART:  Regular rate rhythm, no  murmurs, no rubs, no clicks Abd:  soft, positive bowel sounds, no organomegally, no rebound, no guarding Ext:  2 plus pulses, no edema, no cyanosis, no clubbing Skin:  No rashes no nodules Neuro:  CN II through XII intact, motor grossly intact  EKG normal sinus rhythm with LVH  DEVICE  Normal device function.  See PaceArt for details.   Assess/Plan:

## 2013-04-14 NOTE — Assessment & Plan Note (Signed)
His symptoms are class 2A, and we will not change his medication. He is encouraged to maintain a low sodium diet.

## 2013-04-14 NOTE — Assessment & Plan Note (Signed)
His [employer] ICD is working normally. Will recheck in severeal months.

## 2013-04-22 ENCOUNTER — Encounter: Payer: Self-pay | Admitting: Internal Medicine

## 2013-04-23 ENCOUNTER — Other Ambulatory Visit: Payer: Self-pay

## 2013-06-03 ENCOUNTER — Ambulatory Visit (INDEPENDENT_AMBULATORY_CARE_PROVIDER_SITE_OTHER): Payer: 59 | Admitting: Internal Medicine

## 2013-06-03 ENCOUNTER — Encounter: Payer: Self-pay | Admitting: Internal Medicine

## 2013-06-03 VITALS — BP 123/77 | HR 85 | Temp 98.5°F | Resp 16 | Ht 68.0 in | Wt 192.0 lb

## 2013-06-03 DIAGNOSIS — J029 Acute pharyngitis, unspecified: Secondary | ICD-10-CM

## 2013-06-03 DIAGNOSIS — E119 Type 2 diabetes mellitus without complications: Secondary | ICD-10-CM

## 2013-06-03 DIAGNOSIS — R059 Cough, unspecified: Secondary | ICD-10-CM

## 2013-06-03 DIAGNOSIS — R05 Cough: Secondary | ICD-10-CM

## 2013-06-03 DIAGNOSIS — E785 Hyperlipidemia, unspecified: Secondary | ICD-10-CM

## 2013-06-03 LAB — CBC WITH DIFFERENTIAL/PLATELET
Basophils Absolute: 0 10*3/uL (ref 0.0–0.1)
Basophils Relative: 0 % (ref 0–1)
Eosinophils Absolute: 0.1 10*3/uL (ref 0.0–0.7)
Eosinophils Relative: 1 % (ref 0–5)
HCT: 48.3 % (ref 39.0–52.0)
Hemoglobin: 17.3 g/dL — ABNORMAL HIGH (ref 13.0–17.0)
LYMPHS PCT: 14 % (ref 12–46)
Lymphs Abs: 1.9 10*3/uL (ref 0.7–4.0)
MCH: 31.3 pg (ref 26.0–34.0)
MCHC: 35.8 g/dL (ref 30.0–36.0)
MCV: 87.3 fL (ref 78.0–100.0)
Monocytes Absolute: 1.1 10*3/uL — ABNORMAL HIGH (ref 0.1–1.0)
Monocytes Relative: 8 % (ref 3–12)
NEUTROS ABS: 10.6 10*3/uL — AB (ref 1.7–7.7)
Neutrophils Relative %: 77 % (ref 43–77)
PLATELETS: 188 10*3/uL (ref 150–400)
RBC: 5.53 MIL/uL (ref 4.22–5.81)
RDW: 13.5 % (ref 11.5–15.5)
WBC: 13.8 10*3/uL — AB (ref 4.0–10.5)

## 2013-06-03 LAB — COMPREHENSIVE METABOLIC PANEL
ALBUMIN: 4.4 g/dL (ref 3.5–5.2)
ALT: 52 U/L (ref 0–53)
AST: 28 U/L (ref 0–37)
Alkaline Phosphatase: 68 U/L (ref 39–117)
BUN: 14 mg/dL (ref 6–23)
CALCIUM: 9 mg/dL (ref 8.4–10.5)
CHLORIDE: 104 meq/L (ref 96–112)
CO2: 25 mEq/L (ref 19–32)
Creat: 0.83 mg/dL (ref 0.50–1.35)
Glucose, Bld: 135 mg/dL — ABNORMAL HIGH (ref 70–99)
POTASSIUM: 4.4 meq/L (ref 3.5–5.3)
SODIUM: 137 meq/L (ref 135–145)
TOTAL PROTEIN: 7.2 g/dL (ref 6.0–8.3)
Total Bilirubin: 0.8 mg/dL (ref 0.2–1.2)

## 2013-06-03 LAB — LIPID PANEL
Cholesterol: 95 mg/dL (ref 0–200)
HDL: 32 mg/dL — AB (ref >39)
LDL Cholesterol: 21 mg/dL (ref 0–99)
Total CHOL/HDL Ratio: 3 Ratio
Triglycerides: 209 mg/dL — ABNORMAL HIGH (ref <150)
VLDL: 42 mg/dL — ABNORMAL HIGH (ref 0–40)

## 2013-06-03 LAB — POCT GLYCOSYLATED HEMOGLOBIN (HGB A1C): Hemoglobin A1C: 7.6

## 2013-06-03 LAB — POCT RAPID STREP A (OFFICE): RAPID STREP A SCREEN: NEGATIVE

## 2013-06-03 MED ORDER — AZITHROMYCIN 250 MG PO TABS: ORAL_TABLET | ORAL | Status: DC

## 2013-06-03 NOTE — Progress Notes (Signed)
Subjective:  This chart was scribed for Tami Lin, MD by Roxan Diesel, Scribe.  This patient was seen in [facility] and the patient's care was started at 12:04 PM.   Patient ID: Justin Ray, male    DOB: [birth year], 48 y.o.   MRN: [id number]  HPI  HPI Comments: Justin Ray is a 48 y.o. male who presents to [facility] for a f/u on DM.  At his last visit he was doing well on his insulin and he states his DM has continued to be well-controlled to his knowledge.    He feels he is doing pretty well overall and his chronic medical conditions have been stable to his knowledge.  Currently he feels he may be in the early stages of bronchitis.  Over the past 2 days he has had cough, congestion and sore throat which all began at about the same time and worsened this morning.  Cough is productive of yellow sputum.  He denies fever.   Patient Active Problem List   Diagnosis Date Noted  . Chronic systolic heart failure 48/54/6270  . Anxiety state, unspecified 09/05/2011  . Dysthymic disorder 02/28/2011  . COPD with asthma 02/28/2011  . Nicotine use disorder 02/28/2011  . Insomnia, unspecified 02/28/2011  . Automatic implantable cardioverter-defibrillator in situ 02/06/2011  . DM 01/26/2009  . DYSLIPIDEMIA 01/26/2009  . CARDIOMYOPATHY 01/26/2009  . CHF 01/26/2009    Past Medical History  Diagnosis Date  . CHF (congestive heart failure)   . Cardiomyopathy   . Dyslipidemia   . Diabetes mellitus   . Anxiety   . Hypertension   . Cataract   . Wears glasses   . Sleep apnea     was tested many yr ago-does not use a cpap-  . ICD (implantable cardioverter-defibrillator) in place   . Carpal tunnel syndrome 10/14    right-GSC    Past Surgical History  Procedure Laterality Date  . Cardiac defibrillator placement  02/01/2004  . Tonsillectomy    . Eye surgery    . Cardiac defibrillator placement  11/14    replaced with new one  . Carpal tunnel release Left 12/19/2012   Procedure: LEFT CARPAL TUNNEL RELEASE;  Surgeon: Roseanne Kaufman, MD;  Location: [place];  Service: Orthopedics;  Laterality: Left;    Prior to Admission medications   Medication Sig Start Date End Date Taking? Authorizing Provider  ALPRAZolam Duanne Moron) 1 MG tablet Take 1 tablet (1 mg total) by mouth 2 (two) times daily as needed. 03/04/13  Yes Leandrew Koyanagi, MD  aspirin 81 MG tablet Take 81 mg by mouth daily.    Yes Historical Provider, MD  carvedilol (COREG) 25 MG tablet Take 1 tablet (25 mg total) by mouth 2 (two) times daily with a meal. 04/14/13  Yes Evans Lance, MD  clonazePAM (KLONOPIN) 1 MG tablet Take 1-2 tablets (1-2 mg total) by mouth as needed. 03/04/13  Yes Leandrew Koyanagi, MD  digoxin (LANOXIN) 0.125 MG tablet Take 1 tablet (125 mcg total) by mouth daily. 04/14/13  Yes Evans Lance, MD  furosemide (LASIX) 40 MG tablet Take 1 tablet (40 mg total) by mouth as needed. 04/14/13  Yes Evans Lance, MD  insulin NPH-regular Human (HUMULIN 70/30) (70-30) 100 UNIT/ML injection Inject 65 units each morning and 50 units each evening. Dispense 30 day supply or more if allowed. This prescription should cover one year. 03/04/13  Yes Leandrew Koyanagi, MD  lisinopril (PRINIVIL,ZESTRIL) 20 MG tablet Take  1 tablet (20 mg total) by mouth 2 (two) times daily. 04/14/13  Yes Evans Lance, MD  omega-3 acid ethyl esters (LOVAZA) 1 G capsule Take 2 g by mouth 2 (two) times daily.   Yes Historical Provider, MD  PRISTIQ 50 MG 24 hr tablet take 1 tablet by mouth once daily 03/25/13  Yes Leandrew Koyanagi, MD  rosuvastatin (CRESTOR) 10 MG tablet Take 1 tablet (10 mg total) by mouth daily. 04/14/13  Yes Evans Lance, MD     Review of Systems  Constitutional: Negative for fever.  HENT: Positive for congestion and sore throat.   Respiratory: Positive for cough.         Objective:   Physical Exam  Nursing note and vitals reviewed. Constitutional: He is oriented to person, place,  and time. He appears well-developed and well-nourished. No distress.  HENT:  Head: Normocephalic and atraumatic.  Mouth/Throat: Oropharyngeal exudate, posterior oropharyngeal edema and posterior oropharyngeal erythema present.  Turbinates are boggy under the nose. Throat is red and swollen with slight exudate.  No AC nodes.  Eyes: EOM are normal.  Neck: Neck supple.  Cardiovascular: Normal rate.   Pulmonary/Chest: Effort normal. No respiratory distress. He has rhonchi.  Rhonchi anteriorly that clear with coughing.  Musculoskeletal: Normal range of motion.  Neurological: He is alert and oriented to person, place, and time.  Skin: Skin is warm and dry.  Psychiatric: He has a normal mood and affect. His behavior is normal.     BP 123/77  Pulse 85  Temp(Src) 98.5 F (36.9 C)  Resp 16  Ht 5\' 8"  (1.727 m)  Wt 192 lb (87.091 kg)  BMI 29.20 kg/m2  SpO2 93%  Results for orders placed in visit on 06/03/13  POCT GLYCOSYLATED HEMOGLOBIN (HGB A1C)      Result Value Ref Range   Hemoglobin A1C 7.6    POCT RAPID STREP A (OFFICE)      Result Value Ref Range   Rapid Strep A Screen Negative  Negative        Assessment & Plan:     I have completed the patient encounter in its entirety as documented by the scribe, with editing by me where necessary. Robert P. Laney Pastor, M.D. Type II or unspecified type diabetes mellitus without mention of complication, not stated as uncontrolled - Plan: HM Diabetes Foot Exam, CBC with Differential, Comprehensive metabolic panel, POCT glycosylated hemoglobin (Hb A1C)  DYSLIPIDEMIA - Plan: Lipid panel  Acute pharyngitis - Plan: POCT rapid strep A, Culture, Group A Strep  Cough  Meds ordered this encounter  Medications  . azithromycin (ZITHROMAX) 250 MG tablet    Sig: As packaged    Dispense:  6 tablet    Refill:  0   F/u [facility] Labs to: Dr Basilio Cairo in [place]  [phone] and [phone]

## 2013-06-05 LAB — CULTURE, GROUP A STREP: Organism ID, Bacteria: NORMAL

## 2013-06-06 ENCOUNTER — Encounter: Payer: Self-pay | Admitting: Internal Medicine

## 2013-07-16 ENCOUNTER — Encounter: Payer: Self-pay | Admitting: Internal Medicine

## 2013-07-16 ENCOUNTER — Ambulatory Visit (INDEPENDENT_AMBULATORY_CARE_PROVIDER_SITE_OTHER): Payer: 59 | Admitting: *Deleted

## 2013-07-16 DIAGNOSIS — I428 Other cardiomyopathies: Secondary | ICD-10-CM

## 2013-07-16 LAB — MDC_IDC_ENUM_SESS_TYPE_REMOTE
Brady Statistic RV Percent Paced: 0 %
HighPow Impedance: 58 Ohm
Implantable Pulse Generator Serial Number: 126350
Lead Channel Impedance Value: 522 Ohm
Lead Channel Setting Pacing Amplitude: 2.6 V
Lead Channel Setting Pacing Pulse Width: 0.4 ms
MDC IDC MSMT LEADCHNL RV SENSING INTR AMPL: 12.3 mV
MDC IDC SET LEADCHNL RV SENSING SENSITIVITY: 0.6 mV
MDC IDC SET ZONE DETECTION INTERVAL: 300 ms
Zone Setting Detection Interval: 250 ms
Zone Setting Detection Interval: 333 ms

## 2013-07-16 NOTE — Progress Notes (Signed)
Remote ICD transmission.   

## 2013-07-24 ENCOUNTER — Other Ambulatory Visit: Payer: Self-pay | Admitting: Internal Medicine

## 2013-08-03 ENCOUNTER — Encounter: Payer: Self-pay | Admitting: Cardiology

## 2013-08-05 ENCOUNTER — Encounter: Payer: Self-pay | Admitting: Cardiology

## 2013-08-12 ENCOUNTER — Other Ambulatory Visit: Payer: Self-pay | Admitting: Internal Medicine

## 2013-08-13 NOTE — Telephone Encounter (Signed)
Dr Laney Pastor, I don't see this med specifically addressed in recent OVs. Do you want to give RFs?

## 2013-09-02 ENCOUNTER — Ambulatory Visit (INDEPENDENT_AMBULATORY_CARE_PROVIDER_SITE_OTHER): Payer: 59 | Admitting: Internal Medicine

## 2013-09-02 ENCOUNTER — Encounter: Payer: Self-pay | Admitting: Internal Medicine

## 2013-09-02 VITALS — BP 130/80 | HR 73 | Temp 98.9°F | Resp 16 | Ht 68.5 in | Wt 194.2 lb

## 2013-09-02 DIAGNOSIS — Z23 Encounter for immunization: Secondary | ICD-10-CM

## 2013-09-02 DIAGNOSIS — F17200 Nicotine dependence, unspecified, uncomplicated: Secondary | ICD-10-CM

## 2013-09-02 DIAGNOSIS — F418 Other specified anxiety disorders: Secondary | ICD-10-CM

## 2013-09-02 DIAGNOSIS — F411 Generalized anxiety disorder: Secondary | ICD-10-CM

## 2013-09-02 DIAGNOSIS — G4700 Insomnia, unspecified: Secondary | ICD-10-CM

## 2013-09-02 DIAGNOSIS — F341 Dysthymic disorder: Secondary | ICD-10-CM

## 2013-09-02 DIAGNOSIS — E119 Type 2 diabetes mellitus without complications: Secondary | ICD-10-CM

## 2013-09-02 LAB — POCT GLYCOSYLATED HEMOGLOBIN (HGB A1C): Hemoglobin A1C: 9.3

## 2013-09-02 MED ORDER — MIRTAZAPINE 30 MG PO TABS: 30.0000 mg | ORAL_TABLET | Freq: Every day | ORAL | Status: DC

## 2013-09-02 MED ORDER — TRIAZOLAM 0.25 MG PO TABS: 0.2500 mg | ORAL_TABLET | Freq: Every evening | ORAL | Status: DC | PRN

## 2013-09-02 NOTE — Patient Instructions (Signed)
Take BS before breakfast and before dinner(before you give your insulin) If am sugar >140 then go up on the evening dose by 2 units If evening sugar>140 go up on morning dose by 2 units Adjust the insulin until your sugars are all below 140 each day

## 2013-09-02 NOTE — Progress Notes (Signed)
Subjective:    Patient ID: Justin Ray, male    DOB: [birth year], 48 y.o.   MRN: [id number] This chart was scribed for Leandrew Koyanagi, MD by Rosary Lively, ED scribe. This patient was seen in room Room/bed 28 and the patient's care was started at 1:26 PM.   HPI HPI Comments:  Justin Ray is a 48 y.o. male who presents to [facility] for a follow- up visit concerning A1C/psych issues  Pt reports that he often craves sweets, and that he often feels thirsty. Pt reports that he does not take his blood sugar regularly. Pt reports that he currently takes insulin in the morning, and then again at 7:00-8:00PM. Pt reports increased stress over the last month concerning his personal life, along with work.   Pt reports that he has lost interest in some of the activities that he used to enjoy. Pt reports that he does not sleep well, and that he wakes up with worries, and is restless. Pt states that he cannot sleep more than 1-2 hours without waking up. Pt is aware that he has sleep apnea, however he denies waking up not breathing. Pt has not had a sleep study performed in approximately 8 years. Pt denies having issues on drive on the way home. Pt reports taking Xanax rarely. Pt reports that the Klonopin causes him to be sleepy during the day. Has been on pristiq following effexor for long time. His depression symptoms were exacerbated 6 years ago following the death of his wife. He complains that rarely does a day go by without several instances of continuing to mourn her loss creating very depressed mood and interfering with all daily activities including work. Therapy with hospice aftercare was not successful in his home community because he fell that he could not be honest with his emotional state which often considered suicide. He is more stable now but miserable most of the time  Pt reports that Pneumonia and Tdap are up to date. Pt accepts Flu vaccine.    Review of Systems  Constitutional: Negative  for unexpected weight change.       He eats to console depression  Eyes: Negative for visual disturbance.  Respiratory: Negative for chest tightness and shortness of breath.   Cardiovascular: Negative for chest pain, palpitations and leg swelling.       Note recent pacer check  Gastrointestinal: Negative for abdominal pain.  Genitourinary: Negative for difficulty urinating.  Neurological: Negative for headaches.  Psychiatric/Behavioral: Positive for sleep disturbance and dysphoric mood. Negative for confusion. The patient is nervous/anxious.        Objective:   Physical Exam  Nursing note and vitals reviewed. Constitutional: He is oriented to person, place, and time. He appears well-developed and well-nourished.  HENT:  Head: Normocephalic and atraumatic.  Eyes: EOM are normal. Pupils are equal, round, and reactive to light.  Neck: Normal range of motion. Neck supple.  Cardiovascular: Normal rate and regular rhythm.   Pulmonary/Chest: Effort normal and breath sounds normal.  Musculoskeletal: Normal range of motion.  Neurological: He is alert and oriented to person, place, and time. No cranial nerve deficit.  Skin: Skin is warm and dry.  Psychiatric:  Depressed mood/affect shows result in the face of this Contents normal of thought     Results for orders placed in visit on 09/02/13  POCT GLYCOSYLATED HEMOGLOBIN (HGB A1C)      Result Value Ref Range   Hemoglobin A1C 9.3  Assessment & Plan:   Patient Active Problem List   Diagnosis Date Noted  . DM 01/26/2009    Priority: High  . CARDIOMYOPATHY 01/26/2009    Priority: High  . CHF 01/26/2009    Priority: High  . Dysthymic disorder 02/28/2011    Priority: Medium  . Automatic implantable cardioverter-defibrillator in situ 02/06/2011    Priority: Medium  . DYSLIPIDEMIA 01/26/2009    Priority: Medium  . Chronic systolic heart failure 11/94/1740  . Anxiety state, unspecified 09/05/2011  . COPD with asthma  02/28/2011  . Nicotine use disorder 02/28/2011  . Insomnia, unspecified 02/28/2011   Not time to recheck lipids because stable at last visit. Elevation in hemoglobin A1c documents loss of control particularly the eating-he doesn't check home blood sugars at this point  We will start a sliding scale to increase his insulin slowly based on morning and evening blood sugars over the next month  Most important to focus on his psychological disability  Referred for cognitive behavioral therapy   Clonopin makes him too groggy the next day to use for sleep//Will try Halcion  Add Remeron  Call results 3-4 wk F/u 3 mos Send ov to cardiol Dr Geraldo Pitter in [place] fax [phone] Meds ordered this encounter  Medications  . triazolam (HALCION) 0.25 MG tablet    Sig: Take 1 tablet (0.25 mg total) by mouth at bedtime as needed for sleep.    Dispense:  30 tablet    Refill:  2  . mirtazapine (REMERON) 30 MG tablet    Sig: Take 1 tablet (30 mg total) by mouth at bedtime.    Dispense:  30 tablet    Refill:  2    . I have completed the patient encounter in its entirety as documented by the scribe, with editing by me where necessary. Quinlan Mcfall P. Laney Pastor, M.D.

## 2013-09-15 ENCOUNTER — Encounter: Payer: Self-pay | Admitting: Internal Medicine

## 2013-10-19 ENCOUNTER — Ambulatory Visit (INDEPENDENT_AMBULATORY_CARE_PROVIDER_SITE_OTHER): Payer: 59 | Admitting: *Deleted

## 2013-10-19 ENCOUNTER — Telehealth: Payer: Self-pay

## 2013-10-19 ENCOUNTER — Encounter: Payer: Self-pay | Admitting: Internal Medicine

## 2013-10-19 DIAGNOSIS — I429 Cardiomyopathy, unspecified: Secondary | ICD-10-CM

## 2013-10-19 DIAGNOSIS — I428 Other cardiomyopathies: Secondary | ICD-10-CM

## 2013-10-19 LAB — MDC_IDC_ENUM_SESS_TYPE_REMOTE
HighPow Impedance: 62 Ohm
Implantable Pulse Generator Serial Number: 126350
Lead Channel Impedance Value: 537 Ohm
Lead Channel Sensing Intrinsic Amplitude: 12.1 mV
Lead Channel Setting Pacing Pulse Width: 0.4 ms
Lead Channel Setting Sensing Sensitivity: 0.6 mV
MDC IDC MSMT BATTERY REMAINING LONGEVITY: 144 mo
MDC IDC SET LEADCHNL RV PACING AMPLITUDE: 2.6 V
MDC IDC SET ZONE DETECTION INTERVAL: 250 ms
MDC IDC SET ZONE DETECTION INTERVAL: 333 ms
MDC IDC STAT BRADY RV PERCENT PACED: 0 %
Zone Setting Detection Interval: 300 ms

## 2013-10-19 MED ORDER — GLUCOSE BLOOD VI STRP: ORAL_STRIP | Status: DC

## 2013-10-19 NOTE — Telephone Encounter (Signed)
Pt went to the pharmacy for his test scripts was told he needed to call us. Please call pt at [phone] need the Lantus also    Kendall [place]

## 2013-10-19 NOTE — Progress Notes (Signed)
Remote ICD transmission.   

## 2013-10-19 NOTE — Telephone Encounter (Signed)
LM for pt rtn call- Pt on Lantus? Sent strips script to pharmacy.

## 2013-10-19 NOTE — Addendum Note (Signed)
Addended by: Jethro Bolus A on: 10/19/2013 02:57 PM   Modules accepted: Orders

## 2013-10-20 MED ORDER — ONETOUCH ULTRASOFT LANCETS MISC: Status: DC

## 2013-10-20 MED ORDER — GLUCOSE BLOOD VI STRP: ORAL_STRIP | Status: DC

## 2013-10-20 NOTE — Addendum Note (Signed)
Addended by: Jethro Bolus A on: 10/20/2013 09:28 AM   Modules accepted: Orders

## 2013-10-20 NOTE — Telephone Encounter (Signed)
Patient called- he needed lancets not lantus. Resent script to the pharmacy- pt needed number of times to test on the prescription.

## 2013-10-22 ENCOUNTER — Other Ambulatory Visit: Payer: Self-pay | Admitting: *Deleted

## 2013-10-22 MED ORDER — ACCU-CHEK AVIVA CONNECT W/DEVICE KIT: 1.0000 | PACK | Freq: Three times a day (TID) | Status: DC

## 2013-10-22 MED ORDER — GLUCOSE BLOOD VI STRP: ORAL_STRIP | Status: DC

## 2013-10-22 NOTE — Telephone Encounter (Signed)
Fax received from pharmacy. Pt insurance will fill [employer] machine and supplies they needed a script with these listed. Canceled order for Freestyle.

## 2013-10-30 ENCOUNTER — Encounter: Payer: Self-pay | Admitting: Cardiology

## 2013-11-18 ENCOUNTER — Encounter: Payer: Self-pay | Admitting: Cardiology

## 2013-12-02 ENCOUNTER — Other Ambulatory Visit: Payer: Self-pay | Admitting: Internal Medicine

## 2013-12-02 ENCOUNTER — Ambulatory Visit: Payer: 59 | Admitting: Internal Medicine

## 2013-12-09 ENCOUNTER — Ambulatory Visit (INDEPENDENT_AMBULATORY_CARE_PROVIDER_SITE_OTHER): Payer: 59 | Admitting: Internal Medicine

## 2013-12-09 ENCOUNTER — Encounter: Payer: Self-pay | Admitting: Internal Medicine

## 2013-12-09 VITALS — BP 134/81 | HR 73 | Temp 98.0°F | Resp 16 | Ht 68.0 in | Wt 194.0 lb

## 2013-12-09 DIAGNOSIS — F418 Other specified anxiety disorders: Secondary | ICD-10-CM

## 2013-12-09 DIAGNOSIS — G4700 Insomnia, unspecified: Secondary | ICD-10-CM

## 2013-12-09 DIAGNOSIS — B351 Tinea unguium: Secondary | ICD-10-CM

## 2013-12-09 DIAGNOSIS — Z125 Encounter for screening for malignant neoplasm of prostate: Secondary | ICD-10-CM

## 2013-12-09 DIAGNOSIS — IMO0002 Reserved for concepts with insufficient information to code with codable children: Secondary | ICD-10-CM

## 2013-12-09 DIAGNOSIS — F411 Generalized anxiety disorder: Secondary | ICD-10-CM

## 2013-12-09 DIAGNOSIS — E1365 Other specified diabetes mellitus with hyperglycemia: Secondary | ICD-10-CM

## 2013-12-09 DIAGNOSIS — L600 Ingrowing nail: Secondary | ICD-10-CM

## 2013-12-09 DIAGNOSIS — F341 Dysthymic disorder: Secondary | ICD-10-CM

## 2013-12-09 LAB — COMPREHENSIVE METABOLIC PANEL
ALT: 62 U/L — ABNORMAL HIGH (ref 0–53)
AST: 34 U/L (ref 0–37)
Albumin: 4.6 g/dL (ref 3.5–5.2)
Alkaline Phosphatase: 67 U/L (ref 39–117)
BILIRUBIN TOTAL: 0.6 mg/dL (ref 0.2–1.2)
BUN: 12 mg/dL (ref 6–23)
CO2: 25 mEq/L (ref 19–32)
CREATININE: 0.86 mg/dL (ref 0.50–1.35)
Calcium: 9.5 mg/dL (ref 8.4–10.5)
Chloride: 108 mEq/L (ref 96–112)
Glucose, Bld: 75 mg/dL (ref 70–99)
Potassium: 4.5 mEq/L (ref 3.5–5.3)
Sodium: 142 mEq/L (ref 135–145)
Total Protein: 7.4 g/dL (ref 6.0–8.3)

## 2013-12-09 LAB — LIPID PANEL
Cholesterol: 74 mg/dL (ref 0–200)
HDL: 35 mg/dL — AB (ref >39)
LDL CALC: 13 mg/dL (ref 0–99)
TRIGLYCERIDES: 131 mg/dL (ref <150)
Total CHOL/HDL Ratio: 2.1 Ratio
VLDL: 26 mg/dL (ref 0–40)

## 2013-12-09 LAB — CBC WITH DIFFERENTIAL/PLATELET

## 2013-12-09 LAB — POCT GLYCOSYLATED HEMOGLOBIN (HGB A1C): Hemoglobin A1C: 7.4

## 2013-12-09 MED ORDER — MIRTAZAPINE 45 MG PO TABS
45.0000 mg | ORAL_TABLET | Freq: Every day | ORAL | Status: DC
Start: 2013-12-09 — End: 2015-02-11

## 2013-12-09 MED ORDER — ALPRAZOLAM 1 MG PO TABS: 1.0000 mg | ORAL_TABLET | Freq: Two times a day (BID) | ORAL | Status: DC | PRN

## 2013-12-09 MED ORDER — TRIAZOLAM 0.25 MG PO TABS: 0.2500 mg | ORAL_TABLET | Freq: Every evening | ORAL | Status: DC | PRN

## 2013-12-09 NOTE — Progress Notes (Signed)
F/u Patient Active Problem List   Diagnosis Date Noted  . DM 01/26/2009    Priority: High  . CARDIOMYOPATHY 01/26/2009    Priority: High  . CHF 01/26/2009    Priority: High  . Dysthymic disorder 02/28/2011    Priority: Medium  . Automatic implantable cardioverter-defibrillator in situ 02/06/2011    Priority: Medium  . Chronic systolic heart failure 82/99/3716  . Anxiety state 09/05/2011  . COPD with asthma 02/28/2011  . Nicotine use disorder 02/28/2011  . Insomnia 02/28/2011   Prior to Admission medications   Medication Sig Start Date End Date Taking? Authorizing Provider  ALPRAZolam Duanne Moron) 1 MG tablet Take 1 tablet (1 mg total) by mouth 2 (two) times daily as needed. 03/04/13   Leandrew Koyanagi, MD  aspirin 81 MG tablet Take 81 mg by mouth daily.     Historical Provider, MD  BD INSULIN SYRINGE ULTRAFINE 31G X 15/64" 1 ML MISC  10/11/13   Historical Provider, MD  Blood Glucose Monitoring Suppl (ACCU-CHEK AVIVA CONNECT) W/DEVICE KIT 1 each by Does not apply route 3 (three) times daily. Test three times daily. DX code 250.00/ E11.9 10/22/13   Mancel Bale, PA-C  carvedilol (COREG) 25 MG tablet Take 1 tablet (25 mg total) by mouth 2 (two) times daily with a meal. 04/14/13   Evans Lance, MD  clonazePAM (KLONOPIN) 1 MG tablet Take 1-2 tablets (1-2 mg total) by mouth as needed. 03/04/13   Leandrew Koyanagi, MD  digoxin (LANOXIN) 0.125 MG tablet Take 1 tablet (125 mcg total) by mouth daily. 04/14/13   Evans Lance, MD  furosemide (LASIX) 40 MG tablet Take 1 tablet (40 mg total) by mouth as needed. 04/14/13   Evans Lance, MD  glucose blood (ACCU-CHEK AVIVA PLUS) test strip Test three times daily. DX code 250.00/ E11.9 10/22/13   Mancel Bale, PA-C  insulin NPH-regular Human (HUMULIN 70/30) (70-30) 100 UNIT/ML injection Inject 65 units each morning and 50 units each evening. Dispense 30 day supply or more if allowed. This prescription should cover one year. 03/04/13   Leandrew Koyanagi, MD   Lancets [facility] ULTRASOFT) lancets Use as instructed test three times daily 10/20/13   Mancel Bale, PA-C  lisinopril (PRINIVIL,ZESTRIL) 20 MG tablet Take 1 tablet (20 mg total) by mouth 2 (two) times daily. 04/14/13   Evans Lance, MD  mirtazapine (REMERON) 30 MG tablet Take 1 tablet (30 mg total) by mouth at bedtime. 09/02/13   Leandrew Koyanagi, MD  omega-3 acid ethyl esters (LOVAZA) 1 G capsule Take 2 g by mouth 2 (two) times daily.    Historical Provider, MD  PRISTIQ 50 MG 24 hr tablet take 1 tablet by mouth once daily 12/02/13   Chelle S Jeffery, PA-C  rosuvastatin (CRESTOR) 10 MG tablet Take 1 tablet (10 mg total) by mouth daily. 04/14/13   Evans Lance, MD  triazolam (HALCION) 0.25 MG tablet Take 1 tablet (0.25 mg total) by mouth at bedtime as needed for sleep. 09/02/13   Leandrew Koyanagi, MD   At the last visit he was advised to begin Insulin changes to try for better control---65 am daily, then dinner will adjust based on sugar level--home blood sugar records suggests that he has been successful.  At last visit marked increase in insomnia and dysthymia was noted and med changes were made. He feels significantly better remeron helping as is halcion (instead of Klonopin). He still needs alprazolam for daily anxiety on  a when necessary basis.  Still smoking--not motiv to change  He has noticed a persistent problem with his right great toenail cutting into the lateral nail fold-he can no longer control this with trimming  Exam BP 134/81 mmHg  Pulse 73  Temp(Src) 98 F (36.7 C)  Resp 16  Ht $R'5\' 8"'CN$  (1.727 m)  Wt 194 lb (87.998 kg)  BMI 29.50 kg/m2  SpO2 95% HEENT clear Heart regular without murmur Lungs clear No peripheral edema Sensation intact over feet to filament testing Generalized mild onychomycosis Right lateral nail fold #1 impinged by toenail//works at risk for this on the left foot as well  Wt Readings from Last 3 Encounters:  12/09/13 194 lb (87.998 kg)   09/02/13 194 lb 3.2 oz (88.089 kg)  06/03/13 192 lb (87.091 kg)   Lab Results  Component Value Date   HGBA1C 7.4   !!!! 12/09/2013   Impression and plan: DM (diabetes mellitus), secondary uncontrolled -greatly improved hemoglobin A1c and this level would be fine for him to maintain--he will continue his current insulin plans  Dysthymic disorder  Insomnia Anxiety state  We will continue current medication changes and increase the Remeron to maximum dose 45 mg  Screening for prostate cancer - Plan: PSA  Onychomycosis with Ingrown right big toenail - Plan: Ambulatory referral to Podiatry-Dr Rolley Sims  Follow-up 3 months

## 2013-12-10 LAB — PSA: PSA: 0.87 ng/mL (ref <=4.00)

## 2013-12-15 ENCOUNTER — Encounter: Payer: Self-pay | Admitting: Internal Medicine

## 2013-12-24 ENCOUNTER — Encounter (HOSPITAL_COMMUNITY): Payer: Self-pay | Admitting: Internal Medicine

## 2014-01-01 ENCOUNTER — Ambulatory Visit (INDEPENDENT_AMBULATORY_CARE_PROVIDER_SITE_OTHER): Payer: 59 | Admitting: Podiatrist

## 2014-01-01 ENCOUNTER — Encounter: Payer: Self-pay | Admitting: Podiatrist

## 2014-01-01 VITALS — BP 137/96 | HR 73 | Resp 16

## 2014-01-01 DIAGNOSIS — L600 Ingrowing nail: Secondary | ICD-10-CM

## 2014-01-01 MED ORDER — AMOXICILLIN-POT CLAVULANATE 875-125 MG PO TABS: 1.0000 | ORAL_TABLET | Freq: Two times a day (BID) | ORAL | Status: DC

## 2014-01-01 NOTE — Patient Instructions (Addendum)

## 2014-01-01 NOTE — Progress Notes (Signed)
   Subjective:    Patient ID: Justin Ray, male    DOB: January 16, 1966, 48 y.o.   MRN: [id number]  HPI Comments: "I have an ingrown"  Patient c/o tender 1st toes bilateral, lateral borders, right over left, for about 1-2 months. The right is red and swollen. He tried to trim. PCP referred. He is a diabetic. Last A1C was 7.5. Previously it was 9.5.  Toe Pain       Review of Systems  HENT: Positive for tinnitus.   Respiratory: Positive for cough and shortness of breath.   Cardiovascular: Positive for chest pain and palpitations.  Psychiatric/Behavioral: The patient is nervous/anxious.   All other systems reviewed and are negative.      Objective:   Physical Exam Patient is awake, alert, and oriented x 3.  In no acute distress.  Vascular status is intact with palpable pedal pulses at 2/4 DP and PT bilateral and capillary refill time within normal limits. Neurological sensation is also intact bilaterally via Semmes Weinstein monofilament at 5/5 sites. Light touch, vibratory sensation, Achilles tendon reflex is intact. Dermatological exam reveals redness and swelling to the right great toe- nail  Is incurvated on the medial side. Left hallux is also incurvated but not inflammed.  Dystrophy is present on bilateral hallux nails as well-  No redness or drainage.  Otherwise skin color, turger and texture as normal. No open lesions present.  Musculature intact with dorsiflexion, plantarflexion, inversion, eversion.    Assessment & Plan:  Ingrown toenail right hallux lateral border  Plan: Treatment options and alternatives discussed.  Recommended permanent phenol matrixectomy and patient agreed.  Right hallux was prepped with alcohol and a 1 to 1 mix of 0.5% marcaine plain and 2% lidocaine plain was administered in a digital block fashion.  The toe was then prepped with betadine solution and exsanguinated.  The offending lateral nail border was then excised and matrix tissue exposed.  Phenol was  then applied to the matrix tissue followed by an alcohol wash.  Antibiotic ointment and a dry sterile dressing was applied.  The patient was dispensed instructions for aftercare.  Rx for augmentin was also dispensed.

## 2014-01-19 ENCOUNTER — Ambulatory Visit (INDEPENDENT_AMBULATORY_CARE_PROVIDER_SITE_OTHER): Payer: 59 | Admitting: *Deleted

## 2014-01-19 DIAGNOSIS — I429 Cardiomyopathy, unspecified: Secondary | ICD-10-CM

## 2014-01-19 DIAGNOSIS — I428 Other cardiomyopathies: Secondary | ICD-10-CM

## 2014-01-19 NOTE — Progress Notes (Signed)
Remote ICD transmission.   

## 2014-01-27 LAB — MDC_IDC_ENUM_SESS_TYPE_REMOTE
Battery Remaining Longevity: 144 mo
Battery Remaining Percentage: 100 %
Brady Statistic RV Percent Paced: 0 %
HighPow Impedance: 60 Ohm
Lead Channel Impedance Value: 491 Ohm
Lead Channel Setting Pacing Pulse Width: 0.4 ms
Lead Channel Setting Sensing Sensitivity: 0.6 mV
MDC IDC MSMT LEADCHNL RV PACING THRESHOLD AMPLITUDE: 1.3 V
MDC IDC MSMT LEADCHNL RV PACING THRESHOLD PULSEWIDTH: 0.4 ms
MDC IDC PG SERIAL: 126350
MDC IDC SESS DTM: 20160105134900
MDC IDC SET LEADCHNL RV PACING AMPLITUDE: 2.6 V
Zone Setting Detection Interval: 250 ms
Zone Setting Detection Interval: 300 ms
Zone Setting Detection Interval: 333 ms

## 2014-02-11 ENCOUNTER — Encounter: Payer: Self-pay | Admitting: Cardiology

## 2014-02-17 ENCOUNTER — Encounter: Payer: Self-pay | Admitting: Internal Medicine

## 2014-02-25 ENCOUNTER — Encounter: Payer: Self-pay | Admitting: Cardiology

## 2014-03-10 ENCOUNTER — Encounter: Payer: Self-pay | Admitting: Internal Medicine

## 2014-03-10 ENCOUNTER — Ambulatory Visit (INDEPENDENT_AMBULATORY_CARE_PROVIDER_SITE_OTHER): Payer: 59 | Admitting: Internal Medicine

## 2014-03-10 ENCOUNTER — Ambulatory Visit (INDEPENDENT_AMBULATORY_CARE_PROVIDER_SITE_OTHER): Payer: 59

## 2014-03-10 VITALS — BP 130/76 | HR 80 | Temp 97.9°F | Resp 16 | Ht 68.5 in | Wt 197.8 lb

## 2014-03-10 DIAGNOSIS — E1165 Type 2 diabetes mellitus with hyperglycemia: Secondary | ICD-10-CM

## 2014-03-10 DIAGNOSIS — M25572 Pain in left ankle and joints of left foot: Secondary | ICD-10-CM

## 2014-03-10 DIAGNOSIS — IMO0002 Reserved for concepts with insufficient information to code with codable children: Secondary | ICD-10-CM

## 2014-03-10 NOTE — Progress Notes (Signed)
Subjective:    Patient ID: Justin Ray, male    DOB: [birth year], 49 y.o.   MRN: [id number]  HPIf/u DM,Depr, Insom and has new prob foot pain  DM---stable tho needs new machine home BS due to insur chg  5lb wt gain  Admits diet improprieties at times  Depr-see incr remeron//feeling better and sleeping better  L foot pain all the time for 1 mo following rolling ankle foot 2 wk prior. No swelling. Hurts to wt bear.  Patient Active Problem List   Diagnosis Date Noted  . DM (diabetes mellitus), type 2, uncontrolled 01/26/2009    Priority: High  . CARDIOMYOPATHY 01/26/2009    Priority: High  . CHF 01/26/2009    Priority: High  . Dysthymic disorder 02/28/2011    Priority: Medium  . Automatic implantable cardioverter-defibrillator in situ 02/06/2011    Priority: Medium  . Chronic systolic heart failure 71/21/9758  . Anxiety state 09/05/2011  . COPD with asthma 02/28/2011  . Nicotine use disorder 02/28/2011  . Insomnia 02/28/2011   Prior to Admission medications   Medication Sig Start Date End Date Taking? Authorizing Provider  ALPRAZolam Duanne Moron) 1 MG tablet Take 1 tablet (1 mg total) by mouth 2 (two) times daily as needed. 12/09/13  Yes Leandrew Koyanagi, MD  aspirin 81 MG tablet Take 81 mg by mouth daily.    Yes Historical Provider, MD  BD INSULIN SYRINGE ULTRAFINE 31G X 15/64" 1 ML MISC  10/11/13  Yes Historical Provider, MD  carvedilol (COREG) 25 MG tablet Take 1 tablet (25 mg total) by mouth 2 (two) times daily with a meal. 04/14/13  Yes Evans Lance, MD  digoxin (LANOXIN) 0.125 MG tablet Take 1 tablet (125 mcg total) by mouth daily. 04/14/13  Yes Evans Lance, MD  furosemide (LASIX) 40 MG tablet Take 1 tablet (40 mg total) by mouth as needed. 04/14/13  Yes Evans Lance, MD  insulin NPH-regular Human (HUMULIN 70/30) (70-30) 100 UNIT/ML injection Inject 65 units each morning and 50 units each evening. Dispense 30 day supply or more if allowed. This prescription should  cover one year. 03/04/13  Yes Leandrew Koyanagi, MD  Lancets [facility] ULTRASOFT) lancets Use as instructed test three times daily 10/20/13  Yes Mancel Bale, PA-C  lisinopril (PRINIVIL,ZESTRIL) 20 MG tablet Take 1 tablet (20 mg total) by mouth 2 (two) times daily. 04/14/13  Yes Evans Lance, MD  mirtazapine (REMERON) 45 MG tablet Take 1 tablet (45 mg total) by mouth at bedtime. 12/09/13  Yes Leandrew Koyanagi, MD  omega-3 acid ethyl esters (LOVAZA) 1 G capsule Take 2 g by mouth 2 (two) times daily.   Yes Historical Provider, MD  PRISTIQ 50 MG 24 hr tablet take 1 tablet by mouth once daily 12/02/13  Yes Chelle S Jeffery, PA-C  rosuvastatin (CRESTOR) 10 MG tablet Take 1 tablet (10 mg total) by mouth daily. 04/14/13  Yes Evans Lance, MD  triazolam (HALCION) 0.25 MG tablet Take 1 tablet (0.25 mg total) by mouth at bedtime as needed for sleep. 12/09/13  Yes Leandrew Koyanagi, MD   See last visit with referral to podiatry-had to have partial nail avulsion and now this is recurred with pain  Review of Systems  Constitutional: Negative for activity change and unexpected weight change.  Eyes: Negative for visual disturbance.  Respiratory: Negative for shortness of breath.   Cardiovascular: Negative for chest pain, palpitations and leg swelling.  Gastrointestinal: Negative for abdominal distention.  Objective:   Physical Exam BP 130/76 mmHg  Pulse 80  Temp(Src) 97.9 F (36.6 C) (Oral)  Resp 16  Ht 5' 8.5" (1.74 m)  Wt 197 lb 12.8 oz (89.721 kg)  BMI 29.63 kg/m2  SpO2 94% Looks well HEENT clear Heart regular No edema Left foot with tenderness along the proximal fifth metatarsal and pain with ankle inversion but no instability Ankle mortise intact   [facility] reading (PRIMARY) by  Dr.Zelena Bushong=no acute changes at proximal 5th MT      Assessment & Plan:  DM (diabetes mellitus), type 2, uncontrolled - Plan: Glucose (Device for Home Use)  Pain, joint, foot, left - Plan: DG Foot 2  Views Left   Home glucose monitoring equipment No change in medications Labs can be done at follow-up visit in 3-4 months Start treatment with ice and range of motion and if not improved in 3 weeks will consult podiatry about the need for orthotics//will need podiatry revisit for nail problem anyway  Follow-up appointment 3-4 months

## 2014-03-31 ENCOUNTER — Other Ambulatory Visit: Payer: Self-pay | Admitting: Physician Assistant

## 2014-03-31 ENCOUNTER — Other Ambulatory Visit: Payer: Self-pay | Admitting: Internal Medicine

## 2014-04-03 ENCOUNTER — Other Ambulatory Visit: Payer: Self-pay | Admitting: Internal Medicine

## 2014-04-05 ENCOUNTER — Other Ambulatory Visit: Payer: Self-pay

## 2014-04-05 MED ORDER — GLUCOSE BLOOD VI STRP: ORAL_STRIP | Status: DC

## 2014-04-05 MED ORDER — LANCETS MISC: Status: DC

## 2014-04-05 MED ORDER — BLOOD GLUCOSE MONITOR KIT: PACK | Status: AC

## 2014-04-28 ENCOUNTER — Other Ambulatory Visit: Payer: Self-pay | Admitting: Internal Medicine

## 2014-05-07 ENCOUNTER — Encounter: Payer: Self-pay | Admitting: Internal Medicine

## 2014-05-07 ENCOUNTER — Ambulatory Visit (INDEPENDENT_AMBULATORY_CARE_PROVIDER_SITE_OTHER): Payer: 59 | Admitting: Internal Medicine

## 2014-05-07 VITALS — BP 122/90 | HR 76 | Ht 68.0 in | Wt 197.2 lb

## 2014-05-07 DIAGNOSIS — Z95810 Presence of automatic (implantable) cardiac defibrillator: Secondary | ICD-10-CM

## 2014-05-07 DIAGNOSIS — F411 Generalized anxiety disorder: Secondary | ICD-10-CM | POA: Diagnosis not present

## 2014-05-07 DIAGNOSIS — I5022 Chronic systolic (congestive) heart failure: Secondary | ICD-10-CM

## 2014-05-07 LAB — MDC_IDC_ENUM_SESS_TYPE_INCLINIC
Date Time Interrogation Session: 20160422040000
HIGH POWER IMPEDANCE MEASURED VALUE: 60 Ohm
HighPow Impedance: 50 Ohm
Lead Channel Impedance Value: 511 Ohm
Lead Channel Pacing Threshold Amplitude: 1.4 V
Lead Channel Sensing Intrinsic Amplitude: 10 mV
Lead Channel Setting Pacing Amplitude: 2.6 V
Lead Channel Setting Sensing Sensitivity: 0.6 mV
MDC IDC MSMT LEADCHNL RV PACING THRESHOLD PULSEWIDTH: 0.4 ms
MDC IDC PG SERIAL: 126350
MDC IDC SET LEADCHNL RV PACING PULSEWIDTH: 0.4 ms
MDC IDC SET ZONE DETECTION INTERVAL: 250 ms
MDC IDC SET ZONE DETECTION INTERVAL: 300 ms
Zone Setting Detection Interval: 333 ms

## 2014-05-07 NOTE — Assessment & Plan Note (Signed)
His [place] [employer] ICD is working normally. Will recheck in several months. 

## 2014-05-07 NOTE — Progress Notes (Signed)
HPI Justin Ray returns today for followup. He is a very pleasant 49 year old man with chronic systolic heart failure, class II, a nonischemic cardiomyopathy, status post ICD implantation. In the interim, he has done well. He continues to work full-time and denies chest pain or shortness of breath. He has not had palpitations. No syncope. No edema. He has recently undergone skin cancer surgery. Allergies  Allergen Reactions  . Codeine Other (See Comments)    Severe HA's.  . Niaspan [Niacin Er] Other (See Comments)    Per pt feels like skin is burning( REDNESS), and pins/needles are sticking   . Sulfonamide Derivatives Other (See Comments)    thrush     Current Outpatient Prescriptions  Medication Sig Dispense Refill  . ALPRAZolam (XANAX) 1 MG tablet Take 1 mg by mouth 2 (two) times daily as needed for anxiety (for anxiety).    Marland Kitchen aspirin 81 MG tablet Take 81 mg by mouth daily.     . blood glucose meter kit and supplies KIT Test blood sugar 3 times daily. Dx code: E11.65 1 each 0  . carvedilol (COREG) 25 MG tablet take 1 tablet by mouth twice a day WITH A MEAL 180 tablet 0  . DIGOX 125 MCG tablet take 1 tablet by mouth once daily 90 tablet 0  . furosemide (LASIX) 40 MG tablet Take 1 tablet (40 mg total) by mouth as needed. 30 tablet 6  . gentamicin cream (GARAMYCIN) 0.1 % Apply 1 application topically 3 (three) times daily. Apply to LEFT ear three times daily as directed  0  . glucose blood test strip Test blood sugar 3 times daily. Dx code: E11.65 300 each 3  . insulin NPH-regular Human (HUMULIN 70/30) (70-30) 100 UNIT/ML injection INJECT 65 UNITS EVERY MORNING AND 50 UNITS EVERY EVENING 40 mL 3  . Insulin Syringe-Needle U-100 (BD INSULIN SYRINGE ULTRAFINE) 31G X 15/64" 1 ML MISC USE AS DIRECTED TO INJECT INSULIN TWICE A DAY. 100 each 3  . Lancets (ONETOUCH ULTRASOFT) lancets Use as instructed test three times daily 100 each 12  . Lancets MISC Test blood sugar 3 times daily. Dx code:  E11.65 300 each 3  . lisinopril (PRINIVIL,ZESTRIL) 20 MG tablet take 1 tablet by mouth twice a day 180 tablet 0  . mirtazapine (REMERON) 45 MG tablet Take 1 tablet (45 mg total) by mouth at bedtime. 90 tablet 3  . omega-3 acid ethyl esters (LOVAZA) 1 G capsule Take 2 g by mouth 2 (two) times daily.    Marland Kitchen oxyCODONE-acetaminophen (PERCOCET/ROXICET) 5-325 MG per tablet Take 1 tablet by mouth every 6 (six) hours as needed. Take 1 tablet by mouth every 6-8 hours as needed for pain  0  . PRISTIQ 50 MG 24 hr tablet take 1 tablet by mouth once daily 30 tablet 2  . rosuvastatin (CRESTOR) 10 MG tablet Take 10 mg by mouth daily.    . triazolam (HALCION) 0.25 MG tablet Take 1 tablet (0.25 mg total) by mouth at bedtime as needed for sleep. 30 tablet 2   No current facility-administered medications for this visit.     Past Medical History  Diagnosis Date  . CHF (congestive heart failure)   . Cardiomyopathy   . Dyslipidemia   . Diabetes mellitus   . Anxiety   . Hypertension   . Cataract   . Wears glasses   . Sleep apnea     was tested many yr ago-does not use a cpap-  . ICD (implantable cardioverter-defibrillator) in place   .  Carpal tunnel syndrome 10/14    right-GSC    ROS:   All systems reviewed and negative except as noted in the HPI.   Past Surgical History  Procedure Laterality Date  . Cardiac defibrillator placement  02/01/2004  . Tonsillectomy    . Eye surgery    . Cardiac defibrillator placement  11/14    replaced with new one  . Carpal tunnel release Left 12/19/2012    Procedure: LEFT CARPAL TUNNEL RELEASE;  Surgeon: Roseanne Kaufman, MD;  Location: [place];  Service: Orthopedics;  Laterality: Left;  . Implantable cardioverter defibrillator (icd) generator change N/A 11/21/2012    Procedure: ICD GENERATOR CHANGE;  Surgeon: Evans Lance, MD;  [facility] C[facility] LAB;  Service: Cardiovascular;  Laterality: N/A;     Family History  Problem Relation Age of Onset   . Cancer    . Coronary artery disease Father     CABG  . Cancer Mother     colon  . Heart disease Brother      History   Social History  . Marital Status: Widowed    Spouse Name: N/A  . Number of Children: 0  . Years of Education: N/A   Occupational History  . maintenance Unemployed   Social History Main Topics  . Smoking status: Current Every Day Smoker -- 1.50 packs/day    Types: Cigarettes  . Smokeless tobacco: Not on file  . Alcohol Use: No  . Drug Use: No  . Sexual Activity: No     Comment: number of sex partners in the last 12 months  0   Other Topics Concern  . Not on file   Social History Narrative     BP 122/90 mmHg  Pulse 76  Ht $R'5\' 8"'wK$  (1.727 m)  Wt 197 lb 3.2 oz (89.449 kg)  BMI 29.99 kg/m2  Physical Exam:  Well appearing 49 year old man, NAD HEENT: Unremarkable Neck:  6 cm JVD, no thyromegally Lungs:  Clear with no wheezes, rales, or rhonchi. HEART:  Regular rate rhythm, no murmurs, no rubs, no clicks Abd:  soft, positive bowel sounds, no organomegally, no rebound, no guarding Ext:  2 plus pulses, no edema, no cyanosis, no clubbing Skin:  No rashes no nodules Neuro:  CN II through XII intact, motor grossly intact   DEVICE  Normal device function.  See PaceArt for details.   Assess/Plan:

## 2014-05-07 NOTE — Assessment & Plan Note (Signed)
He is better. No change in meds.

## 2014-05-07 NOTE — Patient Instructions (Signed)
Medication Instructions:  Your physician recommends that you continue on your current medications as directed. Please refer to the Current Medication list given to you today.   Labwork: None ordered  Testing/Procedures: None ordered  Follow-Up: Your physician wants you to follow-up in: 12 months with Dr Knox Saliva will receive a reminder letter in the mail two months in advance. If you don't receive a letter, please call our office to schedule the follow-up appointment.  Remote monitoring is used to monitor your Pacemaker or ICD from home. This monitoring reduces the number of office visits required to check your device to one time per year. It allows [place] to keep an eye on the functioning of your device to ensure it is working properly. You are scheduled for a device check from home on 08/09/14. You may send your transmission at any time that day. If you have a wireless device, the transmission will be sent automatically. After your physician reviews your transmission, you will receive a postcard with your next transmission date.    Any Other Special Instructions Will Be Listed Below (If Applicable).

## 2014-05-07 NOTE — Assessment & Plan Note (Signed)
His symptoms are well controlled, class 2A. I have asked him to continue his current medications and maintain a low sodium diet.

## 2014-06-23 ENCOUNTER — Ambulatory Visit (INDEPENDENT_AMBULATORY_CARE_PROVIDER_SITE_OTHER): Payer: 59 | Admitting: Internal Medicine

## 2014-06-23 ENCOUNTER — Encounter: Payer: Self-pay | Admitting: Internal Medicine

## 2014-06-23 VITALS — BP 145/80 | HR 77 | Temp 98.0°F | Resp 16 | Ht 69.0 in | Wt 197.0 lb

## 2014-06-23 DIAGNOSIS — IMO0002 Reserved for concepts with insufficient information to code with codable children: Secondary | ICD-10-CM

## 2014-06-23 DIAGNOSIS — E1165 Type 2 diabetes mellitus with hyperglycemia: Secondary | ICD-10-CM

## 2014-06-23 LAB — POCT GLYCOSYLATED HEMOGLOBIN (HGB A1C): Hemoglobin A1C: 8.6

## 2014-06-23 MED ORDER — ALPRAZOLAM 1 MG PO TABS
1.0000 mg | ORAL_TABLET | Freq: Two times a day (BID) | ORAL | Status: DC | PRN
Start: 2014-06-23 — End: 2015-05-04

## 2014-06-23 MED ORDER — "INSULIN SYRINGE-NEEDLE U-100 31G X 15/64"" 1 ML MISC": Status: DC
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
## 2014-06-23 NOTE — Patient Instructions (Signed)
Dr Mikki Santee [place]--[address] [phone]

## 2014-06-24 ENCOUNTER — Encounter: Payer: Self-pay | Admitting: Internal Medicine

## 2014-06-24 LAB — CBC WITH DIFFERENTIAL/PLATELET
Basophils Absolute: 0 10*3/uL (ref 0.0–0.1)
Basophils Relative: 0 % (ref 0–1)
EOS ABS: 0.1 10*3/uL (ref 0.0–0.7)
Eosinophils Relative: 2 % (ref 0–5)
HEMATOCRIT: 50.3 % (ref 39.0–52.0)
Hemoglobin: 17.5 g/dL — ABNORMAL HIGH (ref 13.0–17.0)
Lymphocytes Relative: 26 % (ref 12–46)
Lymphs Abs: 1.7 10*3/uL (ref 0.7–4.0)
MCH: 31.3 pg (ref 26.0–34.0)
MCHC: 34.8 g/dL (ref 30.0–36.0)
MCV: 89.8 fL (ref 78.0–100.0)
MONO ABS: 0.6 10*3/uL (ref 0.1–1.0)
MPV: 9.6 fL (ref 8.6–12.4)
Monocytes Relative: 10 % (ref 3–12)
NEUTROS PCT: 62 % (ref 43–77)
Neutro Abs: 4 10*3/uL (ref 1.7–7.7)
Platelets: 162 10*3/uL (ref 150–400)
RBC: 5.6 MIL/uL (ref 4.22–5.81)
RDW: 13.5 % (ref 11.5–15.5)
WBC: 6.4 10*3/uL (ref 4.0–10.5)

## 2014-06-24 LAB — COMPREHENSIVE METABOLIC PANEL
ALBUMIN: 4.1 g/dL (ref 3.5–5.2)
ALT: 53 U/L (ref 0–53)
AST: 31 U/L (ref 0–37)
Alkaline Phosphatase: 58 U/L (ref 39–117)
BILIRUBIN TOTAL: 0.5 mg/dL (ref 0.2–1.2)
BUN: 14 mg/dL (ref 6–23)
CO2: 22 meq/L (ref 19–32)
Calcium: 8.9 mg/dL (ref 8.4–10.5)
Chloride: 109 mEq/L (ref 96–112)
Creat: 0.81 mg/dL (ref 0.50–1.35)
Glucose, Bld: 137 mg/dL — ABNORMAL HIGH (ref 70–99)
Potassium: 4.1 mEq/L (ref 3.5–5.3)
SODIUM: 141 meq/L (ref 135–145)
TOTAL PROTEIN: 6.6 g/dL (ref 6.0–8.3)

## 2014-06-24 LAB — LIPID PANEL
Cholesterol: 79 mg/dL (ref 0–200)
HDL: 27 mg/dL — AB (ref >=40)
LDL CALC: 6 mg/dL (ref 0–99)
Total CHOL/HDL Ratio: 2.9 Ratio
Triglycerides: 232 mg/dL — ABNORMAL HIGH (ref <150)
VLDL: 46 mg/dL — ABNORMAL HIGH (ref 0–40)

## 2014-06-24 LAB — MICROALBUMIN, URINE: Microalb, Ur: 28.5 mg/dL — ABNORMAL HIGH (ref <2.0)

## 2014-06-25 ENCOUNTER — Other Ambulatory Visit: Payer: Self-pay | Admitting: Internal Medicine

## 2014-07-22 ENCOUNTER — Other Ambulatory Visit: Payer: Self-pay | Admitting: Internal Medicine

## 2014-07-25 ENCOUNTER — Other Ambulatory Visit: Payer: Self-pay | Admitting: Internal Medicine

## 2014-07-26 ENCOUNTER — Other Ambulatory Visit: Payer: Self-pay

## 2014-07-26 MED ORDER — CARVEDILOL 25 MG PO TABS: ORAL_TABLET | ORAL | Status: DC

## 2014-07-26 MED ORDER — DIGOXIN 125 MCG PO TABS: 125.0000 ug | ORAL_TABLET | Freq: Every day | ORAL | Status: DC

## 2014-08-06 ENCOUNTER — Ambulatory Visit (INDEPENDENT_AMBULATORY_CARE_PROVIDER_SITE_OTHER): Payer: 59

## 2014-08-06 DIAGNOSIS — Z95810 Presence of automatic (implantable) cardiac defibrillator: Secondary | ICD-10-CM | POA: Diagnosis not present

## 2014-08-06 DIAGNOSIS — I5022 Chronic systolic (congestive) heart failure: Secondary | ICD-10-CM | POA: Diagnosis not present

## 2014-08-09 LAB — CUP PACEART REMOTE DEVICE CHECK
Battery Remaining Percentage: 100 %
Brady Statistic RV Percent Paced: 0 %
Date Time Interrogation Session: 20160725122100
HIGH POWER IMPEDANCE MEASURED VALUE: 61 Ohm
Lead Channel Impedance Value: 481 Ohm
Lead Channel Setting Pacing Pulse Width: 0.4 ms
Lead Channel Setting Sensing Sensitivity: 0.6 mV
MDC IDC MSMT BATTERY REMAINING LONGEVITY: 132 mo
MDC IDC MSMT LEADCHNL RV SENSING INTR AMPL: 9 mV
MDC IDC SET LEADCHNL RV PACING AMPLITUDE: 2.6 V
Pulse Gen Serial Number: 126350
Zone Setting Detection Interval: 250 ms
Zone Setting Detection Interval: 300 ms
Zone Setting Detection Interval: 333 ms

## 2014-08-20 ENCOUNTER — Encounter: Payer: Self-pay | Admitting: Cardiology

## 2014-08-27 ENCOUNTER — Encounter: Payer: Self-pay | Admitting: Internal Medicine

## 2014-09-07 ENCOUNTER — Encounter: Payer: Self-pay | Admitting: Cardiology

## 2014-09-29 ENCOUNTER — Ambulatory Visit (INDEPENDENT_AMBULATORY_CARE_PROVIDER_SITE_OTHER): Payer: 59 | Admitting: Internal Medicine

## 2014-09-29 VITALS — BP 137/78 | HR 74 | Temp 98.3°F | Resp 16 | Ht 68.0 in | Wt 200.0 lb

## 2014-09-29 DIAGNOSIS — I5042 Chronic combined systolic (congestive) and diastolic (congestive) heart failure: Secondary | ICD-10-CM

## 2014-09-29 DIAGNOSIS — Z1211 Encounter for screening for malignant neoplasm of colon: Secondary | ICD-10-CM

## 2014-09-29 DIAGNOSIS — G4700 Insomnia, unspecified: Secondary | ICD-10-CM | POA: Diagnosis not present

## 2014-09-29 DIAGNOSIS — Z1159 Encounter for screening for other viral diseases: Secondary | ICD-10-CM | POA: Diagnosis not present

## 2014-09-29 DIAGNOSIS — F17200 Nicotine dependence, unspecified, uncomplicated: Secondary | ICD-10-CM | POA: Diagnosis not present

## 2014-09-29 DIAGNOSIS — Z23 Encounter for immunization: Secondary | ICD-10-CM | POA: Diagnosis not present

## 2014-09-29 DIAGNOSIS — E785 Hyperlipidemia, unspecified: Secondary | ICD-10-CM | POA: Diagnosis not present

## 2014-09-29 DIAGNOSIS — E1165 Type 2 diabetes mellitus with hyperglycemia: Secondary | ICD-10-CM

## 2014-09-29 DIAGNOSIS — Z114 Encounter for screening for human immunodeficiency virus [HIV]: Secondary | ICD-10-CM | POA: Diagnosis not present

## 2014-09-29 DIAGNOSIS — I429 Cardiomyopathy, unspecified: Secondary | ICD-10-CM | POA: Diagnosis not present

## 2014-09-29 DIAGNOSIS — F341 Dysthymic disorder: Secondary | ICD-10-CM

## 2014-09-29 DIAGNOSIS — IMO0002 Reserved for concepts with insufficient information to code with codable children: Secondary | ICD-10-CM

## 2014-09-29 LAB — LIPID PANEL
CHOL/HDL RATIO: 3.2 ratio (ref <=5.0)
CHOLESTEROL: 87 mg/dL — AB (ref 125–200)
HDL: 27 mg/dL — AB (ref >=40)
LDL Cholesterol: 10 mg/dL (ref <130)
Triglycerides: 249 mg/dL — ABNORMAL HIGH (ref <150)
VLDL: 50 mg/dL — AB (ref <30)

## 2014-09-29 LAB — POCT GLYCOSYLATED HEMOGLOBIN (HGB A1C): Hemoglobin A1C: 7.6

## 2014-09-29 NOTE — Progress Notes (Signed)
Subjective:    Patient ID: Justin Ray, male    DOB: 25-Apr-1965, 49 y.o.   MRN: [id number]  HPIsee last OV Diabetes: -is adj insulin but no set time to eat at work making it tough-can't have ins at [employer] bad foods at time -put therapy aside til holidays   Congestive heart failure secondary to cardiomyopathy: See echo--left ventricular ejection fraction has approached normal!!!! He has had no peripheral edema for most of the last year His energy level has increased  Mom died colon Ca age 5--he had 1st colo 87yr[age] Time to repeat-asymptomatic          Patient Active Problem List   Diagnosis Date Noted  . DM (diabetes mellitus), type 2, uncontrolled 01/26/2009    Priority: High  . CARDIOMYOPATHY 01/26/2009    Priority: High  . CHF 01/26/2009    Priority: High  . Dysthymic disorder 02/28/2011    Priority: Medium  . Automatic implantable cardioverter-defibrillator in situ 02/06/2011    Priority: Medium  . Chronic systolic heart failure 025/00/3704 . Anxiety state 09/05/2011  . COPD with asthma 02/28/2011  . Nicotine use disorder 02/28/2011  . Insomnia 02/28/2011   Current outpatient prescriptions:  .  ALPRAZolam (XANAX) 1 MG tablet, Take 1 tablet (1 mg total) by mouth 2 (two) times daily as needed for anxiety (for anxiety)., Disp: 60 tablet, Rfl: 5 .  aspirin 81 MG tablet, Take 81 mg by mouth daily. , Disp: , Rfl:  .  blood glucose meter kit and supplies KIT, Test blood sugar 3 times daily. Dx code: E11.65, Disp: 1 each, Rfl: 0 .  carvedilol (COREG) 25 MG tablet, take 1 tablet by mouth twice a day WITH A MEAL, Disp: 180 tablet, Rfl: 1 .  CRESTOR 10 MG tablet, take 1 tablet by mouth once daily, Disp: 90 tablet, Rfl: 2 .  digoxin (DIGOX) 0.125 MG tablet, Take 1 tablet (125 mcg total) by mouth daily., Disp: 90 tablet, Rfl: 1 .  furosemide (LASIX) 40 MG tablet, Take 1 tablet (40 mg total) by mouth as needed., Disp: 30 tablet, Rfl: 6 --hasn't needed the last  year .  gentamicin cream (GARAMYCIN) 0.1 %, Apply 1 application topically 3 (three) times daily. Apply to LEFT ear three times daily as directed, Disp: , Rfl: 0 .  glucose blood test strip, Test blood sugar 3 times daily. Dx code: E11.65, Disp: 300 each, Rfl: 3 .  insulin NPH-regular Human (HUMULIN 70/30) (70-30) 100 UNIT/ML injection, INJECT 65 UNITS EVERY MORNING AND 50 UNITS EVERY EVENING, Disp: 40 mL, Rfl: 3 .  Insulin Syringe-Needle U-100 (BD INSULIN SYRINGE ULTRAFINE) 31G X 15/64" 1 ML MISC, USE AS DIRECTED TO INJECT INSULIN TWICE A DAY., Disp: 100 each, Rfl: 6 .  Lancets MISC, Test blood sugar 3 times daily. Dx code: E11.65, Disp: 300 each, Rfl: 3 .  lisinopril (PRINIVIL,ZESTRIL) 20 MG tablet, take 1 tablet by mouth twice a day, Disp: 180 tablet, Rfl: 2 .  mirtazapine (REMERON) 45 MG tablet, Take 1 tablet (45 mg total) by mouth at bedtime., Disp: 90 tablet, Rfl: 3 .  omega-3 acid ethyl esters (LOVAZA) 1 G capsule, Take 2 g by mouth 2 (two) times daily., Disp: , Rfl:  .  PRISTIQ 50 MG 24 hr tablet, take 1 tablet by mouth once daily, Disp: 30 tablet, Rfl: 5 .  triazolam (HALCION) 0.25 MG tablet, Take 1 tablet (0.25 mg total) by mouth at bedtime as needed for sleep., Disp: 30 tablet, Rfl:  2  He still has very hard to control depression and anxiety related to the loss of his significant other. He postpone therapy thinking he would get worse around the holiday season and would start therapy then   Review of Systems  Constitutional: Negative for appetite change, fatigue and unexpected weight change.  HENT: Negative for trouble swallowing.   Eyes: Negative for visual disturbance.  Respiratory: Negative for shortness of breath.   Cardiovascular: Negative for chest pain, palpitations and leg swelling.       Defibrillator implanted  Gastrointestinal: Negative for abdominal pain.  Neurological: Negative for headaches.  Hematological: Negative for adenopathy. Does not bruise/bleed easily.    Psychiatric/Behavioral: Negative for suicidal ideas and decreased concentration.       Objective:   Physical Exam BP 137/78 mmHg  Pulse 74  Temp(Src) 98.3 F (36.8 C)  Resp 16  Ht 5' 8" (1.727 m)  Wt 200 lb (90.719 kg)  BMI 30.42 kg/m2 HEENT clear Heart regular without murmur No thyromegaly Chest clear to auscultation except very mild prolonged expiratory phase with forced expiration and slight wheezes Extremities good peripheral pulses and no edema No sensory losses on the plantar aspects Cranial nerves II through XII intact Mood good affect appropriate thought content normal judgment sound    POCT glycosylated hemoglobin (Hb A1C)  Result Value Ref Range   Hemoglobin A1C 7.6   !!!!         Assessment & Plan:  Need for prophylactic vaccination and inoculation against influenza - Plan: Flu Vaccine QUAD 36+ mos IM  DM (diabetes mellitus), type 2, uncontrolled - Plan: Continue to adjust insulin based on blood sugars and try to improve diet  Special screening for malignant neoplasms, colon - Plan: Ambulatory referral to Gastroenterology--prefers someone near his home  Need for hepatitis C screening test - Plan: Hepatitis C antibody  Screening for HIV (human immunodeficiency virus) - Plan: HIV antibody  Hyperlipidemia - Plan: Lipid panel pending  Secondary cardiomyopathy--- he has had a miracle improvement of his left ventricular ejection fraction  Chronic combined systolic and diastolic congestive heart failure  Improved  Dysthymic disorder--no change in meds/therapy soon  Nicotine use disorder  Has been unable to quit  Insomnia  Related to depression anxiety  Dyslipidemia-primarily triglycerides  He still has room for improvement with diet  He does not need medication refills and will have the pharmacy call

## 2014-09-30 LAB — HEPATITIS C ANTIBODY: HCV AB: NEGATIVE

## 2014-09-30 LAB — HIV ANTIBODY (ROUTINE TESTING W REFLEX): HIV 1&2 Ab, 4th Generation: NONREACTIVE

## 2014-10-24 ENCOUNTER — Other Ambulatory Visit: Payer: Self-pay | Admitting: Physician Assistant

## 2014-11-08 ENCOUNTER — Telehealth: Payer: Self-pay | Admitting: Cardiology

## 2014-11-08 ENCOUNTER — Ambulatory Visit (INDEPENDENT_AMBULATORY_CARE_PROVIDER_SITE_OTHER): Payer: 59 | Admitting: *Deleted

## 2014-11-08 DIAGNOSIS — I429 Cardiomyopathy, unspecified: Secondary | ICD-10-CM

## 2014-11-08 DIAGNOSIS — I428 Other cardiomyopathies: Secondary | ICD-10-CM

## 2014-11-08 LAB — HM COLONOSCOPY

## 2014-11-08 NOTE — Telephone Encounter (Signed)
LMOVM reminding pt to send remote transmission.   

## 2014-11-09 NOTE — Progress Notes (Signed)
LOOP RECORDER  

## 2014-11-10 NOTE — Progress Notes (Signed)
Error below --- Remote ICD transmission.   

## 2014-11-12 ENCOUNTER — Encounter: Payer: Self-pay | Admitting: Cardiology

## 2014-11-12 LAB — CUP PACEART REMOTE DEVICE CHECK
Battery Remaining Longevity: 132 mo
Brady Statistic RV Percent Paced: 0 %
Date Time Interrogation Session: 20161024205100
HIGH POWER IMPEDANCE MEASURED VALUE: 70 Ohm
Implantable Lead Implant Date: 20060117
Implantable Lead Location: 753860
Implantable Lead Model: 185
Implantable Lead Serial Number: 124785
Lead Channel Pacing Threshold Amplitude: 1.4 V
Lead Channel Pacing Threshold Pulse Width: 0.4 ms
MDC IDC MSMT BATTERY REMAINING PERCENTAGE: 100 %
MDC IDC MSMT LEADCHNL RV IMPEDANCE VALUE: 551 Ohm
MDC IDC PG SERIAL: 126350
MDC IDC SET LEADCHNL RV PACING AMPLITUDE: 2.6 V
MDC IDC SET LEADCHNL RV PACING PULSEWIDTH: 0.4 ms
MDC IDC SET LEADCHNL RV SENSING SENSITIVITY: 0.6 mV

## 2014-11-22 ENCOUNTER — Encounter: Payer: Self-pay | Admitting: Family Medicine

## 2014-12-08 ENCOUNTER — Other Ambulatory Visit: Payer: Self-pay | Admitting: Internal Medicine

## 2014-12-13 ENCOUNTER — Encounter: Payer: Self-pay | Admitting: Internal Medicine

## 2015-01-05 ENCOUNTER — Other Ambulatory Visit: Payer: Self-pay | Admitting: Internal Medicine

## 2015-02-02 ENCOUNTER — Ambulatory Visit: Payer: 59 | Admitting: Internal Medicine

## 2015-02-07 ENCOUNTER — Telehealth: Payer: Self-pay | Admitting: *Deleted

## 2015-02-07 ENCOUNTER — Ambulatory Visit (INDEPENDENT_AMBULATORY_CARE_PROVIDER_SITE_OTHER): Payer: 59 | Admitting: *Deleted

## 2015-02-07 DIAGNOSIS — I429 Cardiomyopathy, unspecified: Secondary | ICD-10-CM | POA: Diagnosis not present

## 2015-02-07 DIAGNOSIS — I428 Other cardiomyopathies: Secondary | ICD-10-CM

## 2015-02-07 NOTE — Telephone Encounter (Signed)
[facility] CALLING ABOUT REFILL ON PRISTIQ.

## 2015-02-08 MED ORDER — DESVENLAFAXINE SUCCINATE ER 50 MG PO TB24: ORAL_TABLET | ORAL | Status: DC

## 2015-02-08 NOTE — Progress Notes (Signed)
Remote ICD transmission.   

## 2015-02-08 NOTE — Telephone Encounter (Signed)
Rx sent 

## 2015-02-11 ENCOUNTER — Encounter: Payer: Self-pay | Admitting: Internal Medicine

## 2015-02-11 ENCOUNTER — Other Ambulatory Visit: Payer: Self-pay | Admitting: Internal Medicine

## 2015-02-11 ENCOUNTER — Ambulatory Visit (INDEPENDENT_AMBULATORY_CARE_PROVIDER_SITE_OTHER): Payer: 59 | Admitting: Internal Medicine

## 2015-02-11 VITALS — BP 135/80 | HR 76 | Temp 98.7°F | Resp 16 | Ht 68.0 in | Wt 198.0 lb

## 2015-02-11 DIAGNOSIS — E1165 Type 2 diabetes mellitus with hyperglycemia: Secondary | ICD-10-CM

## 2015-02-11 DIAGNOSIS — Z125 Encounter for screening for malignant neoplasm of prostate: Secondary | ICD-10-CM | POA: Diagnosis not present

## 2015-02-11 DIAGNOSIS — F341 Dysthymic disorder: Secondary | ICD-10-CM

## 2015-02-11 DIAGNOSIS — E785 Hyperlipidemia, unspecified: Secondary | ICD-10-CM

## 2015-02-11 DIAGNOSIS — F17218 Nicotine dependence, cigarettes, with other nicotine-induced disorders: Secondary | ICD-10-CM

## 2015-02-11 DIAGNOSIS — Z794 Long term (current) use of insulin: Secondary | ICD-10-CM

## 2015-02-11 LAB — CUP PACEART REMOTE DEVICE CHECK
HighPow Impedance: 64 Ohm
Implantable Lead Model: 185
Lead Channel Impedance Value: 550 Ohm
Lead Channel Sensing Intrinsic Amplitude: 11.5 mV
Lead Channel Setting Pacing Amplitude: 2.6 V
Lead Channel Setting Pacing Pulse Width: 0.4 ms
Lead Channel Setting Sensing Sensitivity: 0.6 mV
MDC IDC LEAD IMPLANT DT: 20060117
MDC IDC LEAD LOCATION: 753860
MDC IDC LEAD SERIAL: 124785
MDC IDC MSMT BATTERY REMAINING LONGEVITY: 126 mo
MDC IDC MSMT BATTERY REMAINING PERCENTAGE: 100 %
MDC IDC SESS DTM: 20170123162000
MDC IDC STAT BRADY RV PERCENT PACED: 0 %
Pulse Gen Serial Number: 126350

## 2015-02-11 LAB — COMPREHENSIVE METABOLIC PANEL
ALBUMIN: 4.1 g/dL (ref 3.6–5.1)
ALT: 51 U/L — ABNORMAL HIGH (ref 9–46)
AST: 31 U/L (ref 10–35)
Alkaline Phosphatase: 68 U/L (ref 40–115)
BUN: 15 mg/dL (ref 7–25)
CALCIUM: 8.7 mg/dL (ref 8.6–10.3)
CHLORIDE: 105 mmol/L (ref 98–110)
CO2: 25 mmol/L (ref 20–31)
Creat: 0.84 mg/dL (ref 0.70–1.33)
GLUCOSE: 163 mg/dL — AB (ref 65–99)
Potassium: 4.3 mmol/L (ref 3.5–5.3)
SODIUM: 140 mmol/L (ref 135–146)
Total Bilirubin: 0.6 mg/dL (ref 0.2–1.2)
Total Protein: 6.6 g/dL (ref 6.1–8.1)

## 2015-02-11 LAB — CBC WITH DIFFERENTIAL/PLATELET
BASOS PCT: 0 % (ref 0–1)
Basophils Absolute: 0 10*3/uL (ref 0.0–0.1)
EOS PCT: 2 % (ref 0–5)
Eosinophils Absolute: 0.1 10*3/uL (ref 0.0–0.7)
HEMATOCRIT: 48.9 % (ref 39.0–52.0)
HEMOGLOBIN: 17.2 g/dL — AB (ref 13.0–17.0)
Lymphocytes Relative: 24 % (ref 12–46)
Lymphs Abs: 1.6 10*3/uL (ref 0.7–4.0)
MCH: 31.9 pg (ref 26.0–34.0)
MCHC: 35.2 g/dL (ref 30.0–36.0)
MCV: 90.7 fL (ref 78.0–100.0)
MONO ABS: 0.5 10*3/uL (ref 0.1–1.0)
MONOS PCT: 8 % (ref 3–12)
MPV: 9.7 fL (ref 8.6–12.4)
NEUTROS ABS: 4.4 10*3/uL (ref 1.7–7.7)
Neutrophils Relative %: 66 % (ref 43–77)
Platelets: 138 10*3/uL — ABNORMAL LOW (ref 150–400)
RBC: 5.39 MIL/uL (ref 4.22–5.81)
RDW: 13.5 % (ref 11.5–15.5)
WBC: 6.6 10*3/uL (ref 4.0–10.5)

## 2015-02-11 LAB — POCT GLYCOSYLATED HEMOGLOBIN (HGB A1C): HEMOGLOBIN A1C: 8.8

## 2015-02-11 NOTE — Progress Notes (Signed)
Subjective:    Patient ID: Justin Ray, male    DOB: 01-09-1966, 50 y.o.   MRN: [id number] By signing my name below, I, [name], attest that this documentation has been prepared under the direction and in the presence of Tami Lin, MD.  Electronically Signed: [name], Medical Scribe. 02/11/2015. 10:09 AM.   HPI HPI Comments: Justin Ray is a 50 y.o. male with a history of secondary cardiomyopathy, congestive heart failure, chronic systolic heart failure, dyslipidemia, COPD with asthma, and DM who presents to the [facility] for a follow-up for diabetes, with improved control at last visit in October. Also time for metabolic profile and PSA. Patient notes his blood sugar has been labile, but it has been more stable over the past few days. He sometimes adjusts his insulin.  Colonoscopy October 2016, no evidence of cancer. Positive diverticulosis.   Echo was in August 2015 and was improved. He had a nuclear stress test a few weeks ago and notes that he was initially told the results were fine, but then he was called the next day and was told the results were abnormal. Some of his medications were also changed at that time, but there seemed to be some disparity between what the nurse and what the doctor told him regarding his medications. Patient is unsatisfied with the conflicting stories and perceived lack of communication at the practice, and would like to change primary cardiology to Dr Lovena Le who manages his ICD.   Patient has noticed frequent extra heartbeats for the past 3 months (sometimes as often as 15-30 times in an hour). He has not felt his pacemaker go off. He has felt SOB but attributes this to smoking. Patient denies leg swelling.  Patient has failed Chantix in the past for smoking cessation. He has also tried nicotine patches, but was unsuccessful.   Review of Systems  Constitutional: Negative for fever, activity change, appetite change and  unexpected weight change.  HENT: Negative for trouble swallowing and voice change.   Eyes: Negative for visual disturbance.  Respiratory: Negative for cough, chest tightness, shortness of breath and wheezing.   Cardiovascular: Positive for palpitations. Negative for chest pain and leg swelling.  Neurological: Negative for dizziness and headaches.  Hematological: Negative for adenopathy.  Psychiatric/Behavioral:       Depression more stable over last few months       Objective:   Physical Exam  Constitutional: He is oriented to person, place, and time. He appears well-developed and well-nourished. No distress.  HENT:  Head: Normocephalic and atraumatic.  Mouth/Throat: Oropharynx is clear and moist. No oropharyngeal exudate.  Eyes: Pupils are equal, round, and reactive to light.  Neck: Neck supple.  Cardiovascular: Normal rate.   Rate of 70. No premature beats.  Pulmonary/Chest: Effort normal.  Musculoskeletal: He exhibits no edema.  No peripheral edema.  Neurological: He is alert and oriented to person, place, and time. No cranial nerve deficit.  Skin: Skin is warm and dry. No rash noted.  Psychiatric: He has a normal mood and affect. His behavior is normal.  Nursing note and vitals reviewed. BP 135/80 mmHg  Pulse 76  Temp(Src) 98.7 F (37.1 C)  Resp 16  Ht _0  (1.727 m)  Wt 198 lb (89.812 kg)  BMI 30.11 kg/m2  Results for orders placed or performed in visit on 02/11/15  CBC with Differential/Platelet  Result Value Ref Range   WBC 6.6 4.0 - 10.5 K/uL   RBC 5.39 4.22 -  5.81 MIL/uL   Hemoglobin 17.2 (H) 13.0 - 17.0 g/dL   HCT 48.9 39.0 - 52.0 %   MCV 90.7 78.0 - 100.0 fL   MCH 31.9 26.0 - 34.0 pg   MCHC 35.2 30.0 - 36.0 g/dL   RDW 13.5 11.5 - 15.5 %   Platelets 138 (L) 150 - 400 K/uL   MPV 9.7 8.6 - 12.4 fL   Neutrophils Relative % 66 43 - 77 %   Neutro Abs 4.4 1.7 - 7.7 K/uL   Lymphocytes Relative 24 12 - 46 %   Lymphs Abs 1.6 0.7 - 4.0 K/uL   Monocytes Relative  8 3 - 12 %   Monocytes Absolute 0.5 0.1 - 1.0 K/uL   Eosinophils Relative 2 0 - 5 %   Eosinophils Absolute 0.1 0.0 - 0.7 K/uL   Basophils Relative 0 0 - 1 %   Basophils Absolute 0.0 0.0 - 0.1 K/uL   Smear Review Criteria for review not met   Comprehensive metabolic panel  Result Value Ref Range   Sodium 140 135 - 146 mmol/L   Potassium 4.3 3.5 - 5.3 mmol/L   Chloride 105 98 - 110 mmol/L   CO2 25 20 - 31 mmol/L   Glucose, Bld 163 (H) 65 - 99 mg/dL   BUN 15 7 - 25 mg/dL   Creat 0.84 0.70 - 1.33 mg/dL   Total Bilirubin 0.6 0.2 - 1.2 mg/dL   Alkaline Phosphatase 68 40 - 115 U/L   AST 31 10 - 35 U/L   ALT 51 (H) 9 - 46 U/L   Total Protein 6.6 6.1 - 8.1 g/dL   Albumin 4.1 3.6 - 5.1 g/dL   Calcium 8.7 8.6 - 10.3 mg/dL  PSA  Result Value Ref Range   PSA 0.78 <=4.00 ng/mL  POCT glycosylated hemoglobin (Hb A1C)  Result Value Ref Range   Hemoglobin A1C 8.8     Lipids at Dr Hali Marry 1 mo ago=[id number], HDL 27 and was rec to chg to vascepa 2g bid from [facility]---Perfusion 01/18/15 no ischemia tho incomplte reading in careeverywhere     Assessment & Plan:  Uncontrolled type 2 diabetes mellitus with hyperglycemia, with long-term current use of insulin (HCC) - Plan: POCT glycosylated hemoglobin (Hb A1C), CBC with Differential/Platelet, Comprehensive metabolic panel  Dysthymic disorder---no change in meds  Dyslipidemia-primarily triglycerides---reck//reinforced dietary changes needed plus wt loss plus better DM control  Screening for prostate cancer - Plan: PSA  Nicotine addiction (with COPD/RAD)--will ref for hypnotism for both smoking and dietary changes as all else has not been successful  Cardiomyopathy, CHF, ICD---will start cardiology referral   I have completed the patient encounter in its entirety as documented by the scribe, with editing by me where necessary. Robert P. Laney Pastor, M.D.

## 2015-02-12 LAB — PSA: PSA: 0.78 ng/mL (ref <=4.00)

## 2015-02-13 LAB — LIPID PANEL
CHOL/HDL RATIO: 2.7 ratio (ref <=5.0)
CHOLESTEROL: 73 mg/dL — AB (ref 125–200)
HDL: 27 mg/dL — AB (ref >=40)
LDL Cholesterol: 1 mg/dL (ref <130)
Triglycerides: 225 mg/dL — ABNORMAL HIGH (ref <150)
VLDL: 45 mg/dL — ABNORMAL HIGH (ref <30)

## 2015-02-16 ENCOUNTER — Encounter: Payer: Self-pay | Admitting: Cardiology

## 2015-02-18 ENCOUNTER — Ambulatory Visit (INDEPENDENT_AMBULATORY_CARE_PROVIDER_SITE_OTHER): Payer: 59 | Admitting: Internal Medicine

## 2015-02-18 DIAGNOSIS — L259 Unspecified contact dermatitis, unspecified cause: Secondary | ICD-10-CM

## 2015-02-18 MED ORDER — PREDNISONE 20 MG PO TABS: ORAL_TABLET | ORAL | Status: DC

## 2015-02-18 MED ORDER — TRIAMCINOLONE ACETONIDE 0.5 % EX CREA: 1.0000 "application " | TOPICAL_CREAM | Freq: Three times a day (TID) | CUTANEOUS | Status: DC

## 2015-02-18 NOTE — Progress Notes (Signed)
Subjective:  By signing my name below, I, Justin Ray, attest that this documentation has been prepared under the direction and in the presence of Leandrew Koyanagi, MD Electronically Signed: Ladene Artist, ED Scribe 02/18/2015 at 12:11 PM.   Patient ID: Justin Ray, male    DOB: March 26, 1965, 50 y.o.   MRN: [id number]  Chief Complaint  Patient presents with  . Rash    right side   HPI HPI Comments: Justin Ray is a 50 y.o. male who presents to the [facility] complaining of a gradually worsening painful, pruritic rash to right lower arm and right upper leg first noticed 3-4 days ago. Pt suspects that he was exposed to poison ivy since he recently worked in a Conservation officer, nature. Pt works for [employer]. He reports associated swelling and drainage from the area 2 days ago. Pt has tried Calamine lotion and itch relief cream without significant relief. No other complaints reported at this time.   Past Medical History  Diagnosis Date  . CHF (congestive heart failure) ([place])   . Cardiomyopathy   . Dyslipidemia   . Diabetes mellitus   . Anxiety   . Hypertension   . Cataract   . Wears glasses   . Sleep apnea     was tested many yr ago-does not use a cpap-  . ICD (implantable cardioverter-defibrillator) in place   . Carpal tunnel syndrome 10/14    right-[facility]   Current Outpatient Prescriptions on File Prior to Visit  Medication Sig Dispense Refill  . ALPRAZolam (XANAX) 1 MG tablet Take 1 tablet (1 mg total) by mouth 2 (two) times daily as needed for anxiety (for anxiety). 60 tablet 5  . aspirin 81 MG tablet Take 81 mg by mouth daily.     . blood glucose meter kit and supplies KIT Test blood sugar 3 times daily. Dx code: E11.65 1 each 0  . carvedilol (COREG) 25 MG tablet take 1 tablet by mouth twice a day with A MEAL 180 tablet 1  . CRESTOR 10 MG tablet take 1 tablet by mouth once daily (Patient taking differently: take 2 tablets by mouth once daily) 90 tablet 2  .  desvenlafaxine (PRISTIQ) 50 MG 24 hr tablet TAKE 1 TABLET BY MOUTH ONCE DAILY. 30 tablet 1  . DIGOX 125 MCG tablet take 1 tablet by mouth once daily 90 tablet 1  . furosemide (LASIX) 40 MG tablet Take 1 tablet (40 mg total) by mouth as needed. 30 tablet 6  . glucose blood test strip Test blood sugar 3 times daily. Dx code: E11.65 300 each 3  . HUMULIN 70/30 (70-30) 100 UNIT/ML injection inject 65 units every morning and 50 units every evening 40 vial 3  . Insulin Syringe-Needle U-100 (BD INSULIN SYRINGE ULTRAFINE) 31G X 15/64" 1 ML MISC USE AS DIRECTED TO INJECT INSULIN TWICE A DAY. 100 each 6  . Lancets MISC Test blood sugar 3 times daily. Dx code: E11.65 300 each 3  . lisinopril (PRINIVIL,ZESTRIL) 20 MG tablet take 1 tablet by mouth twice a day 180 tablet 2  . triazolam (HALCION) 0.25 MG tablet Take 1 tablet (0.25 mg total) by mouth at bedtime as needed for sleep. 30 tablet 2   No current facility-administered medications on file prior to visit.   Allergies  Allergen Reactions  . Codeine Other (See Comments)    Severe HA's.  . Niaspan [Niacin Er] Other (See Comments)    Per pt feels like skin is  burning( REDNESS), and pins/needles are sticking   . Sulfonamide Derivatives Other (See Comments)    thrush   Review of Systems  Skin: Positive for rash.      Objective:   Physical Exam  Constitutional: He is oriented to person, place, and time. He appears well-developed and well-nourished. No distress.  HENT:  Head: Normocephalic and atraumatic.  Eyes: Conjunctivae and EOM are normal.  Neck: Neck supple.  Cardiovascular: Normal rate.   Pulmonary/Chest: Effort normal. No respiratory distress.  Musculoskeletal: Normal range of motion.  Neurological: He is alert and oriented to person, place, and time.  Skin: Skin is warm and dry. Rash noted.  R arm has an acute eczematous reaction from mid triceps to wrist without secondary infection.   Psychiatric: He has a normal mood and affect. His  behavior is normal.  Nursing note and vitals reviewed.     Assessment & Plan:  Contact dermatitis  Meds ordered this encounter  Medications  . triamcinolone cream (KENALOG) 0.5 %    Sig: Apply 1 application topically 3 (three) times daily.    Dispense:  60 g    Refill:  0  . predniSONE (DELTASONE) 20 MG tablet    Sig: 3/2/1 single daily dose for 3 days    Dispense:  6 tablet    Refill:  0    I have completed the patient encounter in its entirety as documented by the scribe, with editing by me where necessary. Jamara Vary P. Laney Pastor, M.D.

## 2015-02-25 ENCOUNTER — Encounter: Payer: Self-pay | Admitting: Cardiology

## 2015-03-25 ENCOUNTER — Encounter (HOSPITAL_COMMUNITY): Payer: Self-pay | Admitting: Psychiatry

## 2015-03-25 ENCOUNTER — Ambulatory Visit (INDEPENDENT_AMBULATORY_CARE_PROVIDER_SITE_OTHER): Payer: 59 | Admitting: Psychiatry

## 2015-03-25 VITALS — BP 146/84 | HR 83 | Ht 68.0 in | Wt 202.6 lb

## 2015-03-25 DIAGNOSIS — F32A Depression, unspecified: Secondary | ICD-10-CM

## 2015-03-25 DIAGNOSIS — F329 Major depressive disorder, single episode, unspecified: Secondary | ICD-10-CM

## 2015-03-25 DIAGNOSIS — F17200 Nicotine dependence, unspecified, uncomplicated: Secondary | ICD-10-CM

## 2015-03-25 NOTE — Progress Notes (Signed)
[facility] Initial Assessment Note  Justin Ray [id number] 50 y.o.  03/25/2015 9:56 AM  Chief Complaint:  I have nicotine addiction.  I want to stop it.  I a lot of health issues.  History of Present Illness:  Patient is 50 year old widowed married Caucasian man who is referred from his primary care physician to get help from his nicotine addiction.  Patient is smoking at least 2 pack a day for past 35 years.  He has multiple health issues which includes cardiomyopathy, hypertension, diabetes, obesity, dyslipidemia.  He has defibrillator in place.  He had tried Chantix, Wellbutrin, nicotine patch but with very little response.  He is also taking antidepressant from his primary care physician which he believes helps a lot.  Patient remember stopping smoking for 2 weeks last year when he was sick and did not like the taste of the cigarettes.  He lives by himself.  His wife decease in 03/25/07 due to blood clot .  He remember being very depressed and sad at that time.  He also endorse having passive suicidal thoughts in 03/25/2007 but antidepressant helped him.  Though he denies any suicidal thoughts or homicidal thought but admitted some time hear voices from her deceased wife and also see her shadows and the night.  However he realizes that is his imagination .  He also admitted some time crying spells when he remember his wife.  He was married to his wife for more than 18 years.  Patient has no children.  Overall he described his mood stable and denies any feeling of hopelessness, anhedonia, feeling of worthlessness, active suicidal thoughts or delusions or paranoia.  His energy level is okay.  He described his mood most of the time good.  He is working at [employer] for more than 23 years.  He likes his job.  He does not recall any mania, psychosis, impulsive behavior, panic attacks or agoraphobia.  He has no history of physical sexual verbal abuse.  She denies any nightmares or  flashbacks.  Currently he is taking Xanax 1 mg twice a day as needed, Halcion 0.25 mg at bedtime as needed and Pristiq 50 mg daily.  He is not seeing any therapist but open to see a counselor if needed.  Suicidal Ideation: No Plan Formed: No Patient has means to carry out plan: No  Homicidal Ideation: No Plan Formed: No Patient has means to carry out plan: No  Past Psychiatric History/Hospitalization(s): Patient denies any history of psychiatric inpatient treatment, suicidal attempt, psychosis, mania, aggressive behavior.  He start taking antidepressant and 9095.  At that time he was very sad and depressed losing his mother who died at young age due to cancer.  He remember having depressive symptoms get worse when his wife died in Mar 25, 2007 due to blood clot.  He has never seen psychiatrists.  He had tried Effexor which worked very well for him but insurance does not cover Effexor.  He also had tried Wellbutrin, Chantix and nicotine patch to stop smoking.  Anxiety: Yes Bipolar Disorder: No Depression: Yes Mania: No Psychosis: No Schizophrenia: No Personality Disorder: No Hospitalization for psychiatric illness: No History of Electroconvulsive Shock Therapy: No Prior Suicide Attempts: No  Family History; Patient denies any family history of psychiatric illness.  Medical History; Patient has cardiomyopathy, hypertension, diabetes, obesity, dyslipidemia and he has defibrillator in place.  Traumatic brain injury: Patient denies any history of traumatic brain injury.  Education and Work History; Patient is high school  graduate.  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  Psychosocial History; Patient born and raised in [facility].  Both parents are deceased.  He is widowed since 2009 when his wife who he married for 18 years died due to blood clot.  Patient has no children.  He has 2 brothers who live out of  town.  Legal History; Patient denies any legal issues.  History Of Abuse; Patient denies any history of abuse.  Substance Abuse History; Patient endorse history of smoking for more than 35 years.  He smoked 2 Pak every day.  He claims to be sober from smoking for only 2 weeks last year when he was sick and did not like the taste.  He tried to stop smoking by taking Wellbutrin, Chantix and nicotine patch but it did not respond.  Patient denies any history of drinking or using any illegal substances.  Review of Systems: Psychiatric: Agitation: No Hallucination: No Depressed Mood: No Insomnia: No Hypersomnia: No Altered Concentration: No Feels Worthless: No Grandiose Ideas: No Belief In Special Powers: No New/Increased Substance Abuse: No Compulsions: No  Neurologic: Headache: No Seizure: No Paresthesias: No   Outpatient Encounter Prescriptions as of 03/25/2015  Medication Sig  . ALPRAZolam (XANAX) 1 MG tablet Take 1 tablet (1 mg total) by mouth 2 (two) times daily as needed for anxiety (for anxiety).  Marland Kitchen aspirin 81 MG tablet Take 81 mg by mouth daily.   . blood glucose meter kit and supplies KIT Test blood sugar 3 times daily. Dx code: E11.65  . carvedilol (COREG) 25 MG tablet take 1 tablet by mouth twice a day with A MEAL  . desvenlafaxine (PRISTIQ) 50 MG 24 hr tablet TAKE 1 TABLET BY MOUTH ONCE DAILY.  Marland Kitchen DIGOX 125 MCG tablet take 1 tablet by mouth once daily  . furosemide (LASIX) 40 MG tablet Take 1 tablet (40 mg total) by mouth as needed.  Marland Kitchen glucose blood test strip Test blood sugar 3 times daily. Dx code: E11.65  . HUMULIN 70/30 (70-30) 100 UNIT/ML injection inject 65 units every morning and 50 units every evening  . Insulin Syringe-Needle U-100 (BD INSULIN SYRINGE ULTRAFINE) 31G X 15/64" 1 ML MISC USE AS DIRECTED TO INJECT INSULIN TWICE A DAY.  Marland Kitchen Lancets MISC Test blood sugar 3 times daily. Dx code: E11.65  . lisinopril (PRINIVIL,ZESTRIL) 20 MG tablet take 1 tablet by  mouth twice a day  . triazolam (HALCION) 0.25 MG tablet Take 1 tablet (0.25 mg total) by mouth at bedtime as needed for sleep.  . [DISCONTINUED] CRESTOR 10 MG tablet take 1 tablet by mouth once daily (Patient taking differently: take 2 tablets by mouth once daily)  . rosuvastatin (CRESTOR) 20 MG tablet Take 20 mg by mouth daily.  Marland Kitchen VASCEPA 1 g CAPS   . [DISCONTINUED] predniSONE (DELTASONE) 20 MG tablet 3/2/1 single daily dose for 3 days (Patient not taking: Reported on 03/25/2015)  . [DISCONTINUED] triamcinolone cream (KENALOG) 0.5 % Apply 1 application topically 3 (three) times daily. (Patient not taking: Reported on 03/25/2015)   No facility-administered encounter medications on file as of 03/25/2015.    Recent Results (from the past 2160 hour(s))  Implantable device - remote     Status: None   Collection Time: 02/07/15  4:20 PM  Result Value Ref Range   Date Time Interrogation Session [id number]    Pulse Generator Manufacturer BOST    Pulse Gen Model  E141 ENERGEN VR    Pulse Gen Serial Number [id number]    Implantable Pulse Generator Type Implantable Cardiac Defibulator    Implantable Pulse Generator Implant Date [id number]    Implantable Lead Manufacturer [facility]    Implantable Lead Model 0185    Implantable Lead Serial Number [id number]    Implantable Lead Implant Date [id number]    Implantable Lead Location [phone]    Lead Channel Setting Sensing Sensitivity 0.6 mV   Lead Channel Setting Sensing Adaptation Mode Adaptive Sensing    Lead Channel Setting Pacing Pulse Width 0.4 ms   Lead Channel Setting Pacing Amplitude 2.6 V   Lead Channel Impedance Value 550 ohm   Lead Channel Sensing Intrinsic Amplitude 11.5 mV   HighPow Impedance 64 ohm   Battery Status BOS    Battery Remaining Longevity 126 mo   Battery Remaining Percentage 100 %   Brady Statistic RV Percent Paced 0 %   Eval Rhythm Vs   CBC with Differential/Platelet     Status: Abnormal   Collection Time: 02/11/15  9:58 AM   Result Value Ref Range   WBC 6.6 4.0 - 10.5 K/uL   RBC 5.39 4.22 - 5.81 MIL/uL   Hemoglobin 17.2 (H) 13.0 - 17.0 g/dL   HCT 48.9 39.0 - 52.0 %   MCV 90.7 78.0 - 100.0 fL   MCH 31.9 26.0 - 34.0 pg   MCHC 35.2 30.0 - 36.0 g/dL   RDW 13.5 11.5 - 15.5 %   Platelets 138 (L) 150 - 400 K/uL   MPV 9.7 8.6 - 12.4 fL   Neutrophils Relative % 66 43 - 77 %   Neutro Abs 4.4 1.7 - 7.7 K/uL   Lymphocytes Relative 24 12 - 46 %   Lymphs Abs 1.6 0.7 - 4.0 K/uL   Monocytes Relative 8 3 - 12 %   Monocytes Absolute 0.5 0.1 - 1.0 K/uL   Eosinophils Relative 2 0 - 5 %   Eosinophils Absolute 0.1 0.0 - 0.7 K/uL   Basophils Relative 0 0 - 1 %   Basophils Absolute 0.0 0.0 - 0.1 K/uL   Smear Review Criteria for review not met   Comprehensive metabolic panel     Status: Abnormal   Collection Time: 02/11/15  9:58 AM  Result Value Ref Range   Sodium 140 135 - 146 mmol/L   Potassium 4.3 3.5 - 5.3 mmol/L   Chloride 105 98 - 110 mmol/L   CO2 25 20 - 31 mmol/L   Glucose, Bld 163 (H) 65 - 99 mg/dL   BUN 15 7 - 25 mg/dL   Creat 0.84 0.70 - 1.33 mg/dL   Total Bilirubin 0.6 0.2 - 1.2 mg/dL   Alkaline Phosphatase 68 40 - 115 U/L   AST 31 10 - 35 U/L   ALT 51 (H) 9 - 46 U/L   Total Protein 6.6 6.1 - 8.1 g/dL   Albumin 4.1 3.6 - 5.1 g/dL   Calcium 8.7 8.6 - 10.3 mg/dL  PSA     Status: None   Collection Time: 02/11/15  9:58 AM  Result Value Ref Range   PSA 0.78 <=4.00 ng/mL    Comment: Test Methodology: ECLIA PSA (Electrochemiluminescence Immunoassay)   For PSA values from 2.5-4.0, particularly in younger men <9 years old, the AUA and NCCN suggest testing for % Free PSA (3515) and evaluation of the rate of increase in PSA (PSA velocity).   Lipid panel     Status: Abnormal   Collection Time:  02/11/15  9:58 AM  Result Value Ref Range   Cholesterol 73 (L) 125 - 200 mg/dL   Triglycerides 225 (H) <150 mg/dL   HDL 27 (L) >=40 mg/dL   Total CHOL/HDL Ratio 2.7 <=5.0 Ratio   VLDL 45 (H) <30 mg/dL   LDL  Cholesterol 1 <130 mg/dL    Comment:   Total Cholesterol/HDL Ratio:CHD Risk                        Coronary Heart Disease Risk Table                                        Men       Women          1/2 Average Risk              3.4        3.3              Average Risk              5.0        4.4           2X Average Risk              9.6        7.1           3X Average Risk             23.4       11.0 Use the calculated Patient Ratio above and the CHD Risk table  to determine the patient's CHD Risk.   POCT glycosylated hemoglobin (Hb A1C)     Status: None   Collection Time: 02/11/15 10:32 AM  Result Value Ref Range   Hemoglobin A1C 8.8       Constitutional:  BP 146/84 mmHg  Pulse 83  Ht '5\' 8"'$  (1.727 m)  Wt 202 lb 9.6 oz (91.899 kg)  BMI 30.81 kg/m2   Musculoskeletal: Strength & Muscle Tone: within normal limits Gait & Station: normal Patient leans: N/A  Psychiatric Specialty Exam: General Appearance: Casual  Eye Contact::  Good  Speech:  Normal Rate  Volume:  Normal  Mood:  Anxious  Affect:  Appropriate  Thought Process:  Coherent  Orientation:  Full (Time, Place, and Person)  Thought Content:  WDL  Suicidal Thoughts:  No  Homicidal Thoughts:  No  Memory:  Immediate;   Good Recent;   Good Remote;   Good  Judgement:  Good  Insight:  Good  Psychomotor Activity:  Normal  Concentration:  Good  Recall:  Good  Fund of Knowledge:  Good  Language:  Good  Akathisia:  No  Handed:  Right  AIMS (if indicated):     Assets:  Communication Skills Desire for Improvement Financial Resources/Insurance Housing  ADL's:  Intact  Cognition:  WNL  Sleep:        New problem, with additional work up planned, Review of Psycho-Social Stressors (1), Review or order clinical lab tests (1), Decision to obtain old records (1), Review and summation of old records (2), Established Problem, Worsening (2) and Review of Medication Regimen & Side Effects (2)  Assessment: Axis I:  Nicotine dependence.  Depressive disorder NOS  Axis II: Deferred  Axis III:  Past Medical History  Diagnosis Date  . CHF (congestive heart failure) ([place])   .  Cardiomyopathy   . Dyslipidemia   . Diabetes mellitus   . Anxiety   . Hypertension   . Cataract   . Wears glasses   . Sleep apnea     was tested many yr ago-does not use a cpap-  . ICD (implantable cardioverter-defibrillator) in place   . Carpal tunnel syndrome 10/14    right-[facility]     Plan:  I review his symptoms, history, current medication, recent blood work results and psychosocial stressors.  He had tried Wellbutrin, Chantix, nicotine patch with limited response to stop smoking.  I do believe patient requires counseling to help his nicotine addiction.  At this time he does not feel that his anxiety or depression causing increase in her smoking.  He's been smoking for more than 35 years.  We talk about that he will require motivation and self-determination to quit smoking.  However at counseling and therapy may be beneficial.  He is taking Xanax 1 mg twice a day as needed, Halcion 0.25 mg at bedtime for insomnia and Pristiq 50 mg daily.  His psychiatric medicines are managed by his primary care physician.  Currently he has no other symptoms however I recommended that his primary care physician consider increasing Pristiq 100 mg if he still have residual anxiety and depression.  Patient agreed with the plan and promised to call us back if he feel worsening of the symptoms.  He will schedule appointment with Tharon Aquas for therapy.  I discuss safety plan that anytime having active suicidal thoughts or homicidal thought that he need to call 911 or go to local emergency room.  Ryland Tungate T., MD 03/25/2015

## 2015-03-30 ENCOUNTER — Other Ambulatory Visit: Payer: Self-pay

## 2015-03-30 MED ORDER — LISINOPRIL 20 MG PO TABS: 20.0000 mg | ORAL_TABLET | Freq: Two times a day (BID) | ORAL | Status: DC

## 2015-04-10 ENCOUNTER — Other Ambulatory Visit: Payer: Self-pay | Admitting: Physician Assistant

## 2015-04-13 ENCOUNTER — Ambulatory Visit (HOSPITAL_COMMUNITY): Payer: Self-pay | Admitting: Clinical

## 2015-04-15 ENCOUNTER — Other Ambulatory Visit: Payer: Self-pay

## 2015-04-15 MED ORDER — DESVENLAFAXINE SUCCINATE ER 50 MG PO TB24: ORAL_TABLET | ORAL | Status: DC

## 2015-05-04 ENCOUNTER — Ambulatory Visit (INDEPENDENT_AMBULATORY_CARE_PROVIDER_SITE_OTHER): Payer: 59 | Admitting: Internal Medicine

## 2015-05-04 ENCOUNTER — Encounter: Payer: Self-pay | Admitting: Internal Medicine

## 2015-05-04 VITALS — BP 134/82 | HR 76 | Temp 98.6°F | Resp 16 | Ht 68.0 in | Wt 199.0 lb

## 2015-05-04 DIAGNOSIS — F341 Dysthymic disorder: Secondary | ICD-10-CM | POA: Diagnosis not present

## 2015-05-04 DIAGNOSIS — Z794 Long term (current) use of insulin: Secondary | ICD-10-CM | POA: Diagnosis not present

## 2015-05-04 DIAGNOSIS — E785 Hyperlipidemia, unspecified: Secondary | ICD-10-CM

## 2015-05-04 DIAGNOSIS — Z95810 Presence of automatic (implantable) cardiac defibrillator: Secondary | ICD-10-CM | POA: Diagnosis not present

## 2015-05-04 DIAGNOSIS — I5022 Chronic systolic (congestive) heart failure: Secondary | ICD-10-CM

## 2015-05-04 DIAGNOSIS — I5042 Chronic combined systolic (congestive) and diastolic (congestive) heart failure: Secondary | ICD-10-CM | POA: Diagnosis not present

## 2015-05-04 DIAGNOSIS — J449 Chronic obstructive pulmonary disease, unspecified: Secondary | ICD-10-CM

## 2015-05-04 DIAGNOSIS — E1165 Type 2 diabetes mellitus with hyperglycemia: Secondary | ICD-10-CM

## 2015-05-04 DIAGNOSIS — J45909 Unspecified asthma, uncomplicated: Secondary | ICD-10-CM

## 2015-05-04 DIAGNOSIS — F17200 Nicotine dependence, unspecified, uncomplicated: Secondary | ICD-10-CM | POA: Diagnosis not present

## 2015-05-04 DIAGNOSIS — J4489 Other specified chronic obstructive pulmonary disease: Secondary | ICD-10-CM

## 2015-05-04 DIAGNOSIS — I429 Cardiomyopathy, unspecified: Secondary | ICD-10-CM

## 2015-05-04 LAB — POCT GLYCOSYLATED HEMOGLOBIN (HGB A1C): HEMOGLOBIN A1C: 7.5

## 2015-05-04 MED ORDER — NICOTINE 14 MG/24HR TD PT24: 14.0000 mg | MEDICATED_PATCH | Freq: Every day | TRANSDERMAL | Status: DC

## 2015-05-04 MED ORDER — DESVENLAFAXINE SUCCINATE ER 100 MG PO TB24: ORAL_TABLET | ORAL | Status: DC

## 2015-05-04 MED ORDER — TRIAZOLAM 0.25 MG PO TABS: 0.2500 mg | ORAL_TABLET | Freq: Every evening | ORAL | Status: DC | PRN

## 2015-05-04 MED ORDER — FUROSEMIDE 40 MG PO TABS: 40.0000 mg | ORAL_TABLET | ORAL | Status: DC | PRN

## 2015-05-04 MED ORDER — INSULIN NPH ISOPHANE & REGULAR (70-30) 100 UNIT/ML ~~LOC~~ SUSP: SUBCUTANEOUS | Status: DC

## 2015-05-04 MED ORDER — NICOTINE 21 MG/24HR TD PT24: 21.0000 mg | MEDICATED_PATCH | Freq: Every day | TRANSDERMAL | Status: DC

## 2015-05-04 MED ORDER — ALPRAZOLAM 1 MG PO TABS: 1.0000 mg | ORAL_TABLET | Freq: Two times a day (BID) | ORAL | Status: DC | PRN

## 2015-05-04 MED ORDER — NICOTINE 7 MG/24HR TD PT24: 7.0000 mg | MEDICATED_PATCH | Freq: Every day | TRANSDERMAL | Status: DC

## 2015-05-04 NOTE — Patient Instructions (Signed)
     IF you received an x-ray today, you will receive an invoice from [facility]. Please contact [facility] at [phone] with questions or concerns regarding your invoice.   IF you received labwork today, you will receive an invoice from [facility]. Please contact [facility] at [phone] with questions or concerns regarding your invoice.   Our billing staff will not be able to assist you with questions regarding bills from these companies.  You will be contacted with the lab results as soon as they are available. The fastest way to get your results is to activate your My Chart account. Instructions are located on the last page of this paperwork. If you have not heard from us regarding the results in 2 weeks, please contact this office.      

## 2015-05-05 ENCOUNTER — Ambulatory Visit (HOSPITAL_COMMUNITY): Payer: Self-pay | Admitting: Clinical

## 2015-05-05 NOTE — Progress Notes (Addendum)
Subjective:    Patient ID: Justin Ray, male    DOB: 03/28/1965, 50 y.o.   MRN: [id number]  HPIf/u Last OV 02/11/15 A1C 8.8 Wants referral to cardiologist here in [place] to improve heart care including triglycerides--already follows with dr Lovena Le for myopathy and ICD--having trouble with office at [facility] cardiology  Patient Active Problem List   Diagnosis Date Noted  . DM (diabetes mellitus), type 2, uncontrolled ([place]) 01/26/2009    Priority: High Elevated microalbumin in June 2016  Normal creatinine  On lisinopril 20bid Insulin 70/30 65am,50-55pm  . Secondary cardiomyopathy ([place]) 01/26/2009    Priority: High  . Congestive heart failure ([place]) 01/26/2009    Priority: High---coreg/digox/lasix  . Dysthymic disorder---Anx and depr---pristiq and xanax 02/28/2011    Priority: Medium Was referred to Dr Adele Schilder to help establish better medicines for his dysthymia and a counseling program smoking cessation and his psychological symptoms. The node is in the chart. Jayant has not set a counseling date yet. Dr. Adele Schilder suggests increasing Pristiq to 100 mg.   . Automatic implantable cardioverter-defibrillator in situ 02/06/2011    Priority: Medium  . Dyslipidemia-primarily triglycerides---dr Lovena Le recently increased his Crestor to 20 mg and plans to recheck values in June--Dr ravankar has tried some adjustments as well 09/29/2014  . Chronic systolic heart failure ([place]) 04/14/2013  . COPD with asthma ([place]) 02/28/2011  . Nicotine use disorder--he would like to try nicotine patches  02/28/2011  . Insomnia---halcion  02/28/2011   His Diet not helpful--no control Makes some ins adj w/ home BS but not daily   Outpatient Encounter Prescriptions as of 05/04/2015  Medication Sig Note  . ALPRAZolam (XANAX) 1 MG tablet Take 1 tablet (1 mg total) by mouth 2 (two) times daily as needed for anxiety (for anxiety).   Marland Kitchen aspirin 81 MG tablet Take 81 mg by mouth daily.    . blood glucose meter kit and  supplies KIT Test blood sugar 3 times daily. Dx code: E11.65   . carvedilol (COREG) 25 MG tablet take 1 tablet by mouth twice a day with A MEAL   . desvenlafaxine (PRISTIQ) 100 MG 24 hr tablet TAKE 1 TABLET BY MOUTH ONCE DAILY.   Marland Kitchen DIGOX 125 MCG tablet take 1 tablet by mouth once daily   . furosemide (LASIX) 40 MG tablet Take 1 tablet (40 mg total) by mouth as needed.   Marland Kitchen glucose blood test strip Test blood sugar 3 times daily. Dx code: E11.65 06/23/2014: USE  . insulin NPH-regular Human (HUMULIN 70/30) (70-30) 100 UNIT/ML injection inject 65 units subcutaneously every morning and inject 50-55 units subcutaneously every evening   . Insulin Syringe-Needle U-100 (BD INSULIN SYRINGE ULTRAFINE) 31G X 15/64" 1 ML MISC USE AS DIRECTED TO INJECT INSULIN TWICE A DAY.   Marland Kitchen Lancets MISC Test blood sugar 3 times daily. Dx code: E11.65 06/23/2014: USE  . lisinopril (PRINIVIL,ZESTRIL) 20 MG tablet Take 1 tablet (20 mg total) by mouth 2 (two) times daily.   . rosuvastatin (CRESTOR) 20 MG tablet Take 20 mg by mouth daily. 03/25/2015: Received from: External Pharmacy  . triazolam (HALCION) 0.25 MG tablet Take 1 tablet (0.25 mg total) by mouth at bedtime as needed for sleep.   Marland Kitchen VASCEPA 1 g CAPS  03/25/2015: Received from: External Pharmacy   No facility-administered encounter medications on file as of 05/04/2015.    Review of Systems His rash resolved with treatment in February No headaches or vision changes No changes in weight No fatigue No night sweats  No chest pain He continues with random palpitations but has had no cardiac events No cough or dyspnea No GI or GU symptoms    Objective:   Physical Exam BP 134/82 mmHg  Pulse 76  Temp(Src) 98.6 F (37 C)  Resp 16  Ht _0  (1.727 m)  Wt 199 lb (90.266 kg)  BMI 30.26 kg/m2 HEENT clear Heart regular Lungs clear Extremities with good peripheral pulses and no edema Cn 2-12 intact Gait wnl     Assessment & Plan:  Secondary cardiomyopathy ([place]) -  Plan: Ambulatory referral to Cardiology--perhaps just to see if Dr. Dorothea Ogle will take over all of his care and he can discontinue follow-up-[place]  Chronic combined systolic and diastolic congestive heart failure ([place]) -   Automatic implantable cardioverter-defibrillator in situ -  Uncontrolled type 2 diabetes mellitus with hyperglycemia, with long-term current use of insulin ([place]) - Plan: POCT glycosylated hemoglobin (Hb A1C)= Lab Results  Component Value Date   HGBA1C 7.5 05/04/2015  This represents improvement and he will continue to adjust his insulin accordingly//he needs to pay more attention to diet with weight loss   Nicotine use disorder--he will use nicotine patches over the next 3 months as directed  Dyslipidemia-primarily triglycerides--follow-up cardiology  COPD with asthma (HCC)--stable  Dysthymic disorder-Depression, anxiety, insomnia--- -he was on Remeron in 2016 which helped significantly with his sleep but this was discontinued for some reason that is unclear to me -Pristiq increased to 100 mg -He is more stable now than in past months and is reluctant to start counseling  Meds ordered this encounter  Medications  . desvenlafaxine (PRISTIQ) 100 MG 24 hr tablet    Sig: TAKE 1 TABLET BY MOUTH ONCE DAILY.    Dispense:  90 tablet    Refill:  3  . triazolam (HALCION) 0.25 MG tablet    Sig: Take 1 tablet (0.25 mg total) by mouth at bedtime as needed for sleep.    Dispense:  30 tablet    Refill:  5  . insulin NPH-regular Human (HUMULIN 70/30) (70-30) 100 UNIT/ML injection    Sig: inject 65 units subcutaneously every morning and inject 50-55 units subcutaneously every evening    Dispense:  40 vial    Refill:  11  . furosemide (LASIX) 40 MG tablet----he was out of this and so I'll refill     Sig: Take 1 tablet (40 mg total) by mouth as needed.    Dispense:  30 tablet    Refill:  6  . ALPRAZolam (XANAX) 1 MG tablet    Sig: Take 1 tablet (1 mg total) by mouth 2 (two)  times daily as needed for anxiety (for anxiety).    Dispense:  60 tablet    Refill:  5  . nicotine (EQ NICOTINE) 21 mg/24hr patch    Sig: Place 1 patch (21 mg total) onto the skin daily.    Dispense:  28 patch    Refill:  0  . nicotine (EQ NICOTINE) 14 mg/24hr patch    Sig: Place 1 patch (14 mg total) onto the skin daily.    Dispense:  28 patch    Refill:  0  . nicotine (EQ NICOTINE) 7 mg/24hr patch    Sig: Place 1 patch (7 mg total) onto the skin daily.    Dispense:  28 patch    Refill:  0   We discussed follow-up with new primary care provider is I retire soon. He is given options in [place] or he may return  here to see one of our providers--he is also given names of primary care providers at [facility] on [address] Follow-up in 3-4 months He will see Dr. Lovena Le in June   Addendum:Eye Dr. Is Dr. Patrice Paradise in [place], [phone]. His last visit was in Sept. 2016

## 2015-05-06 ENCOUNTER — Ambulatory Visit (INDEPENDENT_AMBULATORY_CARE_PROVIDER_SITE_OTHER): Payer: 59 | Admitting: Cardiovascular Disease

## 2015-05-06 ENCOUNTER — Other Ambulatory Visit: Payer: Self-pay | Admitting: Cardiovascular Disease

## 2015-05-06 ENCOUNTER — Encounter: Payer: Self-pay | Admitting: Cardiovascular Disease

## 2015-05-06 VITALS — BP 130/80 | HR 73 | Ht 68.0 in | Wt 203.0 lb

## 2015-05-06 DIAGNOSIS — Z79899 Other long term (current) drug therapy: Secondary | ICD-10-CM | POA: Diagnosis not present

## 2015-05-06 DIAGNOSIS — I5042 Chronic combined systolic (congestive) and diastolic (congestive) heart failure: Secondary | ICD-10-CM

## 2015-05-06 DIAGNOSIS — E782 Mixed hyperlipidemia: Secondary | ICD-10-CM

## 2015-05-06 DIAGNOSIS — I5022 Chronic systolic (congestive) heart failure: Secondary | ICD-10-CM

## 2015-05-06 DIAGNOSIS — F411 Generalized anxiety disorder: Secondary | ICD-10-CM

## 2015-05-06 DIAGNOSIS — Z794 Long term (current) use of insulin: Secondary | ICD-10-CM

## 2015-05-06 DIAGNOSIS — R0609 Other forms of dyspnea: Secondary | ICD-10-CM | POA: Diagnosis not present

## 2015-05-06 DIAGNOSIS — E1165 Type 2 diabetes mellitus with hyperglycemia: Secondary | ICD-10-CM

## 2015-05-06 DIAGNOSIS — Z95810 Presence of automatic (implantable) cardiac defibrillator: Secondary | ICD-10-CM

## 2015-05-06 DIAGNOSIS — D751 Secondary polycythemia: Secondary | ICD-10-CM

## 2015-05-06 DIAGNOSIS — G4733 Obstructive sleep apnea (adult) (pediatric): Secondary | ICD-10-CM | POA: Diagnosis not present

## 2015-05-06 DIAGNOSIS — F17200 Nicotine dependence, unspecified, uncomplicated: Secondary | ICD-10-CM

## 2015-05-06 DIAGNOSIS — R0600 Dyspnea, unspecified: Secondary | ICD-10-CM

## 2015-05-06 NOTE — Patient Instructions (Signed)
Your physician recommends that you return for lab work.  Your physician has recommended that you have a sleep study. This test records several body functions during sleep, including: brain activity, eye movement, oxygen and carbon dioxide blood levels, heart rate and rhythm, breathing rate and rhythm, the flow of air through your mouth and nose, snoring, body muscle movements, and chest and belly movement.   Your physician recommends that you schedule a follow-up appointment in: 3 months with dr Claiborne Billings.

## 2015-05-07 ENCOUNTER — Encounter: Payer: Self-pay | Admitting: Family Medicine

## 2015-05-07 ENCOUNTER — Telehealth: Payer: Self-pay | Admitting: Family Medicine

## 2015-05-07 NOTE — Telephone Encounter (Signed)
-----   Message from Leandrew Koyanagi, MD sent at 05/05/2015 10:18 PM EDT ----- Please contact him and find out who does his regular ophthalmology exams. He wears glasses. And then enter this in the chart under quality metrics.

## 2015-05-07 NOTE — Telephone Encounter (Signed)
Eye Dr. Is Dr. Patrice Paradise in [place], [phone]. His last visit was in Sept. 2016. I was unable to add this provider to the care team as it would not come up on epic/computer.

## 2015-05-08 ENCOUNTER — Encounter: Payer: Self-pay | Admitting: Cardiovascular Disease

## 2015-05-08 DIAGNOSIS — D751 Secondary polycythemia: Secondary | ICD-10-CM | POA: Insufficient documentation

## 2015-05-08 DIAGNOSIS — G4733 Obstructive sleep apnea (adult) (pediatric): Secondary | ICD-10-CM | POA: Insufficient documentation

## 2015-05-08 NOTE — Progress Notes (Signed)
Patient ID: Justin Ray, male   DOB: Jun 08, 1965, 50 y.o.   MRN: [id number]     Primary MD: Dr. Tami Lin  PATIENT PROFILE: Justin Ray is a 50 y.o. male who is referred through the courtesy of Dr. Laney Pastor cardiology care with me.  The patient had been previously followed in [place] for his cardiology care has also seen Dr. Lovena Le who follows him from an electrophysiologic standpoint with his history of cardiomyopathy and ICD implantation.   HPI:  Justin Ray has history of chronic systolic heart failure, class II and is felt to have a nonischemic cardiomyopathy.  He underwent insertion of an ICD in January 2006 and in November 2014 underwent ICD generator change by Dr.Taylor.  He has a history of side a/depression, type 2 diabetes mellitus on insulin, hyperlipidemia, and has continued have difficulty with elevated triglycerides.  He had been followed by the cardiology group in [place], but has developed recent discontent with his physician and is requested a new cardiology evaluation.  The patient has been on lisinopril 20 mg twice a day, digoxin 0.125 mg and carvedilol 25 mg twice a day for his coronary myopathy.  Hypertension.  He has been on Crestor 20 mg for hyperlipidemia.  An earlier this year he was started on placebo 2 capsules twice a day due to persistent elevated triglyceride levels.  His last lipid study 2 months ago showed a total cholesterol of 73, triglycerides 225, HDL 27, and EDL 45.  I suspect his LDL is inaccurate and this was recorded as 1.  He has a history of continued tobacco use, and started smoking at age 83 and currently is trying to quit but is still smoking.  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  He does not routinely exercise.  Part further questioning he states he had a sleep study in 2004 and was told of having obstructive sleep apnea but was never started on CPAP therapy.  Presently, he goes to bed  about 1 AM and wakes up at 8 AM.  He often is up at least 2-4 times per night.  His sleep is nonrestorative.  He snores loudly.  He does note daytime sleepiness.  He is diabetic on insulin.  He has been taking Xanax as needed for anxiety.  He presents to establish cardiology care with me.  Past Medical History  Diagnosis Date  . CHF (congestive heart failure) ([place])   . Cardiomyopathy   . Dyslipidemia   . Diabetes mellitus   . Anxiety   . Hypertension   . Cataract   . Wears glasses   . Sleep apnea     was tested many yr ago-does not use a cpap-  . ICD (implantable cardioverter-defibrillator) in place   . Carpal tunnel syndrome 10/14    right-GSC    Past Surgical History  Procedure Laterality Date  . Cardiac defibrillator placement  02/01/2004  . Tonsillectomy    . Eye surgery    . Cardiac defibrillator placement  11/14    replaced with new one  . Carpal tunnel release Left 12/19/2012    Procedure: LEFT CARPAL TUNNEL RELEASE;  Surgeon: Roseanne Kaufman, MD;  Location: [facility];  Service: Orthopedics;  Laterality: Left;  . Implantable cardioverter defibrillator (icd) generator change N/A 11/21/2012    Procedure: ICD GENERATOR CHANGE;  Surgeon: Evans Lance, MD;  Location[facility] C[facility] LAB;  Service: Cardiovascular;  Laterality: N/A;    Allergies  Allergen Reactions  . Codeine Other (See Comments)    Severe HA's.  . Niaspan [Niacin Er] Other (See Comments)    Per pt feels like skin is burning( REDNESS), and pins/needles are sticking   . Sulfonamide Derivatives Other (See Comments)    thrush    Current Outpatient Prescriptions  Medication Sig Dispense Refill  . ALPRAZolam (XANAX) 1 MG tablet Take 1 tablet (1 mg total) by mouth 2 (two) times daily as needed for anxiety (for anxiety). 60 tablet 5  . aspirin 81 MG tablet Take 81 mg by mouth daily.     . blood glucose meter kit and supplies KIT Test blood sugar 3 times daily. Dx code: E11.65 1 each 0  . carvedilol  (COREG) 25 MG tablet take 1 tablet by mouth twice a day with A MEAL 180 tablet 1  . desvenlafaxine (PRISTIQ) 100 MG 24 hr tablet TAKE 1 TABLET BY MOUTH ONCE DAILY. 90 tablet 3  . DIGOX 125 MCG tablet take 1 tablet by mouth once daily 90 tablet 1  . furosemide (LASIX) 40 MG tablet Take 1 tablet (40 mg total) by mouth as needed. 30 tablet 6  . glucose blood test strip Test blood sugar 3 times daily. Dx code: E11.65 300 each 3  . insulin NPH-regular Human (HUMULIN 70/30) (70-30) 100 UNIT/ML injection inject 65 units subcutaneously every morning and inject 50-55 units subcutaneously every evening 40 vial 11  . Insulin Syringe-Needle U-100 (BD INSULIN SYRINGE ULTRAFINE) 31G X 15/64" 1 ML MISC USE AS DIRECTED TO INJECT INSULIN TWICE A DAY. 100 each 6  . Lancets MISC Test blood sugar 3 times daily. Dx code: E11.65 300 each 3  . lisinopril (PRINIVIL,ZESTRIL) 20 MG tablet Take 1 tablet (20 mg total) by mouth 2 (two) times daily. 180 tablet 0  . nicotine (EQ NICOTINE) 14 mg/24hr patch Place 1 patch (14 mg total) onto the skin daily. 28 patch 0  . nicotine (EQ NICOTINE) 21 mg/24hr patch Place 1 patch (21 mg total) onto the skin daily. 28 patch 0  . nicotine (EQ NICOTINE) 7 mg/24hr patch Place 1 patch (7 mg total) onto the skin daily. 28 patch 0  . rosuvastatin (CRESTOR) 20 MG tablet Take 20 mg by mouth daily.  0  . triazolam (HALCION) 0.25 MG tablet Take 1 tablet (0.25 mg total) by mouth at bedtime as needed for sleep. 30 tablet 5  . VASCEPA 1 g CAPS      No current facility-administered medications for this visit.    Social History   Social History  . Marital Status: Widowed    Spouse Name: N/A  . Number of Children: 0  . Years of Education: N/A   Occupational History  . maintenance Unemployed   Social History Main Topics  . Smoking status: Current Every Day Smoker -- 1.50 packs/day    Types: Cigarettes  . Smokeless tobacco: Not on file  . Alcohol Use: No  . Drug Use: No  . Sexual Activity:  No     Comment: number of sex partners in the last 12 months  0   Other Topics Concern  . Not on file   Social History Narrative   Additional social history is notable that he is widowed for 8 years.  He lives by himself.  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  He completed 12th grade of education.  He continues to smoke cigarettes and has been smoking since age 86.  He does drink  occasional beer.  He walks intermittently outside work.  Family History  Problem Relation Age of Onset  . Cancer    . Coronary artery disease Father     CABG  . Cancer Mother     colon  . Heart disease Brother    Additional family history is that his mother died at 76 secondary to cancer.  His father is deceased and had undergone CABG revascularization surgery.  His 2 brothers, ages 59 and 71. ROS General: Negative; No fevers, chills, or night sweats HEENT: Negative; No changes in vision or hearing, sinus congestion, difficulty swallowing Pulmonary: Negative; No cough, wheezing, shortness of breath, hemoptysis Cardiovascular:  See HPI; No chest pain, presyncope, syncope, palpitations, edema GI: Negative; No nausea, vomiting, diarrhea, or abdominal pain GU: Negative; No dysuria, hematuria, or difficulty voiding Musculoskeletal: Negative; no myalgias, joint pain, or weakness Hematologic/Oncologic: Negative; no easy bruising, bleeding Endocrine: Negative; no heat/cold intolerance; no diabetes Neuro: Negative; no changes in balance, headaches Skin: Negative; No rashes or skin lesions Psychiatric: Positive for anxiety and depression Sleep: Positive for poor sleep, snoring, nonrestorative sleep, and daytime sleepiness.; No  bruxism, restless legs, hypnogagnic hallucinations Other comprehensive 14 point system review is negative   Physical Exam BP 130/80 mmHg  Pulse 73  Ht '5\' 8"'$  (1.727 m)  Wt 203 lb (92.08 kg)  BMI 30.87 kg/m2  Wt Readings from Last 3 Encounters:  05/06/15  203 lb (92.08 kg)  05/04/15 199 lb (90.266 kg)  03/25/15 202 lb 9.6 oz (91.899 kg)   General: Alert, oriented, no distress.  Skin: normal turgor, no rashes, warm and dry HEENT: Normocephalic, atraumatic. Pupils equal round and reactive to light; sclera anicteric; extraocular muscles intact; Fundi Mild arteriolar narrowing.  No hemorrhages or exudates Nose without nasal septal hypertrophy Mouth/Parynx benign; Mallinpatti scale 3 Neck: No JVD, no carotid bruits; normal carotid upstroke Lungs: clear to ausculatation and percussion; no wheezing or rales Chest wall: without tenderness to palpitation Heart: PMI not displaced, RRR, s1 s2 normal, 1/6 systolic murmur, no diastolic murmur, no rubs, gallops, thrills, or heaves Abdomen: soft, nontender; no hepatosplenomehaly, BS+; abdominal aorta nontender and not dilated by palpation. Back: no CVA tenderness Pulses 2+ Musculoskeletal: full range of motion, normal strength, no joint deformities Extremities: no clubbing cyanosis or edema, Homan's sign negative  Neurologic: grossly nonfocal; Cranial nerves grossly wnl Psychologic: Normal mood and affect   ECG (independently read by me): Normal sinus rhythm at 73 bpm.  Borderline voltage criteria for LVH.  LABS:  BMP Latest Ref Rng 02/11/2015 06/23/2014 12/09/2013  Glucose 65 - 99 mg/dL 163(H) 137(H) 75  BUN 7 - 25 mg/dL '15 14 12  '$ Creatinine 0.70 - 1.33 mg/dL 0.84 0.81 0.86  Sodium 135 - 146 mmol/L 140 141 142  Potassium 3.5 - 5.3 mmol/L 4.3 4.1 4.5  Chloride 98 - 110 mmol/L 105 109 108  CO2 20 - 31 mmol/L '25 22 25  '$ Calcium 8.6 - 10.3 mg/dL 8.7 8.9 9.5     Hepatic Function Latest Ref Rng 02/11/2015 06/23/2014 12/09/2013  Total Protein 6.1 - 8.1 g/dL 6.6 6.6 7.4  Albumin 3.6 - 5.1 g/dL 4.1 4.1 4.6  AST 10 - 35 U/L 31 31 34  ALT 9 - 46 U/L 51(H) 53 62(H)  Alk Phosphatase 40 - 115 U/L 68 58 67  Total Bilirubin 0.2 - 1.2 mg/dL 0.6 0.5 0.6    CBC Latest Ref Rng 02/11/2015 06/23/2014 12/09/2013    WBC 4.0 - 10.5 K/uL 6.6 6.4 CANCELED  Hemoglobin 13.0 - 17.0 g/dL 17.2(H) 17.5(H) CANCELED  Hematocrit 39.0 - 52.0 % 48.9 50.3 CANCELED  Platelets 150 - 400 K/uL 138(L) 162 CANCELED   Lab Results  Component Value Date   MCV 90.7 02/11/2015   MCV 89.8 06/23/2014   MCV CANCELED 12/09/2013   Lab Results  Component Value Date   TSH 0.800 09/05/2011   Lab Results  Component Value Date   HGBA1C 7.5 05/04/2015     BNP No results found for: BNP  ProBNP No results found for: PROBNP   Lipid Panel     Component Value Date/Time   CHOL 73* 02/11/2015 0958   TRIG 225* 02/11/2015 0958   HDL 27* 02/11/2015 0958   CHOLHDL 2.7 02/11/2015 0958   VLDL 45* 02/11/2015 0958   LDLCALC 1 02/11/2015 0958    RADIOLOGY: No results found.   ASSESSMENT AND PLAN: [name] is a 50 year old gentleman who has a long-standing history of tobacco use and continues to smoke cigarettes.  He underwent initial defibrillator insertion in 2947 for systolic heart failure and in 2014 underwent an ICD upgrade generator change.  His last echo Doppler study was done by [facility] in 2015 which showed an ejection fraction at a  ~ 45% with LVH and grade 1 diastolic dysfunction.  His blood pressure today is controlled on carvedilol 25 mg twice a day lisinopril 40 mg in addition to his as needed furosemide.  He continues to be on digoxin at 125 g.  I reviewed his recent laboratory.  He is continues to have high triglyceride levels and low HDL.  Recently, the Vascepa 2 capsules twice a day was added to his medical regimen.  He has not had follow-up blood work done and I will check a CMet, and NMR LipoProfile, BNP , digoxin level and CBC.  I suspect he will have small LDL particles. A review recent and prior laboratory also indicates a consistently elevated hemoglobin and I suspect he may have a component of polycythemia secondary to his chronic smoking.  He also had mild elevation of liver transaminase.   His history is also highly suggestive of obstructive sleep apnea.  I am recommending a sleep study be obtained for re-valuation and probable initiation of CPAP therapy if needed.  At present, there are no signs of overt CHF.  I will contact him regarding the results of his blood work and adjustments to his medications will be made.  Otherwise, I will see him in 3 months for reevaluation.  Time spent: 40 minutes   Troy Sine, MD, [facility] 05/08/2015 2:55 PM

## 2015-05-11 ENCOUNTER — Ambulatory Visit: Payer: 59 | Admitting: Internal Medicine

## 2015-05-24 ENCOUNTER — Ambulatory Visit (INDEPENDENT_AMBULATORY_CARE_PROVIDER_SITE_OTHER): Payer: 59 | Admitting: Internal Medicine

## 2015-05-24 ENCOUNTER — Encounter: Payer: Self-pay | Admitting: Internal Medicine

## 2015-05-24 VITALS — BP 132/84 | HR 71 | Ht 68.0 in | Wt 198.4 lb

## 2015-05-24 DIAGNOSIS — I429 Cardiomyopathy, unspecified: Secondary | ICD-10-CM | POA: Diagnosis not present

## 2015-05-24 DIAGNOSIS — I428 Other cardiomyopathies: Secondary | ICD-10-CM

## 2015-05-24 LAB — CUP PACEART INCLINIC DEVICE CHECK
Battery Remaining Longevity: 126 mo
Brady Statistic RV Percent Paced: 1 % — CL
Date Time Interrogation Session: 20170509040000
HighPow Impedance: 50 Ohm
HighPow Impedance: 62 Ohm
Implantable Lead Implant Date: 20060117
Implantable Lead Location: 753860
Implantable Lead Model: 185
Implantable Lead Serial Number: 124785
Lead Channel Pacing Threshold Amplitude: 1.3 V
Lead Channel Sensing Intrinsic Amplitude: 11.3 mV
Lead Channel Setting Pacing Pulse Width: 0.4 ms
MDC IDC MSMT LEADCHNL RV IMPEDANCE VALUE: 493 Ohm
MDC IDC MSMT LEADCHNL RV PACING THRESHOLD PULSEWIDTH: 0.4 ms
MDC IDC PG SERIAL: 126350
MDC IDC SET LEADCHNL RV PACING AMPLITUDE: 2.6 V
MDC IDC SET LEADCHNL RV SENSING SENSITIVITY: 0.6 mV

## 2015-05-24 NOTE — Patient Instructions (Signed)
Medication Instructions:  Your physician recommends that you continue on your current medications as directed. Please refer to the Current Medication list given to you today.   Labwork: None ordered   Testing/Procedures: None ordered   Follow-Up: Your physician wants you to follow-up in: 12 months with Dr Knox Saliva will receive a reminder letter in the mail two months in advance. If you don't receive a letter, please call our office to schedule the follow-up appointment.   Remote monitoring is used to monitor your  ICD from home. This monitoring reduces the number of office visits required to check your device to one time per year. It allows [place] to keep an eye on the functioning of your device to ensure it is working properly. You are scheduled for a device check from home on 08/23/15. You may send your transmission at any time that day. If you have a wireless device, the transmission will be sent automatically. After your physician reviews your transmission, you will receive a postcard with your next transmission date.    Any Other Special Instructions Will Be Listed Below (If Applicable).     If you need a refill on your cardiac medications before your next appointment, please call your pharmacy.

## 2015-05-24 NOTE — Progress Notes (Signed)
HPI Justin Ray returns today for followup. He is a very pleasant 50 year old man with chronic systolic heart failure, class II, a nonischemic cardiomyopathy, status post ICD implantation. In the interim, he has done well. He continues to work full-time and denies chest pain or shortness of breath. He has not had palpitations. No syncope. No edema. He is trying to stop smoking. He notes that he has palpitations back in the winter which have improved. Allergies  Allergen Reactions  . Codeine Other (See Comments)    Severe HA's.  . Niaspan [Niacin Er] Other (See Comments)    Per pt feels like skin is burning( REDNESS), and pins/needles are sticking   . Sulfonamide Derivatives Other (See Comments)    thrush     Current Outpatient Prescriptions  Medication Sig Dispense Refill  . ALPRAZolam (XANAX) 1 MG tablet Take 1 tablet (1 mg total) by mouth 2 (two) times daily as needed for anxiety (for anxiety). 60 tablet 5  . aspirin 81 MG tablet Take 81 mg by mouth daily.     . blood glucose meter kit and supplies KIT Test blood sugar 3 times daily. Dx code: E11.65 1 each 0  . carvedilol (COREG) 25 MG tablet take 1 tablet by mouth twice a day with A MEAL 180 tablet 1  . desvenlafaxine (PRISTIQ) 100 MG 24 hr tablet TAKE 1 TABLET BY MOUTH ONCE DAILY. 90 tablet 3  . DIGOX 125 MCG tablet take 1 tablet by mouth once daily 90 tablet 1  . furosemide (LASIX) 40 MG tablet Take 1 tablet (40 mg total) by mouth as needed. 30 tablet 6  . glucose blood test strip Test blood sugar 3 times daily. Dx code: E11.65 300 each 3  . insulin NPH-regular Human (HUMULIN 70/30) (70-30) 100 UNIT/ML injection inject 65 units subcutaneously every morning and inject 50-55 units subcutaneously every evening 40 vial 11  . Insulin Syringe-Needle U-100 (BD INSULIN SYRINGE ULTRAFINE) 31G X 15/64" 1 ML MISC USE AS DIRECTED TO INJECT INSULIN TWICE A DAY. 100 each 6  . Lancets MISC Test blood sugar 3 times daily. Dx code: E11.65 300 each 3   . lisinopril (PRINIVIL,ZESTRIL) 20 MG tablet Take 1 tablet (20 mg total) by mouth 2 (two) times daily. 180 tablet 0  . nicotine (EQ NICOTINE) 14 mg/24hr patch Place 1 patch (14 mg total) onto the skin daily. 28 patch 0  . nicotine (EQ NICOTINE) 21 mg/24hr patch Place 1 patch (21 mg total) onto the skin daily. 28 patch 0  . nicotine (EQ NICOTINE) 7 mg/24hr patch Place 1 patch (7 mg total) onto the skin daily. 28 patch 0  . rosuvastatin (CRESTOR) 20 MG tablet Take 20 mg by mouth daily.  0  . triazolam (HALCION) 0.25 MG tablet Take 1 tablet (0.25 mg total) by mouth at bedtime as needed for sleep. 30 tablet 5  . VASCEPA 1 g CAPS      No current facility-administered medications for this visit.     Past Medical History  Diagnosis Date  . CHF (congestive heart failure) ([place])   . Cardiomyopathy   . Dyslipidemia   . Diabetes mellitus   . Anxiety   . Hypertension   . Cataract   . Wears glasses   . Sleep apnea     was tested many yr ago-does not use a cpap-  . ICD (implantable cardioverter-defibrillator) in place   . Carpal tunnel syndrome 10/14    right-GSC    ROS:   All systems  reviewed and negative except as noted in the HPI.   Past Surgical History  Procedure Laterality Date  . Cardiac defibrillator placement  02/01/2004  . Tonsillectomy    . Eye surgery    . Cardiac defibrillator placement  11/14    replaced with new one  . Carpal tunnel release Left 12/19/2012    Procedure: LEFT CARPAL TUNNEL RELEASE;  Surgeon: Roseanne Kaufman, MD;  Location: [place];  Service: Orthopedics;  Laterality: Left;  . Implantable cardioverter defibrillator (icd) generator change N/A 11/21/2012    Procedure: ICD GENERATOR CHANGE;  Surgeon: Evans Lance, MD;  Location: [facility] CATH LAB;  Service: Cardiovascular;  Laterality: N/A;     Family History  Problem Relation Age of Onset  . Cancer    . Coronary artery disease Father     CABG  . Cancer Mother     colon  . Heart disease  Brother      Social History   Social History  . Marital Status: Widowed    Spouse Name: N/A  . Number of Children: 0  . Years of Education: N/A   Occupational History  . maintenance Unemployed   Social History Main Topics  . Smoking status: Current Every Day Smoker -- 1.50 packs/day    Types: Cigarettes  . Smokeless tobacco: Not on file  . Alcohol Use: No  . Drug Use: No  . Sexual Activity: No     Comment: number of sex partners in the last 12 months  0   Other Topics Concern  . Not on file   Social History Narrative     BP 132/84 mmHg  Pulse 71  Ht '5\' 8"'$  (1.727 m)  Wt 198 lb 6.4 oz (89.994 kg)  BMI 30.17 kg/m2  Physical Exam:  Well appearing 50 year old man, NAD HEENT: Unremarkable Neck:  6 cm JVD, no thyromegally Lungs:  Clear with no wheezes, rales, or rhonchi. HEART:  Regular rate rhythm, no murmurs, no rubs, no clicks Abd:  soft, positive bowel sounds, no organomegally, no rebound, no guarding Ext:  2 plus pulses, no edema, no cyanosis, no clubbing Skin:  No rashes no nodules Neuro:  CN II through XII intact, motor grossly intact  ECG - nsr  DEVICE  Normal device function.  See PaceArt for details.   Assess/Plan:  1. Chronic systolic heart failure - his symptoms are class 2. He is encouraged to increase his physical activity and maintain a low sodium diet. 2. Tobacco abuse - we discussed the importance of stopping. He is taking the nicotine patches 3. ICD - his [employer] device is working normally. Will recheck in several months. 4. Dyslipidemia - he will continue lipitor.  Justin Ray.D.

## 2015-05-25 LAB — COMPREHENSIVE METABOLIC PANEL
ALBUMIN: 4.3 g/dL (ref 3.6–5.1)
ALT: 42 U/L (ref 9–46)
AST: 31 U/L (ref 10–35)
Alkaline Phosphatase: 59 U/L (ref 40–115)
BILIRUBIN TOTAL: 0.7 mg/dL (ref 0.2–1.2)
BUN: 16 mg/dL (ref 7–25)
CO2: 25 mmol/L (ref 20–31)
CREATININE: 0.78 mg/dL (ref 0.70–1.33)
Calcium: 8.7 mg/dL (ref 8.6–10.3)
Chloride: 107 mmol/L (ref 98–110)
Glucose, Bld: 93 mg/dL (ref 65–99)
Potassium: 4.5 mmol/L (ref 3.5–5.3)
SODIUM: 140 mmol/L (ref 135–146)
TOTAL PROTEIN: 6.9 g/dL (ref 6.1–8.1)

## 2015-05-25 LAB — CBC
HCT: 52 % — ABNORMAL HIGH (ref 38.5–50.0)
Hemoglobin: 17.9 g/dL — ABNORMAL HIGH (ref 13.2–17.1)
MCH: 30.8 pg (ref 27.0–33.0)
MCHC: 34.4 g/dL (ref 32.0–36.0)
MCV: 89.5 fL (ref 80.0–100.0)
MPV: 9.9 fL (ref 7.5–12.5)
PLATELETS: 160 10*3/uL (ref 140–400)
RBC: 5.81 MIL/uL — ABNORMAL HIGH (ref 4.20–5.80)
RDW: 13.8 % (ref 11.0–15.0)
WBC: 8.1 10*3/uL (ref 3.8–10.8)

## 2015-05-25 LAB — BRAIN NATRIURETIC PEPTIDE: Brain Natriuretic Peptide: 11.9 pg/mL (ref <100)

## 2015-05-25 LAB — DIGOXIN LEVEL

## 2015-06-01 LAB — CARDIO IQ ADV LIPID AND INFLAMM PNL
APOLIPOPROTEIN (CARDIO IQ ADV LIPID PANEL): 39 mg/dL — AB (ref 52–109)
CHOLESTEROL, TOTAL (CARDIO IQ ADV LIPID PANEL): 83 mg/dL — AB (ref 125–200)
Cholesterol/HDL Ratio: 2.6 calc (ref <=5.0)
HDL CHOLESTEROL (CARDIO IQ ADV LIPID PANEL): 32 mg/dL — AB (ref >=40)
LDL LARGE: 3336 nmol/L — AB (ref 4334–10815)
LDL MEDIUM: 47 nmol/L — AB (ref 167–465)
LDL PEAK SIZE: 206.3 Angstrom — AB (ref >=218.2)
LDL Particle Number: 435 nmol/L — ABNORMAL LOW (ref 1016–2185)
LDL Small: 81 nmol/L — ABNORMAL LOW (ref 123–441)
LDL, Calculated: 15 mg/dL
Lp-PLA2 Activity: 74 nmol/min/mL (ref 70–153)
Non-HDL Cholesterol: 51 mg/dL
Triglycerides: 181 mg/dL — ABNORMAL HIGH

## 2015-06-22 ENCOUNTER — Other Ambulatory Visit: Payer: Self-pay | Admitting: *Deleted

## 2015-06-22 MED ORDER — LISINOPRIL 20 MG PO TABS: 20.0000 mg | ORAL_TABLET | Freq: Two times a day (BID) | ORAL | Status: DC

## 2015-06-22 MED ORDER — CARVEDILOL 25 MG PO TABS: ORAL_TABLET | ORAL | Status: DC

## 2015-06-29 ENCOUNTER — Ambulatory Visit (HOSPITAL_BASED_OUTPATIENT_CLINIC_OR_DEPARTMENT_OTHER): Payer: 59 | Attending: Cardiovascular Disease | Admitting: Cardiovascular Disease

## 2015-06-29 DIAGNOSIS — G4736 Sleep related hypoventilation in conditions classified elsewhere: Secondary | ICD-10-CM | POA: Diagnosis not present

## 2015-06-29 DIAGNOSIS — G4719 Other hypersomnia: Secondary | ICD-10-CM | POA: Insufficient documentation

## 2015-06-29 DIAGNOSIS — Z7982 Long term (current) use of aspirin: Secondary | ICD-10-CM | POA: Diagnosis not present

## 2015-06-29 DIAGNOSIS — G4733 Obstructive sleep apnea (adult) (pediatric): Secondary | ICD-10-CM | POA: Insufficient documentation

## 2015-06-29 DIAGNOSIS — R51 Headache: Secondary | ICD-10-CM | POA: Insufficient documentation

## 2015-06-29 DIAGNOSIS — Z794 Long term (current) use of insulin: Secondary | ICD-10-CM | POA: Diagnosis not present

## 2015-06-29 DIAGNOSIS — R5383 Other fatigue: Secondary | ICD-10-CM | POA: Diagnosis not present

## 2015-06-29 DIAGNOSIS — I493 Ventricular premature depolarization: Secondary | ICD-10-CM | POA: Diagnosis not present

## 2015-06-29 DIAGNOSIS — I10 Essential (primary) hypertension: Secondary | ICD-10-CM | POA: Diagnosis not present

## 2015-06-29 DIAGNOSIS — R0683 Snoring: Secondary | ICD-10-CM | POA: Diagnosis not present

## 2015-06-29 DIAGNOSIS — E119 Type 2 diabetes mellitus without complications: Secondary | ICD-10-CM | POA: Insufficient documentation

## 2015-06-29 DIAGNOSIS — Z79899 Other long term (current) drug therapy: Secondary | ICD-10-CM | POA: Insufficient documentation

## 2015-07-01 ENCOUNTER — Encounter (HOSPITAL_BASED_OUTPATIENT_CLINIC_OR_DEPARTMENT_OTHER): Payer: Self-pay | Admitting: Cardiovascular Disease

## 2015-07-01 ENCOUNTER — Other Ambulatory Visit: Payer: Self-pay | Admitting: *Deleted

## 2015-07-01 MED ORDER — DIGOXIN 125 MCG PO TABS: 125.0000 ug | ORAL_TABLET | Freq: Every day | ORAL | Status: DC

## 2015-07-01 NOTE — Procedures (Signed)
Patient Name: Justin Ray, Justin Ray Date: 06/29/2015 Gender: Male D.O.B: 1965-12-19 Age (years): 47 Referring Provider: [name] MD, ABSM Height (inches): 68 Interpreting Physician: [name] MD, ABSM Weight (lbs): 194 RPSGT: Carolin Coy BMI: 29 MRN: [id number] Neck Size: 17.50 CLINICAL INFORMATION Sleep Study Type: NPSG Indication for sleep study: Diabetes, Excessive Daytime Sleepiness, Fatigue, Hypertension, Morning Headaches, OSA Epworth Sleepiness Score: 10  SLEEP STUDY TECHNIQUE As per the AASM Manual for the Scoring of Sleep and Associated Events v2.3 (April 2016) with a hypopnea requiring 4% desaturations. The channels recorded and monitored were frontal, central and occipital EEG, electrooculogram (EOG), submentalis EMG (chin), nasal and oral airflow, thoracic and abdominal wall motion, anterior tibialis EMG, snore microphone, electrocardiogram, and pulse oximetry.  MEDICATIONS  VASCEPA 1 g CAPS      triazolam (HALCION) 0.25 MG tablet 0.25 mg, At bedtime PRN     rosuvastatin (CRESTOR) 20 MG tablet 20 mg, Daily     nicotine (EQ NICOTINE) 7 mg/24hr patch 7 mg, Daily     nicotine (EQ NICOTINE) 21 mg/24hr patch 21 mg, Daily     nicotine (EQ NICOTINE) 14 mg/24hr patch 14 mg, Daily     lisinopril (PRINIVIL,ZESTRIL) 20 MG tablet 20 mg, 2 times daily     Lancets MISC      Insulin Syringe-Needle U-100 (BD INSULIN SYRINGE ULTRAFINE) 31G X 15/64" 1 ML MISC      insulin NPH-regular Human (HUMULIN 70/30) (70-30) 100 UNIT/ML injection      glucose blood test strip      furosemide (LASIX) 40 MG tablet 40 mg, As needed     digoxin (DIGOX) 0.125 MG tablet 125 mcg, Daily     desvenlafaxine (PRISTIQ) 100 MG 24 hr tablet      carvedilol (COREG) 25 MG tablet      blood glucose meter kit and supplies KIT      aspirin 81 MG tablet 81 mg, Daily     ALPRAZolam (XANAX) 1 MG tablet   Medications self-administered by patient during sleep study : No sleep medicine  administered.  SLEEP ARCHITECTURE The study was initiated at 10:01:46 PM and ended at 4:32:52 AM. Sleep onset time was 74.7 minutes and the sleep efficiency was 60.1%. The total sleep time was 235.0 minutes. Wake after sleep onset (WASO) was 81.4 miniutes. Stage REM latency was 98.5 minutes. The patient spent 22.98% of the night in stage N1 sleep, 69.79% in stage N2 sleep, 0.00% in stage N3 and 7.23% in REM. Alpha intrusion was absent. Supine sleep was 0.00%.  RESPIRATORY PARAMETERS The overall apnea/hypopnea index (AHI) was 2.6 per hour. There were 0 total apneas, including 0 obstructive, 0 central and 0 mixed apneas. There were 10 hypopneas and 44 RERAs. The AHI during Stage REM sleep was 0.0 per hour. AHI while supine was N/A per hour. The mean oxygen saturation was 88.47%. The minimum SpO2 during sleep was 85.00%. Soft snoring was noted during this study.  CARDIAC DATA The 2 lead EKG demonstrated sinus rhythm. The mean heart rate was 80.58 beats per minute. Other EKG findings include: PVCs.  LEG MOVEMENT DATA The total PLMS were 135 with a resulting PLMS index of 34.47. Associated arousal with leg movement index was 4.6.  IMPRESSIONS - No significant obstructive sleep apnea occurred during this study (AHI = 2.6/h); supine sleep was not acheieved. - No significant central sleep apnea occurred during this study (CAI = 0.0/h). - Mild oxygen desaturation to a nadir of 8 5.00% during NREM  sleep and 87.1% during REM sleep. - Reduction in REM sleep. - The patient snored with Soft snoring volume. - EKG findings include PVCs. - Moderate periodic limb movements of sleep occurred during the study. No significant associated arousals.  DIAGNOSIS - Nocturnal Hypoxemia (327.26 [G47.36 ICD-10])  RECOMMENDATIONS - Significant sleep apnea was not demonstrated (although there was no supine sleep); no need for CPAP  therapy. - Efforts should be made to optimize nasal and oropharyngeal  patency. - Consider alternatives for the treatment of mild snoring.  - Avoid alcohol, sedatives and other CNS depressants that may worsen sleep apnea and disrupt normal sleep architecture. - Sleep hygiene should be reviewed to assess factors that may improve sleep quality. - Weight management and regular exercise should be initiated or continued if appropriate.  Troy Sine, MD, [place], American Board of Sleep Medicine  ELECTRONICALLY SIGNED ON:  07/01/2015, 8:08 PM [place] PH: [phone]   FX: [phone] [place]

## 2015-07-05 ENCOUNTER — Telehealth: Payer: Self-pay | Admitting: *Deleted

## 2015-07-05 NOTE — Telephone Encounter (Signed)
Called patient and informed him of sleep study results.

## 2015-07-05 NOTE — Progress Notes (Signed)
Called and notified patient of these results.

## 2015-07-14 ENCOUNTER — Other Ambulatory Visit: Payer: Self-pay

## 2015-07-14 MED ORDER — "INSULIN SYRINGE-NEEDLE U-100 31G X 15/64"" 1 ML MISC": Status: DC

## 2015-07-14 NOTE — Telephone Encounter (Signed)
fax from [facility]-BD ins syr UF 3ml 31x52mm #100 refil x 2

## 2015-08-02 ENCOUNTER — Other Ambulatory Visit: Payer: Self-pay

## 2015-08-02 MED ORDER — VASCEPA 1 G PO CAPS: 2.0000 | ORAL_CAPSULE | Freq: Two times a day (BID) | ORAL | Status: DC

## 2015-08-02 NOTE — Telephone Encounter (Signed)
Rx(s) sent to pharmacy electronically.  

## 2015-08-19 ENCOUNTER — Telehealth: Payer: Self-pay | Admitting: Cardiovascular Disease

## 2015-08-22 ENCOUNTER — Ambulatory Visit: Payer: Self-pay | Admitting: Cardiovascular Disease

## 2015-08-23 ENCOUNTER — Ambulatory Visit (INDEPENDENT_AMBULATORY_CARE_PROVIDER_SITE_OTHER): Payer: 59 | Admitting: *Deleted

## 2015-08-23 DIAGNOSIS — Z95810 Presence of automatic (implantable) cardiac defibrillator: Secondary | ICD-10-CM

## 2015-08-23 DIAGNOSIS — I428 Other cardiomyopathies: Secondary | ICD-10-CM

## 2015-08-23 DIAGNOSIS — I429 Cardiomyopathy, unspecified: Secondary | ICD-10-CM

## 2015-08-23 NOTE — Progress Notes (Signed)
Remote ICD transmission.   

## 2015-08-24 ENCOUNTER — Encounter: Payer: Self-pay | Admitting: Cardiovascular Disease

## 2015-08-24 ENCOUNTER — Encounter: Payer: Self-pay | Admitting: Cardiology

## 2015-08-24 ENCOUNTER — Ambulatory Visit (INDEPENDENT_AMBULATORY_CARE_PROVIDER_SITE_OTHER): Payer: 59 | Admitting: Cardiovascular Disease

## 2015-08-24 VITALS — BP 126/84 | HR 74 | Ht 68.0 in | Wt 198.8 lb

## 2015-08-24 DIAGNOSIS — E782 Mixed hyperlipidemia: Secondary | ICD-10-CM

## 2015-08-24 DIAGNOSIS — I5042 Chronic combined systolic (congestive) and diastolic (congestive) heart failure: Secondary | ICD-10-CM

## 2015-08-24 DIAGNOSIS — I429 Cardiomyopathy, unspecified: Secondary | ICD-10-CM

## 2015-08-24 DIAGNOSIS — G478 Other sleep disorders: Secondary | ICD-10-CM

## 2015-08-24 DIAGNOSIS — Z95810 Presence of automatic (implantable) cardiac defibrillator: Secondary | ICD-10-CM

## 2015-08-24 DIAGNOSIS — I428 Other cardiomyopathies: Secondary | ICD-10-CM

## 2015-08-24 NOTE — Patient Instructions (Signed)
Your physician wants you to follow-up in: 6 months or sooner if needed. You will receive a reminder letter in the mail two months in advance. If you don't receive a letter, please call our office to schedule the follow-up appointment.   If you need a refill on your cardiac medications before your next appointment, please call your pharmacy. 

## 2015-08-26 DIAGNOSIS — E782 Mixed hyperlipidemia: Secondary | ICD-10-CM | POA: Insufficient documentation

## 2015-08-26 DIAGNOSIS — G478 Other sleep disorders: Secondary | ICD-10-CM | POA: Insufficient documentation

## 2015-08-26 NOTE — Telephone Encounter (Signed)
Closed encounter °

## 2015-08-26 NOTE — Progress Notes (Signed)
Patient ID: Justin Ray, male   DOB: 1965/09/07, 50 y.o.   MRN: [id number]     Primary MD: Dr. Tami Lin  PATIENT PROFILE: Justin Ray is a 50 y.o. male who is referred through the courtesy of Dr. Laney Pastor cardiology care with me.  The patient had been previously followed in [place] for his cardiology care has also seen Dr. Lovena Le who follows him from an electrophysiologic standpoint with his history of cardiomyopathy and ICD implantation.  He presents to sleep clinic today in follow-up of his recent sleep study.   HPI:  Justin Ray has history of chronic systolic heart failure, class II and is felt to have a nonischemic cardiomyopathy.  He underwent insertion of an ICD in January 2006 and in November 2014 underwent ICD generator change by Dr.Taylor.  He has a history of side a/depression, type 2 diabetes mellitus on insulin, hyperlipidemia, and has continued have difficulty with elevated triglycerides.  He had been followed by the cardiology group in [place], but has developed recent discontent with his physician and is requested a new cardiology evaluation.  The patient has been on lisinopril 20 mg twice a day, digoxin 0.125 mg and carvedilol 25 mg twice a day for his coronary myopathy.  Hypertension.  He has been on Crestor 20 mg for hyperlipidemia.  An earlier this year he was started on placebo 2 capsules twice a day due to persistent elevated triglyceride levels.  His last lipid study 2 months ago showed a total cholesterol of 73, triglycerides 225, HDL 27, and EDL 45.  I suspect his LDL is inaccurate and this was recorded as 1.  He has a history of continued tobacco use, and started smoking at age 28 and currently is trying to quit but is still smoking.  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  He does not routinely exercise.   In 2004 he apparently underwent a sleep study and was told of having obstructive sleep apnea but was  never started on CPAP therapy.  Presently, he goes to bed about 1 AM and wakes up at 8 AM.  He often is up at least 2-4 times per night.  His sleep is nonrestorative.  He snores loudly.  He does note daytime sleepiness.  He is diabetic on insulin.  He has been taking Xanax as needed for anxiety.    Since I initially saw him in April 2017.  He underwent a sleep study at the [place].  This was done on 06/29/2015.  This study was consistent with upper airway resistance syndrome with an AHI of 2.6 per hour.  His mean oxygen saturation was 88.47% with a minimum oxygen desaturation to 85%.  There was evidence for soft snoring.  Of note, he was never able to achieve any supine sleep.  He presents for reevaluation.  An Epworth Sleepiness Scale score was calculated today in the office and this endorsed at 9 with a moderate chance of dozing sitting and reading, watching TV, and while lying down to rest in the afternoon and circumstances persist.  He had a slight chance of dozing dozing while sitting inactive in a public place, as a passenger in a car, and sitting quietly after lunch.  He presents for evaluation.  Past Medical History:  Diagnosis Date  . Anxiety   . Cardiomyopathy   . Carpal tunnel syndrome 10/14   right-GSC  . Cataract   . CHF (congestive heart failure) ([place])   .  Diabetes mellitus   . Dyslipidemia   . Hypertension   . ICD (implantable cardioverter-defibrillator) in place   . Sleep apnea    was tested many yr ago-does not use a cpap-  . Wears glasses     Past Surgical History:  Procedure Laterality Date  . CARDIAC DEFIBRILLATOR PLACEMENT  02/01/2004  . CARDIAC DEFIBRILLATOR PLACEMENT  11/14   replaced with new one  . CARPAL TUNNEL RELEASE Left 12/19/2012   Procedure: LEFT CARPAL TUNNEL RELEASE;  Surgeon: Roseanne Kaufman, MD;  Location: [place];  Service: Orthopedics;  Laterality: Left;  . EYE SURGERY    . IMPLANTABLE CARDIOVERTER DEFIBRILLATOR  (ICD) GENERATOR CHANGE N/A 11/21/2012   Procedure: ICD GENERATOR CHANGE;  Surgeon: Evans Lance, MD;  Location: [facility] CATH LAB;  Service: Cardiovascular;  Laterality: N/A;  . TONSILLECTOMY      Allergies  Allergen Reactions  . Codeine Other (See Comments)    Severe HA's.  . Niaspan [Niacin Er] Other (See Comments)    Per pt feels like skin is burning( REDNESS), and pins/needles are sticking   . Sulfonamide Derivatives Other (See Comments)    thrush    Current Outpatient Prescriptions  Medication Sig Dispense Refill  . ALPRAZolam (XANAX) 1 MG tablet Take 1 tablet (1 mg total) by mouth 2 (two) times daily as needed for anxiety (for anxiety). 60 tablet 5  . aspirin 81 MG tablet Take 81 mg by mouth daily.     . blood glucose meter kit and supplies KIT Test blood sugar 3 times daily. Dx code: E11.65 1 each 0  . carvedilol (COREG) 25 MG tablet take 1 tablet by mouth twice a day with A MEAL 180 tablet 3  . desvenlafaxine (PRISTIQ) 100 MG 24 hr tablet TAKE 1 TABLET BY MOUTH ONCE DAILY. 90 tablet 3  . digoxin (DIGOX) 0.125 MG tablet Take 1 tablet (125 mcg total) by mouth daily. 90 tablet 3  . furosemide (LASIX) 40 MG tablet Take 1 tablet (40 mg total) by mouth as needed. 30 tablet 6  . insulin NPH-regular Human (HUMULIN 70/30) (70-30) 100 UNIT/ML injection inject 65 units subcutaneously every morning and inject 50-55 units subcutaneously every evening 40 vial 11  . lisinopril (PRINIVIL,ZESTRIL) 20 MG tablet Take 1 tablet (20 mg total) by mouth 2 (two) times daily. 180 tablet 3  . rosuvastatin (CRESTOR) 20 MG tablet Take 20 mg by mouth daily.  0  . triazolam (HALCION) 0.25 MG tablet Take 1 tablet (0.25 mg total) by mouth at bedtime as needed for sleep. 30 tablet 5  . VASCEPA 1 g CAPS Take 2 capsules by mouth 2 (two) times daily. 120 capsule 11   No current facility-administered medications for this visit.     Social History   Social History  . Marital status: Widowed    Spouse name: N/A  .  Number of children: 0  . Years of education: N/A   Occupational History  . maintenance Unemployed   Social History Main Topics  . Smoking status: Current Every Day Smoker    Packs/day: 1.50    Types: Cigarettes  . Smokeless tobacco: Not on file  . Alcohol use No  . Drug use: No  . Sexual activity: No     Comment: number of sex partners in the last 12 months  0   Other Topics Concern  . Not on file   Social History Narrative  . No narrative on file   Additional social history is notable  that he is widowed for 8 years.  He lives by himself.  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  He completed 12th grade of education.  He continues to smoke cigarettes and has been smoking since age 62.  He does drink occasional beer.  He walks intermittently outside work.  Family History  Problem Relation Age of Onset  . Coronary artery disease Father     CABG  . Cancer Mother     colon  . Heart disease Brother   . Cancer     Additional family history is that his mother died at 27 secondary to cancer.  His father is deceased and had undergone CABG revascularization surgery.  His 2 brothers, ages 42 and 7.  ROS General: Negative; No fevers, chills, or night sweats HEENT: Negative; No changes in vision or hearing, sinus congestion, difficulty swallowing Pulmonary: Negative; No cough, wheezing, shortness of breath, hemoptysis Cardiovascular:  See HPI; No chest pain, presyncope, syncope, palpitations, edema GI: Negative; No nausea, vomiting, diarrhea, or abdominal pain GU: Negative; No dysuria, hematuria, or difficulty voiding Musculoskeletal: Negative; no myalgias, joint pain, or weakness Hematologic/Oncologic: Negative; no easy bruising, bleeding Endocrine: Negative; no heat/cold intolerance; no diabetes Neuro: Negative; no changes in balance, headaches Skin: Negative; No rashes or skin lesions Psychiatric: Positive for anxiety and depression Sleep: Positive for  poor sleep, snoring, nonrestorative sleep, and daytime sleepiness.; No  bruxism, restless legs, hypnogagnic hallucinations Other comprehensive 14 point system review is negative   Physical Exam BP 126/84   Pulse 74   Ht '5\' 8"'$  (1.727 m)   Wt 198 lb 12.8 oz (90.2 kg)   SpO2 98%   BMI 30.23 kg/m   Wt Readings from Last 3 Encounters:  08/24/15 198 lb 12.8 oz (90.2 kg)  06/29/15 194 lb (88 kg)  05/24/15 198 lb 6.4 oz (90 kg)   General: Alert, oriented, no distress.  Skin: normal turgor, no rashes, warm and dry HEENT: Normocephalic, atraumatic. Pupils equal round and reactive to light; sclera anicteric; extraocular muscles intact; Fundi Mild arteriolar narrowing.  No hemorrhages or exudates Nose without nasal septal hypertrophy Mouth/Parynx benign; Mallinpatti scale 3 Neck: No JVD, no carotid bruits; normal carotid upstroke Lungs: clear to ausculatation and percussion; no wheezing or rales Chest wall: without tenderness to palpitation Heart: PMI not displaced, RRR, s1 s2 normal, 1/6 systolic murmur, no diastolic murmur, no rubs, gallops, thrills, or heaves Abdomen: soft, nontender; no hepatosplenomehaly, BS+; abdominal aorta nontender and not dilated by palpation. Back: no CVA tenderness Pulses 2+ Musculoskeletal: full range of motion, normal strength, no joint deformities Extremities: no clubbing cyanosis or edema, Homan's sign negative  Neurologic: grossly nonfocal; Cranial nerves grossly wnl Psychologic: Normal mood and affect   April 2017 ECG (independently read by me): Normal sinus rhythm at 73 bpm.  Borderline voltage criteria for LVH.  LABS:  BMP Latest Ref Rng & Units 05/24/2015 02/11/2015 06/23/2014  Glucose 65 - 99 mg/dL 93 163(H) 137(H)  BUN 7 - 25 mg/dL '16 15 14  '$ Creatinine 0.70 - 1.33 mg/dL 0.78 0.84 0.81  Sodium 135 - 146 mmol/L 140 140 141  Potassium 3.5 - 5.3 mmol/L 4.5 4.3 4.1  Chloride 98 - 110 mmol/L 107 105 109  CO2 20 - 31 mmol/L '25 25 22  '$ Calcium 8.6 - 10.3  mg/dL 8.7 8.7 8.9     Hepatic Function Latest Ref Rng & Units 05/24/2015 02/11/2015 06/23/2014  Total Protein 6.1 - 8.1 g/dL 6.9 6.6 6.6  Albumin  3.6 - 5.1 g/dL 4.3 4.1 4.1  AST 10 - 35 U/L '31 31 31  '$ ALT 9 - 46 U/L 42 51(H) 53  Alk Phosphatase 40 - 115 U/L 59 68 58  Total Bilirubin 0.2 - 1.2 mg/dL 0.7 0.6 0.5    CBC Latest Ref Rng & Units 05/24/2015 02/11/2015 06/23/2014  WBC 3.8 - 10.8 K/uL 8.1 6.6 6.4  Hemoglobin 13.2 - 17.1 g/dL 17.9(H) 17.2(H) 17.5(H)  Hematocrit 38.5 - 50.0 % 52.0(H) 48.9 50.3  Platelets 140 - 400 K/uL 160 138(L) 162   Lab Results  Component Value Date   MCV 89.5 05/24/2015   MCV 90.7 02/11/2015   MCV 89.8 06/23/2014   Lab Results  Component Value Date   TSH 0.800 09/05/2011   Lab Results  Component Value Date   HGBA1C 7.5 05/04/2015     BNP    Component Value Date/Time   BNP 11.9 05/24/2015 1036    ProBNP No results found for: PROBNP   Lipid Panel     Component Value Date/Time   CHOL 83 (L) 05/06/2015 1723   TRIG 181 (H) 05/06/2015 1723   HDL 32 (L) 05/06/2015 1723   CHOLHDL 2.6 05/06/2015 1723   CHOLHDL 2.7 02/11/2015 0958   VLDL 45 (H) 02/11/2015 0958   LDLCALC 15 05/06/2015 1723    RADIOLOGY: No results found.   ASSESSMENT AND PLAN: Mr. Jozeph Persing is a 50 year old gentleman who has a long-standing history of tobacco use and continues to smoke cigarettes.  He underwent initial defibrillator insertion in 9030 for systolic heart failure and in 2014 underwent an ICD upgrade generator change.  His last echo Doppler study was done by [facility] in 2015 which showed an ejection fraction at a  ~ 45% with LVH and grade 1 diastolic dysfunction.  Presently his blood pressure is controlled on carvedilol 25 mg twice a day, lisinopril 40 mg in addition to his as needed furosemide.  He continues to be on digoxin at 125 g. He has a history of mixed hyperlipidemia and is now on Vascepa 2 capsules twice a day in addition to Crestor 20 mg daily.   I reviewed his most recent sleep study with him in detail.  On the present study.  He did not meet criteria for CPAP initiation.  His overall AHI was 2.6, with absence of supine sleep.  He did have reduced oxygen saturation with a nadir of 85%.  I again discussed the importance of smoking cessation, which may be contributing to his oxygen desaturation.  He does not sleep on his back.  It is possible that if he did have supine sleep his AHI would be consistent with sleep apnea but this was not demonstrated on his most recent sleep study.  We discussed continued positional therapy and avoidance of supine sleep.  I will see him in 6 months for follow-up cardiology evaluation.  Time spent: 25 minutes   Troy Sine, MD, [facility] 08/26/2015 11:02 PM

## 2015-08-31 LAB — CUP PACEART REMOTE DEVICE CHECK
Battery Remaining Longevity: 120 mo
Brady Statistic RV Percent Paced: 0 %
HIGH POWER IMPEDANCE MEASURED VALUE: 62 Ohm
Implantable Lead Serial Number: 124785
Lead Channel Impedance Value: 496 Ohm
Lead Channel Setting Pacing Amplitude: 2.6 V
Lead Channel Setting Pacing Pulse Width: 0.4 ms
MDC IDC LEAD IMPLANT DT: 20060117
MDC IDC LEAD LOCATION: 753860
MDC IDC LEAD MODEL: 185
MDC IDC MSMT BATTERY REMAINING PERCENTAGE: 100 %
MDC IDC PG SERIAL: 126350
MDC IDC SESS DTM: 20170808114100
MDC IDC SET LEADCHNL RV SENSING SENSITIVITY: 0.6 mV

## 2015-09-01 ENCOUNTER — Encounter: Payer: Self-pay | Admitting: Sports Medicine

## 2015-09-01 ENCOUNTER — Ambulatory Visit (INDEPENDENT_AMBULATORY_CARE_PROVIDER_SITE_OTHER): Payer: 59 | Admitting: Sports Medicine

## 2015-09-01 DIAGNOSIS — E119 Type 2 diabetes mellitus without complications: Secondary | ICD-10-CM

## 2015-09-01 DIAGNOSIS — L600 Ingrowing nail: Secondary | ICD-10-CM

## 2015-09-01 DIAGNOSIS — M79675 Pain in left toe(s): Secondary | ICD-10-CM

## 2015-09-01 NOTE — Patient Instructions (Signed)

## 2015-09-01 NOTE — Progress Notes (Signed)
Subjective: Justin Ray is a 50 y.o. Diabetic male patient presents to office today complaining of a painful incurvated, medial nail border of the 1st toe on the left foot. This has been present for several months. Patient has treated this by self trimming. Patient denies fever/chills/nausea/vomitting/any other related constitutional symptoms at this time.  FBS "good", A1c 7  Patient Active Problem List   Diagnosis Date Noted  . UARS (upper airway resistance syndrome) 08/26/2015  . Hyperlipidemia, mixed 08/26/2015  . OSA (obstructive sleep apnea) 05/08/2015  . Polycythemia 05/08/2015  . Dyslipidemia-primarily triglycerides 09/29/2014  . Chronic systolic heart failure ([place]) 04/14/2013  . Anxiety state 09/05/2011  . Dysthymic disorder 02/28/2011  . COPD with asthma ([place]) 02/28/2011  . Nicotine use disorder 02/28/2011  . Insomnia 02/28/2011  . Automatic implantable cardioverter-defibrillator in situ 02/06/2011  . DM (diabetes mellitus), type 2, uncontrolled ([place]) 01/26/2009  . NICM (nonischemic cardiomyopathy) ([place]) 01/26/2009  . Congestive heart failure ([place]) 01/26/2009    Current Outpatient Prescriptions on File Prior to Visit  Medication Sig Dispense Refill  . ALPRAZolam (XANAX) 1 MG tablet Take 1 tablet (1 mg total) by mouth 2 (two) times daily as needed for anxiety (for anxiety). 60 tablet 5  . aspirin 81 MG tablet Take 81 mg by mouth daily.     . blood glucose meter kit and supplies KIT Test blood sugar 3 times daily. Dx code: E11.65 1 each 0  . carvedilol (COREG) 25 MG tablet take 1 tablet by mouth twice a day with A MEAL 180 tablet 3  . desvenlafaxine (PRISTIQ) 100 MG 24 hr tablet TAKE 1 TABLET BY MOUTH ONCE DAILY. 90 tablet 3  . digoxin (DIGOX) 0.125 MG tablet Take 1 tablet (125 mcg total) by mouth daily. 90 tablet 3  . furosemide (LASIX) 40 MG tablet Take 1 tablet (40 mg total) by mouth as needed. 30 tablet 6  . insulin NPH-regular Human (HUMULIN 70/30) (70-30) 100 UNIT/ML  injection inject 65 units subcutaneously every morning and inject 50-55 units subcutaneously every evening 40 vial 11  . lisinopril (PRINIVIL,ZESTRIL) 20 MG tablet Take 1 tablet (20 mg total) by mouth 2 (two) times daily. 180 tablet 3  . rosuvastatin (CRESTOR) 20 MG tablet Take 20 mg by mouth daily.  0  . triazolam (HALCION) 0.25 MG tablet Take 1 tablet (0.25 mg total) by mouth at bedtime as needed for sleep. 30 tablet 5  . VASCEPA 1 g CAPS Take 2 capsules by mouth 2 (two) times daily. 120 capsule 11   No current facility-administered medications on file prior to visit.     Allergies  Allergen Reactions  . Codeine Other (See Comments)    Severe HA's.  . Niaspan [Niacin Er] Other (See Comments)    Per pt feels like skin is burning( REDNESS), and pins/needles are sticking   . Sulfonamide Derivatives Other (See Comments)    thrush    Objective:  There were no vitals filed for this visit.  General: Well developed, nourished, in no acute distress, alert and oriented x3   Dermatology: Skin is warm, dry and supple bilateral. Left hallux nail appears to be severely incurvated with hyperkeratosis formation at the distal aspects of the medial  nail border. (-) Erythema. (-) Edema. (-) serosanguous drainage present with mild thickening of nail. The remaining nails appear unremarkable at this time, free from ingrowing. There are no open sores, lesions or other signs of infection present.  Vascular: Dorsalis Pedis artery and Posterior Tibial artery pedal pulses  are 2/4 bilateral with immedate capillary fill time. Pedal hair growth present. No lower extremity edema.   Neruologic: Grossly intact via light touch bilateral. Protective and vibratory intact bilateral.  Musculoskeletal: Tenderness to palpation of the left hallux medial nail fold. Muscular strength within normal limits in all groups bilateral.   Assesement and Plan: Problem List Items Addressed This Visit    None    Visit Diagnoses     Ingrown toenail    -  Primary   Toe pain, left       Diabetes mellitus without complication ([place])          -Discussed treatment alternatives and plan of care; Explained permanent/temporary nail avulsion and post procedure course to patient.Patient opt for permanent nail avulsion, left hallux medial border - After a verbal consent, injected 3 ml of a 50:50 mixture of 2% plain  lidocaine and 0.5% plain marcaine in a normal hallux block fashion. Next, a betadine prep was performed. Anesthesia was tested and found to be appropriate.  The offending left hallux medial nail border was then incised from the hyponychium to the epinychium. The offending nail border was removed and cleared from the field. The area was curretted for any remaining nail or spicules. Phenol application performed and the area was then flushed with alcohol and dressed with antibiotic cream and a dry sterile dressing. -Patient was instructed to leave the dressing intact for today and begin soaking  in a weak solution of betadine and water tomorrow. Patient was instructed to  soak for 15 minutes each day and apply neosporin and a gauze or bandaid dressing each day. -Patient was instructed to monitor the toe for signs of infection and return to office if toe becomes red, hot or swollen. Recommend daily foot inspection in setting of diabetes. -Advised ice, elevation, and tylenol or motrin if needed for pain.  -Patient is to return in 1-2 weeks for follow up care/nail check or sooner if problems arise.  Landis Martins, DPM

## 2015-09-09 ENCOUNTER — Encounter: Payer: Self-pay | Admitting: Cardiology

## 2015-09-15 ENCOUNTER — Encounter: Payer: Self-pay | Admitting: Sports Medicine

## 2015-09-15 ENCOUNTER — Ambulatory Visit (INDEPENDENT_AMBULATORY_CARE_PROVIDER_SITE_OTHER): Payer: 59 | Admitting: Sports Medicine

## 2015-09-15 DIAGNOSIS — M79675 Pain in left toe(s): Secondary | ICD-10-CM

## 2015-09-15 DIAGNOSIS — E119 Type 2 diabetes mellitus without complications: Secondary | ICD-10-CM

## 2015-09-15 DIAGNOSIS — Z9889 Other specified postprocedural states: Secondary | ICD-10-CM

## 2015-09-15 NOTE — Patient Instructions (Signed)

## 2015-09-15 NOTE — Progress Notes (Signed)
Subjective: Justin Ray is a 50 y.o. diabetic male patient returns to office today for follow up evaluation after having Left Hallux medial permanent nail avulsion performed on 09/01/2015 Patient has been soaking using betadine and applying topical antibiotic covered with bandaid daily. Patient deniesfever/chills/nausea/vomitting/any other related constitutional symptoms at this time.  Patient Active Problem List   Diagnosis Date Noted  . UARS (upper airway resistance syndrome) 08/26/2015  . Hyperlipidemia, mixed 08/26/2015  . OSA (obstructive sleep apnea) 05/08/2015  . Polycythemia 05/08/2015  . Dyslipidemia-primarily triglycerides 09/29/2014  . Chronic systolic heart failure ([place]) 04/14/2013  . Anxiety state 09/05/2011  . Dysthymic disorder 02/28/2011  . COPD with asthma ([place]) 02/28/2011  . Nicotine use disorder 02/28/2011  . Insomnia 02/28/2011  . Automatic implantable cardioverter-defibrillator in situ 02/06/2011  . DM (diabetes mellitus), type 2, uncontrolled ([place]) 01/26/2009  . NICM (nonischemic cardiomyopathy) ([place]) 01/26/2009  . Congestive heart failure ([place]) 01/26/2009    Current Outpatient Prescriptions on File Prior to Visit  Medication Sig Dispense Refill  . ALPRAZolam (XANAX) 1 MG tablet Take 1 tablet (1 mg total) by mouth 2 (two) times daily as needed for anxiety (for anxiety). 60 tablet 5  . aspirin 81 MG tablet Take 81 mg by mouth daily.     . blood glucose meter kit and supplies KIT Test blood sugar 3 times daily. Dx code: E11.65 1 each 0  . carvedilol (COREG) 25 MG tablet take 1 tablet by mouth twice a day with A MEAL 180 tablet 3  . desvenlafaxine (PRISTIQ) 100 MG 24 hr tablet TAKE 1 TABLET BY MOUTH ONCE DAILY. 90 tablet 3  . digoxin (DIGOX) 0.125 MG tablet Take 1 tablet (125 mcg total) by mouth daily. 90 tablet 3  . furosemide (LASIX) 40 MG tablet Take 1 tablet (40 mg total) by mouth as needed. 30 tablet 6  . insulin NPH-regular Human (HUMULIN 70/30) (70-30) 100  UNIT/ML injection inject 65 units subcutaneously every morning and inject 50-55 units subcutaneously every evening 40 vial 11  . lisinopril (PRINIVIL,ZESTRIL) 20 MG tablet Take 1 tablet (20 mg total) by mouth 2 (two) times daily. 180 tablet 3  . rosuvastatin (CRESTOR) 20 MG tablet Take 20 mg by mouth daily.  0  . triazolam (HALCION) 0.25 MG tablet Take 1 tablet (0.25 mg total) by mouth at bedtime as needed for sleep. 30 tablet 5  . VASCEPA 1 g CAPS Take 2 capsules by mouth 2 (two) times daily. 120 capsule 11   No current facility-administered medications on file prior to visit.     Allergies  Allergen Reactions  . Codeine Other (See Comments)    Severe HA's.  . Niaspan [Niacin Er] Other (See Comments)    Per pt feels like skin is burning( REDNESS), and pins/needles are sticking   . Sulfonamide Derivatives Other (See Comments)    thrush    Objective:  General: Well developed, nourished, in no acute distress, alert and oriented x3   Dermatology: Skin is warm, dry and supple bilateral. Left hallux medial bed appears to be clean, dry, with mild granular tissue and surrounding eschar/scab. (-) Erythema. (-) Edema. (-) serosanguous drainage present. The remaining nails appear unremarkable at this time. There are no other lesions or other signs of infection  present.  Neurovascular status: Intact. No lower extremity swelling; No pain with calf compression bilateral.  Musculoskeletal: Decreased tenderness to palpation of the left hallux medial hallux nail fold. Muscular strength within normal limits bilateral.   Assesement and Plan: Problem  List Items Addressed This Visit    None    Visit Diagnoses    S/P nail surgery    -  Primary   Toe pain, left       Diabetes mellitus without complication ([place])         -Examined patient  -Cleansed left hallux medial nail fold and gently scrubbed with peroxide and q-tip/curetted away eschar at site and applied antibiotic cream covered with bandaid.   -Discussed plan of care with patient. -Patient to now begin soaking in a weak solution of Epsom salt and warm water. Patient was instructed to soak for 15-20 minutes each day until the toe appears normal and there is no drainage, redness, tenderness, or swelling at the procedure site, and apply neosporin and a gauze or bandaid dressing each day as needed. May leave open to air at night. -Educated patient on long term care after nail surgery. -Patient was instructed to monitor the toe for reoccurrence and signs of infection; Patient advised to return to office or go to ER if toe becomes red, hot or swollen. -Patient is to return in 8 weeks for follow-up evaluation and diabetic foot exam or sooner if problems arise.  Landis Martins, DPM

## 2015-11-04 ENCOUNTER — Other Ambulatory Visit: Payer: Self-pay | Admitting: Cardiovascular Disease

## 2015-11-04 NOTE — Telephone Encounter (Signed)
Rx(s) sent to pharmacy electronically.  

## 2015-11-16 ENCOUNTER — Encounter: Payer: Self-pay | Admitting: Sports Medicine

## 2015-11-16 ENCOUNTER — Ambulatory Visit (INDEPENDENT_AMBULATORY_CARE_PROVIDER_SITE_OTHER): Payer: 59 | Admitting: Sports Medicine

## 2015-11-16 DIAGNOSIS — E119 Type 2 diabetes mellitus without complications: Secondary | ICD-10-CM | POA: Diagnosis not present

## 2015-11-16 DIAGNOSIS — L600 Ingrowing nail: Secondary | ICD-10-CM

## 2015-11-16 DIAGNOSIS — Z9889 Other specified postprocedural states: Secondary | ICD-10-CM

## 2015-11-16 NOTE — Progress Notes (Signed)
Subjective: Justin Ray is a 50 y.o. diabetic male patient returns to office today for follow up evaluation after having Left Hallux medial permanent nail avulsion performed on 09/01/2015. Patient is also here for diabetic foot check. Reports no issues with the nail surgical site. Patient deniesfever/chills/nausea/vomitting/any other related constitutional symptoms at this time.  Patient Active Problem List   Diagnosis Date Noted  . UARS (upper airway resistance syndrome) 08/26/2015  . Hyperlipidemia, mixed 08/26/2015  . OSA (obstructive sleep apnea) 05/08/2015  . Polycythemia 05/08/2015  . Dyslipidemia-primarily triglycerides 09/29/2014  . Chronic systolic heart failure ([place]) 04/14/2013  . Anxiety state 09/05/2011  . Dysthymic disorder 02/28/2011  . COPD with asthma ([place]) 02/28/2011  . Nicotine use disorder 02/28/2011  . Insomnia 02/28/2011  . Automatic implantable cardioverter-defibrillator in situ 02/06/2011  . DM (diabetes mellitus), type 2, uncontrolled ([place]) 01/26/2009  . NICM (nonischemic cardiomyopathy) ([place]) 01/26/2009  . Congestive heart failure ([place]) 01/26/2009    Current Outpatient Prescriptions on File Prior to Visit  Medication Sig Dispense Refill  . ALPRAZolam (XANAX) 1 MG tablet Take 1 tablet (1 mg total) by mouth 2 (two) times daily as needed for anxiety (for anxiety). 60 tablet 5  . aspirin 81 MG tablet Take 81 mg by mouth daily.     . blood glucose meter kit and supplies KIT Test blood sugar 3 times daily. Dx code: E11.65 1 each 0  . carvedilol (COREG) 25 MG tablet take 1 tablet by mouth twice a day with A MEAL 180 tablet 3  . desvenlafaxine (PRISTIQ) 100 MG 24 hr tablet TAKE 1 TABLET BY MOUTH ONCE DAILY. 90 tablet 3  . digoxin (DIGOX) 0.125 MG tablet Take 1 tablet (125 mcg total) by mouth daily. 90 tablet 3  . furosemide (LASIX) 40 MG tablet Take 1 tablet (40 mg total) by mouth as needed. 30 tablet 6  . insulin NPH-regular Human (HUMULIN 70/30) (70-30) 100  UNIT/ML injection inject 65 units subcutaneously every morning and inject 50-55 units subcutaneously every evening 40 vial 11  . lisinopril (PRINIVIL,ZESTRIL) 20 MG tablet Take 1 tablet (20 mg total) by mouth 2 (two) times daily. 180 tablet 3  . rosuvastatin (CRESTOR) 20 MG tablet take 1 tablet by mouth once daily 30 tablet 8  . triazolam (HALCION) 0.25 MG tablet Take 1 tablet (0.25 mg total) by mouth at bedtime as needed for sleep. 30 tablet 5  . VASCEPA 1 g CAPS Take 2 capsules by mouth 2 (two) times daily. 120 capsule 11   No current facility-administered medications on file prior to visit.     Allergies  Allergen Reactions  . Codeine Other (See Comments)    Severe HA's.  . Niaspan [Niacin Er] Other (See Comments)    Per pt feels like skin is burning( REDNESS), and pins/needles are sticking   . Other     Other reaction(s): Other (See Comments) Per pt feels like skin is burning( REDNESS), and pins/needles are sticking   . Sulfonamide Derivatives Other (See Comments)    thrush    Objective:  General: Well developed, nourished, in no acute distress, alert and oriented x3   Dermatology: Skin is warm, dry and supple bilateral. Left hallux medial bed appears to be healed. The remaining nails appear unremarkable at this time. There are no other lesions or other signs of infection  present.  Neurovascular status: Intact. No lower extremity swelling; No pain with calf compression bilateral.  Musculoskeletal: No tenderness to palpation of the left hallux medial hallux nail  fold. Muscular strength within normal limits bilateral.   Assesement and Plan: Problem List Items Addressed This Visit    None    Visit Diagnoses    S/P nail surgery    -  Primary   Left hallux medial margin well healed   Diabetes mellitus without complication ([place])         -Examined patient  -Left hallux well healed -No acute diabetic foot concerns -Patient is to return yearly preventative diabetic foot exam  or sooner if problems arise.  Landis Martins, DPM

## 2015-11-22 ENCOUNTER — Ambulatory Visit (INDEPENDENT_AMBULATORY_CARE_PROVIDER_SITE_OTHER): Payer: 59 | Admitting: *Deleted

## 2015-11-22 DIAGNOSIS — I428 Other cardiomyopathies: Secondary | ICD-10-CM | POA: Diagnosis not present

## 2015-11-22 NOTE — Progress Notes (Signed)
Remote ICD transmission.   

## 2015-11-23 ENCOUNTER — Encounter: Payer: Self-pay | Admitting: Cardiology

## 2015-12-07 ENCOUNTER — Encounter: Payer: Self-pay | Admitting: Cardiology

## 2015-12-20 LAB — CUP PACEART REMOTE DEVICE CHECK
Brady Statistic RV Percent Paced: 0 %
HIGH POWER IMPEDANCE MEASURED VALUE: 66 Ohm
Implantable Lead Model: 185
Lead Channel Impedance Value: 571 Ohm
Lead Channel Pacing Threshold Amplitude: 1.3 V
Lead Channel Setting Pacing Amplitude: 2.6 V
Lead Channel Setting Pacing Pulse Width: 0.4 ms
MDC IDC LEAD IMPLANT DT: 20060117
MDC IDC LEAD LOCATION: 753860
MDC IDC LEAD SERIAL: 124785
MDC IDC MSMT BATTERY REMAINING LONGEVITY: 120 mo
MDC IDC MSMT BATTERY REMAINING PERCENTAGE: 100 %
MDC IDC MSMT LEADCHNL RV PACING THRESHOLD PULSEWIDTH: 0.4 ms
MDC IDC PG IMPLANT DT: 20141107
MDC IDC PG SERIAL: 126350
MDC IDC SESS DTM: 20171107133000
MDC IDC SET LEADCHNL RV SENSING SENSITIVITY: 0.6 mV

## 2016-02-13 DIAGNOSIS — I499 Cardiac arrhythmia, unspecified: Secondary | ICD-10-CM | POA: Diagnosis not present

## 2016-02-13 DIAGNOSIS — I10 Essential (primary) hypertension: Secondary | ICD-10-CM | POA: Diagnosis not present

## 2016-02-13 DIAGNOSIS — E7800 Pure hypercholesterolemia, unspecified: Secondary | ICD-10-CM | POA: Diagnosis not present

## 2016-02-13 DIAGNOSIS — E1165 Type 2 diabetes mellitus with hyperglycemia: Secondary | ICD-10-CM | POA: Diagnosis not present

## 2016-02-21 ENCOUNTER — Telehealth: Payer: Self-pay | Admitting: Cardiovascular Disease

## 2016-02-21 ENCOUNTER — Ambulatory Visit (INDEPENDENT_AMBULATORY_CARE_PROVIDER_SITE_OTHER): Payer: 59 | Admitting: *Deleted

## 2016-02-21 DIAGNOSIS — I428 Other cardiomyopathies: Secondary | ICD-10-CM | POA: Diagnosis not present

## 2016-02-21 LAB — CUP PACEART REMOTE DEVICE CHECK
Battery Remaining Longevity: 114 mo
Date Time Interrogation Session: 20180206130900
HIGH POWER IMPEDANCE MEASURED VALUE: 65 Ohm
Implantable Lead Location: 753860
Lead Channel Setting Pacing Amplitude: 2.6 V
Lead Channel Setting Pacing Pulse Width: 0.4 ms
MDC IDC LEAD IMPLANT DT: 20060117
MDC IDC LEAD SERIAL: 124785
MDC IDC MSMT BATTERY REMAINING PERCENTAGE: 100 %
MDC IDC MSMT LEADCHNL RV IMPEDANCE VALUE: 506 Ohm
MDC IDC MSMT LEADCHNL RV PACING THRESHOLD AMPLITUDE: 1.3 V
MDC IDC MSMT LEADCHNL RV PACING THRESHOLD PULSEWIDTH: 0.4 ms
MDC IDC PG IMPLANT DT: 20141107
MDC IDC PG SERIAL: 126350
MDC IDC SET LEADCHNL RV SENSING SENSITIVITY: 0.6 mV
MDC IDC STAT BRADY RV PERCENT PACED: 0 %

## 2016-02-21 NOTE — Telephone Encounter (Signed)
Left message, last EF% was 45 with echo in 2015.

## 2016-02-21 NOTE — Progress Notes (Signed)
Remote ICD transmission.   

## 2016-02-21 NOTE — Telephone Encounter (Signed)
She need his last EF from his echo please.

## 2016-02-24 ENCOUNTER — Encounter: Payer: Self-pay | Admitting: Cardiology

## 2016-03-08 DIAGNOSIS — L570 Actinic keratosis: Secondary | ICD-10-CM | POA: Diagnosis not present

## 2016-03-09 ENCOUNTER — Encounter: Payer: Self-pay | Admitting: Cardiology

## 2016-03-30 ENCOUNTER — Ambulatory Visit (INDEPENDENT_AMBULATORY_CARE_PROVIDER_SITE_OTHER): Payer: 59

## 2016-03-30 ENCOUNTER — Ambulatory Visit (INDEPENDENT_AMBULATORY_CARE_PROVIDER_SITE_OTHER): Payer: 59 | Admitting: Family Medicine

## 2016-03-30 VITALS — BP 106/70 | HR 81 | Temp 97.9°F | Resp 16 | Ht 68.0 in | Wt 196.0 lb

## 2016-03-30 DIAGNOSIS — R0600 Dyspnea, unspecified: Secondary | ICD-10-CM

## 2016-03-30 DIAGNOSIS — R0789 Other chest pain: Secondary | ICD-10-CM | POA: Diagnosis not present

## 2016-03-30 DIAGNOSIS — R0781 Pleurodynia: Secondary | ICD-10-CM | POA: Diagnosis not present

## 2016-03-30 DIAGNOSIS — W19XXXA Unspecified fall, initial encounter: Secondary | ICD-10-CM | POA: Diagnosis not present

## 2016-03-30 DIAGNOSIS — Y92099 Unspecified place in other non-institutional residence as the place of occurrence of the external cause: Secondary | ICD-10-CM

## 2016-03-30 DIAGNOSIS — Y92009 Unspecified place in unspecified non-institutional (private) residence as the place of occurrence of the external cause: Secondary | ICD-10-CM

## 2016-03-30 DIAGNOSIS — R42 Dizziness and giddiness: Secondary | ICD-10-CM

## 2016-03-30 LAB — POCT CBC
Granulocyte percent: 71.3 %G (ref 37–80)
HCT, POC: 47.7 % (ref 43.5–53.7)
Hemoglobin: 17.2 g/dL (ref 14.1–18.1)
Lymph, poc: 1.7 (ref 0.6–3.4)
MCH, POC: 31.9 pg — AB (ref 27–31.2)
MCHC: 36.1 g/dL — AB (ref 31.8–35.4)
MCV: 88.3 fL (ref 80–97)
MID (cbc): 0.6 (ref 0–0.9)
MPV: 6.7 fL (ref 0–99.8)
POC Granulocyte: 5.8 (ref 2–6.9)
POC LYMPH PERCENT: 21.1 %L (ref 10–50)
POC MID %: 7.6 %M (ref 0–12)
Platelet Count, POC: 145 10*3/uL (ref 142–424)
RBC: 5.4 M/uL (ref 4.69–6.13)
RDW, POC: 13.4 %
WBC: 8.2 10*3/uL (ref 4.6–10.2)

## 2016-03-30 LAB — GLUCOSE, POCT (MANUAL RESULT ENTRY): POC Glucose: 109 mg/dl — AB (ref 70–99)

## 2016-03-30 MED ORDER — HYDROCODONE-ACETAMINOPHEN 5-325 MG PO TABS: 1.0000 | ORAL_TABLET | Freq: Four times a day (QID) | ORAL | 0 refills | Status: DC | PRN

## 2016-03-30 NOTE — Patient Instructions (Addendum)
I do not see any fracture or broken ribs, but as we discussed sometimes those are supple. Take hydrocodone up to every 6 hours as needed for pain, and Tylenol you to breathe deeper. Do not operate machinery or drive while taking that medication. See other information below on rib contusions and chest wall pain. If that pain is worsening or shortness of breath is worsening, return here or emergency room.  For your lightheadedness or dizziness, that may be due to pain, but also may want to decrease your blood pressure medicine carvedilol to one half pill twice per day until the symptoms have improved. I would recommend discussing that with your cardiologist. Additionally would recommend you call your cardiologist office to let them know about the injury to the chest wall as they may need to test your AICD again.  Return to the clinic or go to the nearest emergency room if any of your symptoms worsen or new symptoms occur.   Chest Contusion, Adult A chest contusion is a deep bruise to the chest. Contusions are usually the result of a blunt injury to tissues under the skin. The injury can damage the small blood vessels under the skin, which causes bleeding under the skin. The skin overlying the contusion may turn blue, purple, or yellow. Minor injuries may give you a painless contusion, but more severe contusions may stay painful and swollen for a few weeks. What are the causes? A contusion is usually caused by a hard hit (blow), trauma, or direct force to your chest, such as:  A motor vehicle accident.  Falls.  Bicycle injuries.  Contact sport injuries. What increases the risk? You may be at a higher risk for a chest contusion if you play a sport in which falls and contact are common, such as football or soccer. What are the signs or symptoms? Symptoms of this condition include:  Chest swelling.  Pain and tenderness of the chest.  Discomfort with certain movements of the upper  torso.  Discoloration of the chest. The area may have redness and then turn blue, purple, or yellow.  Discomfort when taking deep breaths. How is this diagnosed? A chest contusion is diagnosed from a physical exam and your medical history. An X-ray may be needed to determine if there were any associated injuries, such as broken bones (fractures). Sometimes other tests such as CT scans, ultrasounds, or MRIs may be needed if internal injuries are suspected. A test that shows the amount of oxygen in your blood (pulse oximetry) may be done if you have trouble breathing. How is this treated? Often, the best treatment for a chest contusion is resting and applying ice to the injured area. Deep-breathing exercises may be recommended to reduce the risk of pneumonia. Oxygen therapy may be given if you have trouble breathing or have low oxygen levels. Over-the-counter medicines may also be recommended for pain control. Follow these instructions at home:  If directed, apply ice to the injured area.  Put ice in a plastic bag.  Place a towel between your skin and the bag.  Leave the ice on for 20 minutes, 2-3 times per day.  Take over-the-counter and prescription medicines only as told by your health care provider.  Do any deep-breathing exercises as told by your health care provider, if this applies.  Do not lie down flat on your back. Keep your head and chest raised (elevated) when you are resting or sleeping.  Do not use any products that contain nicotine or tobacco,  such as cigarettes and e-cigarettes. If you need help quitting, ask your health care provider.  Do not lift anything that causes you discomfort or pain. Contact a health care provider if:  Your swelling or pain is not relieved with medicines or treatment.  You have increased bruising or swelling.  You have pain that is getting worse.  Your symptoms have not improved after one week. Get help right away if:  You have a sudden,  significant increase in pain.  You have difficulty breathing.  You have dizziness, weakness, or fainting.  You have blood in your urine or stool.  You cough up blood or you vomit blood. Summary  A chest contusion is a deep bruise to the chest that is usually caused by a hard hit, trauma, or direct force to your chest.  Treatment for a chest contusion may include resting and applying ice to the injured area.  Contact a health care provider if you have problems breathing or if your pain does not improve with treatment. This information is not intended to replace advice given to you by your health care provider. Make sure you discuss any questions you have with your health care provider. Document Released: 09/26/2000 Document Revised: 09/29/2015 Document Reviewed: 09/29/2015 Elsevier Interactive Patient Education  2017 [employer]  Chest Wall Pain Chest wall pain is pain in or around the bones and muscles of your chest. Sometimes, an injury causes this pain. Sometimes, the cause may not be known. This pain may take several weeks or longer to get better. Follow these instructions at home: Pay attention to any changes in your symptoms. Take these actions to help with your pain:  Rest as told by your health care provider.  Avoid activities that cause pain. These include any activities that use your chest muscles or your abdominal and side muscles to lift heavy items.  If directed, apply ice to the painful area:  Put ice in a plastic bag.  Place a towel between your skin and the bag.  Leave the ice on for 20 minutes, 2-3 times per day.  Take over-the-counter and prescription medicines only as told by your health care provider.  Do not use tobacco products, including cigarettes, chewing tobacco, and e-cigarettes. If you need help quitting, ask your health care provider.  Keep all follow-up visits as told by your health care provider. This is important. Contact a health care  provider if:  You have a fever.  Your chest pain becomes worse.  You have new symptoms. Get help right away if:  You have nausea or vomiting.  You feel sweaty or light-headed.  You have a cough with phlegm (sputum) or you cough up blood.  You develop shortness of breath. This information is not intended to replace advice given to you by your health care provider. Make sure you discuss any questions you have with your health care provider. Document Released: 01/01/2005 Document Revised: 05/12/2015 Document Reviewed: 03/29/2014 Elsevier Interactive Patient Education  2017 [employer]   Dizziness Dizziness is a common problem. It is a feeling of unsteadiness or light-headedness. You may feel like you are about to faint. Dizziness can lead to injury if you stumble or fall. Anyone can become dizzy, but dizziness is more common in older adults. This condition can be caused by a number of things, including medicines, dehydration, or illness. Follow these instructions at home: Taking these steps may help with your condition: Eating and drinking   Drink enough fluid to keep your  urine clear or pale yellow. This helps to keep you from becoming dehydrated. Try to drink more clear fluids, such as water.  Do not drink alcohol.  Limit your caffeine intake if directed by your health care provider.  Limit your salt intake if directed by your health care provider. Activity   Avoid making quick movements.  Rise slowly from chairs and steady yourself until you feel okay.  In the morning, first sit up on the side of the bed. When you feel okay, stand slowly while you hold onto something until you know that your balance is fine.  Move your legs often if you need to stand in one place for a long time. Tighten and relax your muscles in your legs while you are standing.  Do not drive or operate heavy machinery if you feel dizzy.  Avoid bending down if you feel dizzy. Place items in your home  so that they are easy for you to reach without leaning over. Lifestyle   Do not use any tobacco products, including cigarettes, chewing tobacco, or electronic cigarettes. If you need help quitting, ask your health care provider.  Try to reduce your stress level, such as with yoga or meditation. Talk with your health care provider if you need help. General instructions   Watch your dizziness for any changes.  Take medicines only as directed by your health care provider. Talk with your health care provider if you think that your dizziness is caused by a medicine that you are taking.  Tell a friend or a family member that you are feeling dizzy. If he or she notices any changes in your behavior, have this person call your health care provider.  Keep all follow-up visits as directed by your health care provider. This is important. Contact a health care provider if:  Your dizziness does not go away.  Your dizziness or light-headedness gets worse.  You feel nauseous.  You have reduced hearing.  You have new symptoms.  You are unsteady on your feet or you feel like the room is spinning. Get help right away if:  You vomit or have diarrhea and are unable to eat or drink anything.  You have problems talking, walking, swallowing, or using your arms, hands, or legs.  You feel generally weak.  You are not thinking clearly or you have trouble forming sentences. It may take a friend or family member to notice this.  You have chest pain, abdominal pain, shortness of breath, or sweating.  Your vision changes.  You notice any bleeding.  You have a headache.  You have neck pain or a stiff neck.  You have a fever. This information is not intended to replace advice given to you by your health care provider. Make sure you discuss any questions you have with your health care provider. Document Released: 06/27/2000 Document Revised: 06/09/2015 Document Reviewed: 12/28/2013 Elsevier  Interactive Patient Education  2017 [employer].    IF you received an x-ray today, you will receive an invoice from [facility]. Please contact [facility] at [phone] with questions or concerns regarding your invoice.   IF you received labwork today, you will receive an invoice from [facility]. Please contact [facility] at [phone] with questions or concerns regarding your invoice.   Our billing staff will not be able to assist you with questions regarding bills from these companies.  You will be contacted with the lab results as soon as they are available. The fastest way to get your results  is to activate your My Chart account. Instructions are located on the last page of this paperwork. If you have not heard from us regarding the results in 2 weeks, please contact this office.     

## 2016-03-30 NOTE — Progress Notes (Addendum)
Subjective:  By signing my name below, I, Moises Blood, attest that this documentation has been prepared under the direction and in the presence of Merri Ray, MD. Electronically Signed: Moises Blood, [place]. 03/30/2016 , 12:24 PM .  Patient was seen in Room 2 .   Patient ID: Justin Ray, male    DOB: Jun 15, 1965, 51 y.o.   MRN: [id number] Chief Complaint  Patient presents with  . Fall    fell Tuesday hit ribs on truck bed, hard to breathe   HPI Justin Ray is a 51 y.o. male  Patient has history of multiple medical problems including non-ischemic cardiac myopathy, type-2 diabetes, CHF with AICD with implants, tobacco use, polycythemia, and anxiety. He has history of broken rib over his left side, 25-30 years ago.   Patient states he was reaching for a bag of trash left in his truck. He went over the side of his truck and stepped up on the running board and slipped over. He felt his whole weight falling onto the side of the truck bed ledge on his left side. He now has left side pain and difficulty breathing 3 days ago. He also notes feeling lightheaded 2 days ago, but improved yesterday. He checked his sugar, measuring at around 185. He denies feeling lightheaded or dizziness today. He's taken 4~5 tablets of tylenol for this issue. He keeps having to take deep breaths, although it hurts to take deep breaths. He has a defibrillator in place, last check up was a month ago over the phone. He denies any shocks from the fall. Prior to the fall, he denies any chest pain or shortness of breath. His BP has decreased from his normal.   Patient Active Problem List   Diagnosis Date Noted  . UARS (upper airway resistance syndrome) 08/26/2015  . Hyperlipidemia, mixed 08/26/2015  . OSA (obstructive sleep apnea) 05/08/2015  . Polycythemia 05/08/2015  . Dyslipidemia-primarily triglycerides 09/29/2014  . Chronic systolic heart failure ([place]) 04/14/2013  . Anxiety state 09/05/2011  . Dysthymic  disorder 02/28/2011  . COPD with asthma ([place]) 02/28/2011  . Nicotine use disorder 02/28/2011  . Insomnia 02/28/2011  . Automatic implantable cardioverter-defibrillator in situ 02/06/2011  . DM (diabetes mellitus), type 2, uncontrolled ([place]) 01/26/2009  . NICM (nonischemic cardiomyopathy) ([place]) 01/26/2009  . Congestive heart failure ([place]) 01/26/2009   Past Medical History:  Diagnosis Date  . Anxiety   . Cardiomyopathy   . Carpal tunnel syndrome 10/14   right-GSC  . Cataract   . CHF (congestive heart failure) ([place])   . Diabetes mellitus   . Dyslipidemia   . Hypertension   . ICD (implantable cardioverter-defibrillator) in place   . Sleep apnea    was tested many yr ago-does not use a cpap-  . Wears glasses    Past Surgical History:  Procedure Laterality Date  . CARDIAC DEFIBRILLATOR PLACEMENT  02/01/2004  . CARDIAC DEFIBRILLATOR PLACEMENT  11/14   replaced with new one  . CARPAL TUNNEL RELEASE Left 12/19/2012   Procedure: LEFT CARPAL TUNNEL RELEASE;  Surgeon: Roseanne Kaufman, MD;  Location: [facility];  Service: Orthopedics;  Laterality: Left;  . EYE SURGERY    . IMPLANTABLE CARDIOVERTER DEFIBRILLATOR (ICD) GENERATOR CHANGE N/A 11/21/2012   Procedure: ICD GENERATOR CHANGE;  Surgeon: Evans Lance, MD;  Location[facility] CATH LAB;  Service: Cardiovascular;  Laterality: N/A;  . TONSILLECTOMY     Allergies  Allergen Reactions  . Codeine Other (See Comments)    Severe HA's.  Marland Kitchen  Niaspan [Niacin Er] Other (See Comments)    Per pt feels like skin is burning( REDNESS), and pins/needles are sticking   . Other     Other reaction(s): Other (See Comments) Per pt feels like skin is burning( REDNESS), and pins/needles are sticking   . Sulfonamide Derivatives Other (See Comments)    thrush   Prior to Admission medications   Medication Sig Start Date End Date Taking? Authorizing Provider  ALPRAZolam Duanne Moron) 1 MG tablet Take 1 tablet (1 mg total) by mouth 2 (two) times daily as  needed for anxiety (for anxiety). 05/04/15  Yes Leandrew Koyanagi, MD  aspirin 81 MG tablet Take 81 mg by mouth daily.    Yes Historical Provider, MD  blood glucose meter kit and supplies KIT Test blood sugar 3 times daily. Dx code: E11.65 04/05/14  Yes Leandrew Koyanagi, MD  carvedilol (COREG) 25 MG tablet take 1 tablet by mouth twice a day with A MEAL 06/22/15  Yes Evans Lance, MD  desvenlafaxine (PRISTIQ) 100 MG 24 hr tablet TAKE 1 TABLET BY MOUTH ONCE DAILY. 05/04/15  Yes Leandrew Koyanagi, MD  digoxin (DIGOX) 0.125 MG tablet Take 1 tablet (125 mcg total) by mouth daily. 07/01/15  Yes Evans Lance, MD  furosemide (LASIX) 40 MG tablet Take 1 tablet (40 mg total) by mouth as needed. 05/04/15  Yes Leandrew Koyanagi, MD  insulin NPH-regular Human (HUMULIN 70/30) (70-30) 100 UNIT/ML injection inject 65 units subcutaneously every morning and inject 50-55 units subcutaneously every evening 05/04/15  Yes Leandrew Koyanagi, MD  lisinopril (PRINIVIL,ZESTRIL) 20 MG tablet Take 1 tablet (20 mg total) by mouth 2 (two) times daily. 06/22/15  Yes Evans Lance, MD  rosuvastatin (CRESTOR) 20 MG tablet take 1 tablet by mouth once daily 11/04/15  Yes Troy Sine, MD  triazolam (HALCION) 0.25 MG tablet Take 1 tablet (0.25 mg total) by mouth at bedtime as needed for sleep. 05/04/15  Yes Leandrew Koyanagi, MD  VASCEPA 1 g CAPS Take 2 capsules by mouth 2 (two) times daily. 08/02/15  Yes Troy Sine, MD   Social History   Social History  . Marital status: Widowed    Spouse name: N/A  . Number of children: 0  . Years of education: N/A   Occupational History  . maintenance Unemployed   Social History Main Topics  . Smoking status: Current Every Day Smoker    Packs/day: 1.50    Types: Cigarettes  . Smokeless tobacco: Never Used  . Alcohol use No  . Drug use: No  . Sexual activity: No     Comment: number of sex partners in the last 12 months  0   Other Topics Concern  . Not on file   Social  History Narrative  . No narrative on file   Review of Systems  Constitutional: Negative for fatigue and unexpected weight change.  Eyes: Negative for visual disturbance.  Respiratory: Positive for shortness of breath. Negative for cough and chest tightness.   Cardiovascular: Negative for chest pain, palpitations and leg swelling.  Gastrointestinal: Negative for abdominal pain and blood in stool.  Neurological: Positive for light-headedness. Negative for dizziness and headaches.       Objective:   Physical Exam  Constitutional: He is oriented to person, place, and time. He appears well-developed and well-nourished.  HENT:  Head: Normocephalic and atraumatic.  Eyes: EOM are normal. Pupils are equal, round, and reactive to light.  Neck: No JVD present.  Carotid bruit is not present.  Cardiovascular: Normal rate, regular rhythm and normal heart sounds.   No murmur heard. Pulmonary/Chest: Effort normal and breath sounds normal. He has no rales.  Well healed scar over left anterior chest, sternum non tender, xyphoid non tender, AICD is palpated over left upper chest without overlying ecchymosis, skin intact, tender along anterior chest wall from mid-clavicular line to anterior axillary line, from left nipple to approximately 2cm above the nipple, no sub-q emphysema Possible slight decreased breath sounds in left upper lobe  Abdominal: Soft. He exhibits no distension. There is no tenderness.  Musculoskeletal: He exhibits no edema.  Neurological: He is alert and oriented to person, place, and time.  Skin: Skin is warm and dry.  Psychiatric: He has a normal mood and affect.  Vitals reviewed.   Vitals:   03/30/16 1200  BP: 106/70  Pulse: 81  Resp: 16  Temp: 97.9 F (36.6 C)  TempSrc: Oral  SpO2: 98%  Weight: 196 lb (88.9 kg)  Height: '5\' 8"'$  (1.727 m)   Dg Ribs Unilateral W/chest Left  Result Date: 03/30/2016 CLINICAL DATA:  Left anterior chest wall pain from a fall 3 days ago.  Dyspnea. EXAM: LEFT RIBS AND CHEST - 3+ VIEW COMPARISON:  11/27/2005 FINDINGS: No convincing rib fracture.  No rib lesion. The heart is normal in size and configuration. There is a stable left anterior chest wall AICD. No mediastinal or hilar masses. No evidence of adenopathy. Clear lungs. No pleural effusion or pneumothorax. IMPRESSION: 1. No rib fracture or rib lesion. 2. No active cardiopulmonary disease. Electronically Signed   By: Lajean Manes M.D.   On: 03/30/2016 12:33   Results for orders placed or performed in visit on 03/30/16  POCT CBC  Result Value Ref Range   WBC 8.2 4.6 - 10.2 K/uL   Lymph, poc 1.7 0.6 - 3.4   POC LYMPH PERCENT 21.1 10 - 50 %L   MID (cbc) 0.6 0 - 0.9   POC MID % 7.6 0 - 12 %M   POC Granulocyte 5.8 2 - 6.9   Granulocyte percent 71.3 37 - 80 %G   RBC 5.40 4.69 - 6.13 M/uL   Hemoglobin 17.2 14.1 - 18.1 g/dL   HCT, POC 47.7 43.5 - 53.7 %   MCV 88.3 80 - 97 fL   MCH, POC 31.9 (A) 27 - 31.2 pg   MCHC 36.1 (A) 31.8 - 35.4 g/dL   RDW, POC 13.4 %   Platelet Count, POC 145 142 - 424 K/uL   MPV 6.7 0 - 99.8 fL  POCT glucose (manual entry)  Result Value Ref Range   POC Glucose 109 (A) 70 - 99 mg/dl   EKG: sinus rhythm, rate 78 no acute findings     Assessment & Plan:   Tyreik Delahoussaye is a 51 y.o. male Chest wall pain - Plan: DG Ribs Unilateral W/Chest Left Fall in home, initial encounter - Plan: DG Ribs Unilateral W/Chest Left Dyspnea, unspecified type  -Suspected chest wall contusion versus rib fracture not identified on imaging. No apparent pneumothorax, O2 sat normal, speaking complete sentences.  - lortab Q6h prn, work note provided as not to drive/operate machinery on narcotic. Handout given and RTC precautions.  Lightheadedness - Plan: POCT CBC, POCT glucose (manual entry), EKG 12-Lead  - reassuring CBC, glucose, and EKG without acute findings. May be due to pain, but borderline low blood pressure. Option of decreasing Coreg to one half pill twice a day  for now,  but can discuss with cardiology. . If persistent shortness of breath or any worsening of lightheadedness with shortness of breath, consider CT chest.  RTC precautions/ER precautions given  Meds ordered this encounter  Medications  . HYDROcodone-acetaminophen (NORCO/VICODIN) 5-325 MG tablet    Sig: Take 1 tablet by mouth every 6 (six) hours as needed for moderate pain.    Dispense:  20 tablet    Refill:  0   Patient Instructions    I do not see any fracture or broken ribs, but as we discussed sometimes those are supple. Take hydrocodone up to every 6 hours as needed for pain, and Tylenol you to breathe deeper. Do not operate machinery or drive while taking that medication. See other information below on rib contusions and chest wall pain. If that pain is worsening or shortness of breath is worsening, return here or emergency room.  For your lightheadedness or dizziness, that may be due to pain, but also may want to decrease your blood pressure medicine carvedilol to one half pill twice per day until the symptoms have improved. I would recommend discussing that with your cardiologist. Additionally would recommend you call your cardiologist office to let them know about the injury to the chest wall as they may need to test your AICD again.  Return to the clinic or go to the nearest emergency room if any of your symptoms worsen or new symptoms occur.   Chest Contusion, Adult A chest contusion is a deep bruise to the chest. Contusions are usually the result of a blunt injury to tissues under the skin. The injury can damage the small blood vessels under the skin, which causes bleeding under the skin. The skin overlying the contusion may turn blue, purple, or yellow. Minor injuries may give you a painless contusion, but more severe contusions may stay painful and swollen for a few weeks. What are the causes? A contusion is usually caused by a hard hit (blow), trauma, or direct force to your  chest, such as:  A motor vehicle accident.  Falls.  Bicycle injuries.  Contact sport injuries. What increases the risk? You may be at a higher risk for a chest contusion if you play a sport in which falls and contact are common, such as football or soccer. What are the signs or symptoms? Symptoms of this condition include:  Chest swelling.  Pain and tenderness of the chest.  Discomfort with certain movements of the upper torso.  Discoloration of the chest. The area may have redness and then turn blue, purple, or yellow.  Discomfort when taking deep breaths. How is this diagnosed? A chest contusion is diagnosed from a physical exam and your medical history. An X-ray may be needed to determine if there were any associated injuries, such as broken bones (fractures). Sometimes other tests such as CT scans, ultrasounds, or MRIs may be needed if internal injuries are suspected. A test that shows the amount of oxygen in your blood (pulse oximetry) may be done if you have trouble breathing. How is this treated? Often, the best treatment for a chest contusion is resting and applying ice to the injured area. Deep-breathing exercises may be recommended to reduce the risk of pneumonia. Oxygen therapy may be given if you have trouble breathing or have low oxygen levels. Over-the-counter medicines may also be recommended for pain control. Follow these instructions at home:  If directed, apply ice to the injured area.  Put ice in a plastic bag.  Place a towel  between your skin and the bag.  Leave the ice on for 20 minutes, 2-3 times per day.  Take over-the-counter and prescription medicines only as told by your health care provider.  Do any deep-breathing exercises as told by your health care provider, if this applies.  Do not lie down flat on your back. Keep your head and chest raised (elevated) when you are resting or sleeping.  Do not use any products that contain nicotine or tobacco,  such as cigarettes and e-cigarettes. If you need help quitting, ask your health care provider.  Do not lift anything that causes you discomfort or pain. Contact a health care provider if:  Your swelling or pain is not relieved with medicines or treatment.  You have increased bruising or swelling.  You have pain that is getting worse.  Your symptoms have not improved after one week. Get help right away if:  You have a sudden, significant increase in pain.  You have difficulty breathing.  You have dizziness, weakness, or fainting.  You have blood in your urine or stool.  You cough up blood or you vomit blood. Summary  A chest contusion is a deep bruise to the chest that is usually caused by a hard hit, trauma, or direct force to your chest.  Treatment for a chest contusion may include resting and applying ice to the injured area.  Contact a health care provider if you have problems breathing or if your pain does not improve with treatment. This information is not intended to replace advice given to you by your health care provider. Make sure you discuss any questions you have with your health care provider. Document Released: 09/26/2000 Document Revised: 09/29/2015 Document Reviewed: 09/29/2015 Elsevier Interactive Patient Education  2017 [employer]  Chest Wall Pain Chest wall pain is pain in or around the bones and muscles of your chest. Sometimes, an injury causes this pain. Sometimes, the cause may not be known. This pain may take several weeks or longer to get better. Follow these instructions at home: Pay attention to any changes in your symptoms. Take these actions to help with your pain:  Rest as told by your health care provider.  Avoid activities that cause pain. These include any activities that use your chest muscles or your abdominal and side muscles to lift heavy items.  If directed, apply ice to the painful area:  Put ice in a plastic bag.  Place a towel  between your skin and the bag.  Leave the ice on for 20 minutes, 2-3 times per day.  Take over-the-counter and prescription medicines only as told by your health care provider.  Do not use tobacco products, including cigarettes, chewing tobacco, and e-cigarettes. If you need help quitting, ask your health care provider.  Keep all follow-up visits as told by your health care provider. This is important. Contact a health care provider if:  You have a fever.  Your chest pain becomes worse.  You have new symptoms. Get help right away if:  You have nausea or vomiting.  You feel sweaty or light-headed.  You have a cough with phlegm (sputum) or you cough up blood.  You develop shortness of breath. This information is not intended to replace advice given to you by your health care provider. Make sure you discuss any questions you have with your health care provider. Document Released: 01/01/2005 Document Revised: 05/12/2015 Document Reviewed: 03/29/2014 Elsevier Interactive Patient Education  2017 [employer] Inc.   Dizziness Dizziness is a  common problem. It is a feeling of unsteadiness or light-headedness. You may feel like you are about to faint. Dizziness can lead to injury if you stumble or fall. Anyone can become dizzy, but dizziness is more common in older adults. This condition can be caused by a number of things, including medicines, dehydration, or illness. Follow these instructions at home: Taking these steps may help with your condition: Eating and drinking   Drink enough fluid to keep your urine clear or pale yellow. This helps to keep you from becoming dehydrated. Try to drink more clear fluids, such as water.  Do not drink alcohol.  Limit your caffeine intake if directed by your health care provider.  Limit your salt intake if directed by your health care provider. Activity   Avoid making quick movements.  Rise slowly from chairs and steady yourself until you feel  okay.  In the morning, first sit up on the side of the bed. When you feel okay, stand slowly while you hold onto something until you know that your balance is fine.  Move your legs often if you need to stand in one place for a long time. Tighten and relax your muscles in your legs while you are standing.  Do not drive or operate heavy machinery if you feel dizzy.  Avoid bending down if you feel dizzy. Place items in your home so that they are easy for you to reach without leaning over. Lifestyle   Do not use any tobacco products, including cigarettes, chewing tobacco, or electronic cigarettes. If you need help quitting, ask your health care provider.  Try to reduce your stress level, such as with yoga or meditation. Talk with your health care provider if you need help. General instructions   Watch your dizziness for any changes.  Take medicines only as directed by your health care provider. Talk with your health care provider if you think that your dizziness is caused by a medicine that you are taking.  Tell a friend or a family member that you are feeling dizzy. If he or she notices any changes in your behavior, have this person call your health care provider.  Keep all follow-up visits as directed by your health care provider. This is important. Contact a health care provider if:  Your dizziness does not go away.  Your dizziness or light-headedness gets worse.  You feel nauseous.  You have reduced hearing.  You have new symptoms.  You are unsteady on your feet or you feel like the room is spinning. Get help right away if:  You vomit or have diarrhea and are unable to eat or drink anything.  You have problems talking, walking, swallowing, or using your arms, hands, or legs.  You feel generally weak.  You are not thinking clearly or you have trouble forming sentences. It may take a friend or family member to notice this.  You have chest pain, abdominal pain, shortness of  breath, or sweating.  Your vision changes.  You notice any bleeding.  You have a headache.  You have neck pain or a stiff neck.  You have a fever. This information is not intended to replace advice given to you by your health care provider. Make sure you discuss any questions you have with your health care provider. Document Released: 06/27/2000 Document Revised: 06/09/2015 Document Reviewed: 12/28/2013 Elsevier Interactive Patient Education  2017 [employer].    IF you received an x-ray today, you will receive an invoice from [facility].  Please contact [facility] at [phone] with questions or concerns regarding your invoice.   IF you received labwork today, you will receive an invoice from [place] [facility]. Please contact [facility] at [phone] with questions or concerns regarding your invoice.   Our billing staff will not be able to assist you with questions regarding bills from these companies.  You will be contacted with the lab results as soon as they are available. The fastest way to get your results is to activate your My Chart account. Instructions are located on the last page of this paperwork. If you have not heard from [place] regarding the results in 2 weeks, please contact this office.       I personally performed the services described in this documentation, which was scribed in my presence. The recorded information has been reviewed and considered for accuracy and completeness, addended by me as needed, and agree with information above.  Signed,   Merri Ray, MD [facility] [place].  03/30/16 1:30 PM

## 2016-05-16 DIAGNOSIS — I10 Essential (primary) hypertension: Secondary | ICD-10-CM | POA: Diagnosis not present

## 2016-05-16 DIAGNOSIS — E7800 Pure hypercholesterolemia, unspecified: Secondary | ICD-10-CM | POA: Diagnosis not present

## 2016-05-16 DIAGNOSIS — E1165 Type 2 diabetes mellitus with hyperglycemia: Secondary | ICD-10-CM | POA: Diagnosis not present

## 2016-06-01 ENCOUNTER — Ambulatory Visit (INDEPENDENT_AMBULATORY_CARE_PROVIDER_SITE_OTHER): Payer: 59 | Admitting: Cardiovascular Disease

## 2016-06-01 ENCOUNTER — Encounter: Payer: Self-pay | Admitting: Cardiovascular Disease

## 2016-06-01 VITALS — BP 116/80 | HR 69 | Ht 68.0 in | Wt 195.0 lb

## 2016-06-01 DIAGNOSIS — M25511 Pain in right shoulder: Secondary | ICD-10-CM | POA: Diagnosis not present

## 2016-06-01 DIAGNOSIS — I428 Other cardiomyopathies: Secondary | ICD-10-CM | POA: Diagnosis not present

## 2016-06-01 DIAGNOSIS — F17200 Nicotine dependence, unspecified, uncomplicated: Secondary | ICD-10-CM

## 2016-06-01 DIAGNOSIS — F411 Generalized anxiety disorder: Secondary | ICD-10-CM | POA: Diagnosis not present

## 2016-06-01 DIAGNOSIS — G8929 Other chronic pain: Secondary | ICD-10-CM | POA: Diagnosis not present

## 2016-06-01 DIAGNOSIS — I5042 Chronic combined systolic (congestive) and diastolic (congestive) heart failure: Secondary | ICD-10-CM | POA: Diagnosis not present

## 2016-06-01 DIAGNOSIS — Z95810 Presence of automatic (implantable) cardiac defibrillator: Secondary | ICD-10-CM

## 2016-06-01 DIAGNOSIS — G478 Other sleep disorders: Secondary | ICD-10-CM

## 2016-06-01 NOTE — Progress Notes (Signed)
Patient ID: Justin Ray, male   DOB: April 27, 1965, 51 y.o.   MRN: [id number]     Primary MD: Dr. Tami Lin  PATIENT PROFILE: Justin Ray is a 51 y.o. male who was referred through the courtesy of Dr. Laney Pastor for cardiology care with me.  The patient had been previously followed in [place] for his cardiology care has also seen Dr. Lovena Le who follows him from an electrophysiologic standpoint with his history of cardiomyopathy and ICD implantation.  He presents for a 9 month re-evaluation.  HPI:  Justin Ray has history of chronic systolic heart failure, class II and is felt to have a nonischemic cardiomyopathy.  He underwent insertion of an ICD in January 2006 and in November 2014 underwent ICD generator change by Dr.Taylor.  He has a history of anxiety/depression, type 2 diabetes mellitus on insulin, hyperlipidemia, and has continued have difficulty with elevated triglycerides.  He had been followed by the cardiology group in [place], but has developed recent discontent with his physician and is requested a new cardiology evaluation.  The patient has been on lisinopril 20 mg twice a day, digoxin 0.125 mg and carvedilol 25 mg twice a day for his coronary myopathy.  Hypertension.  He has been on Crestor 20 mg for hyperlipidemia.  An earlier this year he was started on placebo 2 capsules twice a day due to persistent elevated triglyceride levels.  His last lipid study 2 months ago showed a total cholesterol of 73, triglycerides 225, HDL 27, and EDL 45.  I suspect his LDL is inaccurate and this was recorded as 1.  He has a history of continued tobacco use, and started smoking at age 84 and currently is trying to quit but is still smoking.  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  He does not routinely exercise.  In 2004 he apparently underwent a sleep study and was told of having obstructive sleep apnea but was never started on CPAP therapy.   Presently, he goes to bed about 1 AM and wakes up at 8 AM.  He often is up at least 2-4 times per night.  His sleep is nonrestorative.  He snores loudly.  He does note daytime sleepiness.  He is diabetic on insulin.  He has been taking Xanax as needed for anxiety.    Since I initially saw him in April 2017.  He underwent a sleep study at the [facility].  This was done on 06/29/2015.  This study was consistent with upper airway resistance syndrome with an AHI of 2.6 per hour.  His mean oxygen saturation was 88.47% with a minimum oxygen desaturation to 85%.  There was evidence for soft snoring.  Of note, he was never able to achieve any supine sleep.  He presents for reevaluation.  An Epworth Sleepiness Scale score was calculated today in the office and this endorsed at 9 with a moderate chance of dozing sitting and reading, watching TV, and while lying down to rest in the afternoon and circumstances persist.  He had a slight chance of dozing dozing while sitting inactive in a public place, as a passenger in a car, and sitting quietly after lunch.    Since I last saw him, he admits to still smoking 1 pack per day.  He has not had any chest pain.  He is unaware of any arrhythmias.  He is on placebo 2 capsules twice a day, and Crestor 20 mg for mixed  hyperlipidemia.  He continues to be on lisinopril 20 mg twice a day, carvedilol 25 mg twice a day Lasix as needed in addition to digoxin 0.125 mg for his cardiomyopathy and hypertension.  He is diabetic on metformin and insulin.  He presents for evaluation.  Past Medical History:  Diagnosis Date  . Anxiety   . Cardiomyopathy   . Carpal tunnel syndrome 10/14   right-GSC  . Cataract   . CHF (congestive heart failure) ([place])   . Diabetes mellitus   . Dyslipidemia   . Hypertension   . ICD (implantable cardioverter-defibrillator) in place   . Sleep apnea    was tested many yr ago-does not use a cpap-  . Wears glasses     Past Surgical  History:  Procedure Laterality Date  . CARDIAC DEFIBRILLATOR PLACEMENT  02/01/2004  . CARDIAC DEFIBRILLATOR PLACEMENT  11/14   replaced with new one  . CARPAL TUNNEL RELEASE Left 12/19/2012   Procedure: LEFT CARPAL TUNNEL RELEASE;  Surgeon: Roseanne Kaufman, MD;  Location: [facility];  Service: Orthopedics;  Laterality: Left;  . EYE SURGERY    . IMPLANTABLE CARDIOVERTER DEFIBRILLATOR (ICD) GENERATOR CHANGE N/A 11/21/2012   Procedure: ICD GENERATOR CHANGE;  Surgeon: Evans Lance, MD;  Location: [facility] CATH LAB;  Service: Cardiovascular;  Laterality: N/A;  . TONSILLECTOMY      Allergies  Allergen Reactions  . Codeine Other (See Comments)    Severe HA's.  . Niaspan [Niacin Er] Other (See Comments)    Per pt feels like skin is burning( REDNESS), and pins/needles are sticking   . Other     Other reaction(s): Other (See Comments) Per pt feels like skin is burning( REDNESS), and pins/needles are sticking   . Sulfonamide Derivatives Other (See Comments)    thrush    Current Outpatient Prescriptions  Medication Sig Dispense Refill  . ALPRAZolam (XANAX) 1 MG tablet Take 1 tablet (1 mg total) by mouth 2 (two) times daily as needed for anxiety (for anxiety). 60 tablet 5  . aspirin 81 MG tablet Take 81 mg by mouth daily.     . blood glucose meter kit and supplies KIT Test blood sugar 3 times daily. Dx code: E11.65 1 each 0  . carvedilol (COREG) 25 MG tablet take 1 tablet by mouth twice a day with A MEAL 180 tablet 3  . desvenlafaxine (PRISTIQ) 100 MG 24 hr tablet TAKE 1 TABLET BY MOUTH ONCE DAILY. 90 tablet 3  . digoxin (DIGOX) 0.125 MG tablet Take 1 tablet (125 mcg total) by mouth daily. 90 tablet 3  . furosemide (LASIX) 40 MG tablet Take 1 tablet (40 mg total) by mouth as needed. 30 tablet 6  . insulin NPH-regular Human (HUMULIN 70/30) (70-30) 100 UNIT/ML injection inject 65 units subcutaneously every morning and inject 50-55 units subcutaneously every evening 40 vial 11  .  lisinopril (PRINIVIL,ZESTRIL) 20 MG tablet Take 1 tablet (20 mg total) by mouth 2 (two) times daily. 180 tablet 3  . metFORMIN (GLUCOPHAGE-XR) 500 MG 24 hr tablet Take 1 tablet by mouth 2 (two) times daily.  0  . rosuvastatin (CRESTOR) 20 MG tablet take 1 tablet by mouth once daily 30 tablet 8  . VASCEPA 1 g CAPS Take 2 capsules by mouth 2 (two) times daily. 120 capsule 11   No current facility-administered medications for this visit.     Social History   Social History  . Marital status: Widowed    Spouse name: N/A  . Number  of children: 0  . Years of education: N/A   Occupational History  . maintenance Unemployed   Social History Main Topics  . Smoking status: Current Every Day Smoker    Packs/day: 1.50    Types: Cigarettes  . Smokeless tobacco: Never Used  . Alcohol use No  . Drug use: No  . Sexual activity: No     Comment: number of sex partners in the last 12 months  0   Other Topics Concern  . Not on file   Social History Narrative  . No narrative on file   Additional social history is notable that he is widowed for 9 years.  He lives by himself.  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  He completed 12th grade of education.  He continues to smoke cigarettes and has been smoking since age 82.  He does drink occasional beer.  He walks intermittently outside work.  Family History  Problem Relation Age of Onset  . Coronary artery disease Father        CABG  . Cancer Mother        colon  . Heart disease Brother   . Cancer Unknown    Additional family history is that his mother died at 44 secondary to cancer.  His father is deceased and had undergone CABG revascularization surgery.  His 2 brothers, ages 81 and 60.  ROS General: Negative; No fevers, chills, or night sweats HEENT: Negative; No changes in vision or hearing, sinus congestion, difficulty swallowing Pulmonary: Negative; No cough, wheezing, shortness of breath,  hemoptysis Cardiovascular:  See HPI; No chest pain, presyncope, syncope, palpitations, edema GI: Negative; No nausea, vomiting, diarrhea, or abdominal pain GU: Negative; No dysuria, hematuria, or difficulty voiding Musculoskeletal: Negative; no myalgias, joint pain, or weakness Hematologic/Oncologic: Negative; no easy bruising, bleeding Endocrine: Negative; no heat/cold intolerance; no diabetes Neuro: Negative; no changes in balance, headaches Skin: Negative; No rashes or skin lesions Psychiatric: Positive for anxiety and depression Sleep: Positive for poor sleep, snoring, nonrestorative sleep, and daytime sleepiness.; No  bruxism, restless legs, hypnogagnic hallucinations Other comprehensive 14 point system review is negative   Physical Exam BP 116/80   Pulse 69   Ht '5\' 8"'$  (1.727 m)   Wt 195 lb (88.5 kg)   BMI 29.65 kg/m    Repeat blood pressure by me 122/70  Wt Readings from Last 3 Encounters:  06/01/16 195 lb (88.5 kg)  03/30/16 196 lb (88.9 kg)  08/24/15 198 lb 12.8 oz (90.2 kg)    General: Alert, oriented, no distress.  Skin: normal turgor, no rashes, warm and dry HEENT: Normocephalic, atraumatic. Pupils equal round and reactive to light; sclera anicteric; extraocular muscles intact; Fundi previously noted mild arterial narrowing Nose without nasal septal hypertrophy Mouth/Parynx benign; Mallinpatti scale 3 Neck: No JVD, no carotid bruits; normal carotid upstroke Lungs: clear to ausculatation and percussion; no wheezing or rales Chest wall: without tenderness to palpitation Heart: PMI not displaced, RRR, s1 s2 normal, 1/6 systolic murmur, no diastolic murmur, no rubs, gallops, thrills, or heaves Abdomen: soft, nontender; no hepatosplenomehaly, BS+; abdominal aorta nontender and not dilated by palpation. Back: no CVA tenderness Pulses 2+ Musculoskeletal: full range of motion, normal strength, no joint deformities Extremities: no clubbing cyanosis or edema, Homan's sign  negative  Neurologic: grossly nonfocal; Cranial nerves grossly wnl Psychologic: Normal mood and affect  ECG (independently read by me):normal sinus rhythm at 69 bpm.  Normal intervals.  No ectopy.   April 2017  ECG (independently read by me): Normal sinus rhythm at 73 bpm.  Borderline voltage criteria for LVH.  LABS:  BMP Latest Ref Rng & Units 05/24/2015 02/11/2015 06/23/2014  Glucose 65 - 99 mg/dL 93 163(H) 137(H)  BUN 7 - 25 mg/dL '16 15 14  '$ Creatinine 0.70 - 1.33 mg/dL 0.78 0.84 0.81  Sodium 135 - 146 mmol/L 140 140 141  Potassium 3.5 - 5.3 mmol/L 4.5 4.3 4.1  Chloride 98 - 110 mmol/L 107 105 109  CO2 20 - 31 mmol/L '25 25 22  '$ Calcium 8.6 - 10.3 mg/dL 8.7 8.7 8.9     Hepatic Function Latest Ref Rng & Units 05/24/2015 02/11/2015 06/23/2014  Total Protein 6.1 - 8.1 g/dL 6.9 6.6 6.6  Albumin 3.6 - 5.1 g/dL 4.3 4.1 4.1  AST 10 - 35 U/L '31 31 31  '$ ALT 9 - 46 U/L 42 51(H) 53  Alk Phosphatase 40 - 115 U/L 59 68 58  Total Bilirubin 0.2 - 1.2 mg/dL 0.7 0.6 0.5    CBC Latest Ref Rng & Units 03/30/2016 05/24/2015 02/11/2015  WBC 4.6 - 10.2 K/uL 8.2 8.1 6.6  Hemoglobin 14.1 - 18.1 g/dL 17.2 17.9(H) 17.2(H)  Hematocrit 43.5 - 53.7 % 47.7 52.0(H) 48.9  Platelets 140 - 400 K/uL - 160 138(L)   Lab Results  Component Value Date   MCV 88.3 03/30/2016   MCV 89.5 05/24/2015   MCV 90.7 02/11/2015   Lab Results  Component Value Date   TSH 0.800 09/05/2011   Lab Results  Component Value Date   HGBA1C 7.5 05/04/2015     BNP    Component Value Date/Time   BNP 11.9 05/24/2015 1036    ProBNP No results found for: PROBNP   Lipid Panel     Component Value Date/Time   CHOL 83 (L) 05/06/2015 1723   TRIG 181 (H) 05/06/2015 1723   HDL 32 (L) 05/06/2015 1723   CHOLHDL 2.6 05/06/2015 1723   CHOLHDL 2.7 02/11/2015 0958   VLDL 45 (H) 02/11/2015 0958   LDLCALC 15 05/06/2015 1723    RADIOLOGY: No results found.  IMPRESSION:  1. NICM (nonischemic cardiomyopathy) ([place])   2. Nicotine use  disorder   3. UARS (upper airway resistance syndrome)   4. Chronic combined systolic and diastolic congestive heart failure ([place])   5. Automatic implantable cardioverter-defibrillator in situ   6. Anxiety state    ASSESSMENT AND PLAN: [name] is a 51 year old gentleman who has a long-standing history of tobacco use and continues to smoke cigarettes.  He underwent initial defibrillator insertion in 5329 for systolic heart failure and in 2014 underwent an ICD upgrade generator change.  His last echo Doppler study was done by [facility] in 2015 which showed an ejection fraction at a  ~ 45% with LVH and grade 1 diastolic dysfunction.  Presently his blood pressure is controlled on carvedilol 25 mg twice a day, lisinopril 40 mg in addition to his as needed furosemide.  He continues to be on digoxin at 125 g. He has a history of mixed hyperlipidemia and is now on Vascepa 2 capsules twice a day in addition to Crestor 20 mg daily.  Although remotely, he may been told of having sleep apnea, on his sleep study last year, he did not meet criteria for CPAP therapy and his overall AHI was 2.6, with absence of supine sleep.  He did have reduced oxygen saturation with a nadir of 85%.  I again discussed the importance of smoking cessation, which  may be contributing to his oxygen desaturation.  He does not sleep on his back.  It is possible that if he did have supine sleep his AHI would be consistent with sleep apnea but this was not demonstrated on his most recent sleep study.  At present, his blood pressure is controlled.  He tells me his primary physician will be checking laboratory.  I will try to obtain these results.  He has not had an echo Doppler study in several years.  Prior to his next evaluation.  I will recheck an echo study.  He may be a candidate to switch from ace inhibition to entrust oh but this would dependent upon his LV function and Heart Association class.  I will see him in follow-up  of his echo Doppler study and further recommendations will be made at that time.  Time spent: 25 minutes   Troy Sine, MD, [facility] 06/03/2016 12:35 PM

## 2016-06-01 NOTE — Patient Instructions (Signed)
Your physician has requested that you have an echocardiogram in October .Echocardiography is a painless test that uses sound waves to create images of your heart. It provides your doctor with information about the size and shape of your heart and how well your heart's chambers and valves are working. This procedure takes approximately one hour. There are no restrictions for this procedure.  Your physician recommends that you schedule a follow-up appointment in: November.

## 2016-06-13 ENCOUNTER — Other Ambulatory Visit: Payer: Self-pay | Admitting: Internal Medicine

## 2016-06-16 ENCOUNTER — Other Ambulatory Visit: Payer: Self-pay | Admitting: Cardiovascular Disease

## 2016-06-25 ENCOUNTER — Ambulatory Visit (INDEPENDENT_AMBULATORY_CARE_PROVIDER_SITE_OTHER): Payer: 59 | Admitting: Internal Medicine

## 2016-06-25 ENCOUNTER — Encounter: Payer: Self-pay | Admitting: Internal Medicine

## 2016-06-25 VITALS — BP 118/70 | HR 74 | Ht 68.0 in | Wt 192.0 lb

## 2016-06-25 DIAGNOSIS — I428 Other cardiomyopathies: Secondary | ICD-10-CM | POA: Diagnosis not present

## 2016-06-25 DIAGNOSIS — Z95810 Presence of automatic (implantable) cardiac defibrillator: Secondary | ICD-10-CM | POA: Diagnosis not present

## 2016-06-25 LAB — CUP PACEART INCLINIC DEVICE CHECK
Date Time Interrogation Session: 20180611040000
HIGH POWER IMPEDANCE MEASURED VALUE: 58 Ohm
HighPow Impedance: 50 Ohm
Implantable Lead Location: 753860
Implantable Pulse Generator Implant Date: 20141107
Lead Channel Impedance Value: 500 Ohm
Lead Channel Sensing Intrinsic Amplitude: 12.1 mV
Lead Channel Setting Pacing Amplitude: 2.6 V
Lead Channel Setting Pacing Pulse Width: 0.8 ms
MDC IDC LEAD IMPLANT DT: 20060117
MDC IDC LEAD SERIAL: 124785
MDC IDC MSMT LEADCHNL RV PACING THRESHOLD AMPLITUDE: 1.3 V
MDC IDC MSMT LEADCHNL RV PACING THRESHOLD PULSEWIDTH: 1 ms
MDC IDC SET LEADCHNL RV SENSING SENSITIVITY: 0.6 mV
Pulse Gen Serial Number: 126350

## 2016-06-25 MED ORDER — LISINOPRIL 20 MG PO TABS: 20.0000 mg | ORAL_TABLET | Freq: Two times a day (BID) | ORAL | 3 refills | Status: DC

## 2016-06-25 MED ORDER — DIGOXIN 125 MCG PO TABS: 125.0000 ug | ORAL_TABLET | Freq: Every day | ORAL | 3 refills | Status: DC

## 2016-06-25 MED ORDER — DIGOXIN 125 MCG PO TABS: 125.0000 ug | ORAL_TABLET | Freq: Every day | ORAL | 0 refills | Status: DC

## 2016-06-25 MED ORDER — LISINOPRIL 20 MG PO TABS: 20.0000 mg | ORAL_TABLET | Freq: Two times a day (BID) | ORAL | 0 refills | Status: DC

## 2016-06-25 NOTE — Patient Instructions (Addendum)
Medication Instructions:  Your physician recommends that you continue on your current medications as directed. Please refer to the Current Medication list given to you today.   Labwork: None Ordered   Testing/Procedures: None Ordered   Follow-Up: Your physician wants you to follow-up in: 1 year with Dr. Lovena Le. You will receive a reminder letter in the mail two months in advance. If you don't receive a letter, please call our office to schedule the follow-up appointment.  Remote monitoring is used to monitor your ICD from home. This monitoring reduces the number of office visits required to check your device to one time per year. It allows [place] to keep an eye on the functioning of your device to ensure it is working properly. You are scheduled for a device check from home on  09/24/16 . You may send your transmission at any time that day. If you have a wireless device, the transmission will be sent automatically. After your physician reviews your transmission, you will receive a postcard with your next transmission date.    Any Other Special Instructions Will Be Listed Below (If Applicable). If you have prolonged palpitations, please call our office - [phone]. You will need to come in to our office for a 12 lead EKG.     If you need a refill on your cardiac medications before your next appointment, please call your pharmacy.

## 2016-06-25 NOTE — Progress Notes (Signed)
HPI [name] returns today for followup. He is a very pleasant 51 year old man with chronic systolic heart failure, class II, a nonischemic cardiomyopathy, status post ICD implantation. In the interim, he has done well. He continues to work full-time and denies chest pain or shortness of breath. He has had some palpitations. No syncope. No edema.   Allergies  Allergen Reactions  . Codeine Other (See Comments)    Severe HA's.  . Niaspan [Niacin Er] Other (See Comments)    Per pt feels like skin is burning( REDNESS), and pins/needles are sticking   . Other     Other reaction(s): Other (See Comments) Per pt feels like skin is burning( REDNESS), and pins/needles are sticking   . Sulfonamide Derivatives Other (See Comments)    thrush     Current Outpatient Prescriptions  Medication Sig Dispense Refill  . ALPRAZolam (XANAX) 1 MG tablet Take 1 tablet (1 mg total) by mouth 2 (two) times daily as needed for anxiety (for anxiety). 60 tablet 5  . aspirin 81 MG tablet Take 81 mg by mouth daily.     . blood glucose meter kit and supplies KIT Test blood sugar 3 times daily. Dx code: E11.65 1 each 0  . carvedilol (COREG) 25 MG tablet take 1 tablet by mouth twice a day WITH A MEAL 180 tablet 1  . desvenlafaxine (PRISTIQ) 100 MG 24 hr tablet TAKE 1 TABLET BY MOUTH ONCE DAILY. 90 tablet 3  . digoxin (DIGOX) 0.125 MG tablet Take 1 tablet (125 mcg total) by mouth daily. Please keep upcoming appointment 6/11 at 8:15 am for additional refills thanks. 90 tablet 0  . furosemide (LASIX) 40 MG tablet Take 1 tablet (40 mg total) by mouth as needed. 30 tablet 6  . insulin NPH-regular Human (HUMULIN 70/30) (70-30) 100 UNIT/ML injection inject 65 units subcutaneously every morning and inject 50-55 units subcutaneously every evening 40 vial 11  . lisinopril (PRINIVIL,ZESTRIL) 20 MG tablet Take 1 tablet (20 mg total) by mouth 2 (two) times daily. Please keep upcoming appointment 6/11 at 8:15 am for additional  refills thanks. 180 tablet 0  . metFORMIN (GLUCOPHAGE-XR) 500 MG 24 hr tablet Take 1 tablet by mouth 2 (two) times daily.  0  . rosuvastatin (CRESTOR) 20 MG tablet take 1 tablet by mouth once daily 30 tablet 8  . VASCEPA 1 g CAPS Take 2 capsules by mouth 2 (two) times daily. 120 capsule 11   No current facility-administered medications for this visit.      Past Medical History:  Diagnosis Date  . Anxiety   . Cardiomyopathy   . Carpal tunnel syndrome 10/14   right-GSC  . Cataract   . CHF (congestive heart failure) ([place])   . Diabetes mellitus   . Dyslipidemia   . Hypertension   . ICD (implantable cardioverter-defibrillator) in place   . Sleep apnea    was tested many yr ago-does not use a cpap-  . Wears glasses     ROS:   All systems reviewed and negative except as noted in the HPI.   Past Surgical History:  Procedure Laterality Date  . CARDIAC DEFIBRILLATOR PLACEMENT  02/01/2004  . CARDIAC DEFIBRILLATOR PLACEMENT  11/14   replaced with new one  . CARPAL TUNNEL RELEASE Left 12/19/2012   Procedure: LEFT CARPAL TUNNEL RELEASE;  Surgeon: Roseanne Kaufman, MD;  Location: [facility];  Service: Orthopedics;  Laterality: Left;  . EYE SURGERY    . IMPLANTABLE CARDIOVERTER DEFIBRILLATOR (ICD) GENERATOR CHANGE  N/A 11/21/2012   Procedure: ICD GENERATOR CHANGE;  Surgeon: Evans Lance, MD;  Location: [facility] CATH LAB;  Service: Cardiovascular;  Laterality: N/A;  . TONSILLECTOMY       Family History  Problem Relation Age of Onset  . Coronary artery disease Father        CABG  . Cancer Mother        colon  . Heart disease Brother   . Cancer Unknown      Social History   Social History  . Marital status: Widowed    Spouse name: N/A  . Number of children: 0  . Years of education: N/A   Occupational History  . maintenance Unemployed   Social History Main Topics  . Smoking status: Current Every Day Smoker    Packs/day: 1.50    Types: Cigarettes  . Smokeless  tobacco: Never Used  . Alcohol use No  . Drug use: No  . Sexual activity: No     Comment: number of sex partners in the last 12 months  0   Other Topics Concern  . Not on file   Social History Narrative  . No narrative on file     BP 118/70   Pulse 74   Ht '5\' 8"'$  (1.727 m)   Wt 192 lb (87.1 kg)   SpO2 96%   BMI 29.19 kg/m   Physical Exam:  Well appearing 51 year old man, NAD HEENT: Unremarkable Neck:  6 cm JVD, no thyromegally Lungs:  Clear with no wheezes, rales, or rhonchi. HEART:  Regular rate rhythm, no murmurs, no rubs, no clicks Abd:  soft, positive bowel sounds, no organomegally, no rebound, no guarding Ext:  2 plus pulses, no edema, no cyanosis, no clubbing Skin:  No rashes no nodules Neuro:  CN II through XII intact, motor grossly intact   DEVICE  Normal device function.  See PaceArt for details.   Assess/Plan:  1. Chronic systolic heart failure - his symptoms are class 1. He is encouraged to increase his physical activity and maintain a low sodium diet. 2. VT - he has had no recurrent ICD therapies. He does have evidence of NSVT. 3. ICD - his [employer] device is working normally. Will recheck in several months. 4. Palpitations - it is unclear to me if these are NSVT or PVC's or something else. I have asked him to come in for an ECG if his palpitations are sustained.   Mikle Bosworth.D.

## 2016-07-10 ENCOUNTER — Ambulatory Visit
Admission: RE | Admit: 2016-07-10 | Discharge: 2016-07-10 | Disposition: A | Discharge status: Home / Self Care | Payer: Worker's Compensation | Source: Ambulatory Visit | Attending: Nurse Practitioner | Admitting: Nurse Practitioner

## 2016-07-10 ENCOUNTER — Other Ambulatory Visit: Payer: Self-pay | Admitting: Nurse Practitioner

## 2016-07-10 DIAGNOSIS — M25562 Pain in left knee: Secondary | ICD-10-CM

## 2016-07-19 ENCOUNTER — Other Ambulatory Visit: Payer: Self-pay | Admitting: Cardiovascular Disease

## 2016-08-08 ENCOUNTER — Other Ambulatory Visit: Payer: Self-pay | Admitting: Cardiovascular Disease

## 2016-08-24 DIAGNOSIS — I10 Essential (primary) hypertension: Secondary | ICD-10-CM | POA: Diagnosis not present

## 2016-08-24 DIAGNOSIS — E1165 Type 2 diabetes mellitus with hyperglycemia: Secondary | ICD-10-CM | POA: Diagnosis not present

## 2016-08-24 DIAGNOSIS — E781 Pure hyperglyceridemia: Secondary | ICD-10-CM | POA: Diagnosis not present

## 2016-09-24 ENCOUNTER — Ambulatory Visit (INDEPENDENT_AMBULATORY_CARE_PROVIDER_SITE_OTHER): Payer: 59 | Admitting: *Deleted

## 2016-09-24 DIAGNOSIS — I428 Other cardiomyopathies: Secondary | ICD-10-CM

## 2016-09-24 NOTE — Progress Notes (Signed)
Remote ICD transmission.   

## 2016-09-26 ENCOUNTER — Encounter: Payer: Self-pay | Admitting: Cardiology

## 2016-09-26 LAB — CUP PACEART REMOTE DEVICE CHECK
Battery Remaining Longevity: 108 mo
Date Time Interrogation Session: 20180910090700
HighPow Impedance: 67 Ohm
Implantable Lead Implant Date: 20060117
Implantable Lead Location: 753860
Implantable Lead Model: 185
Implantable Pulse Generator Implant Date: 20141107
Lead Channel Pacing Threshold Pulse Width: 1 ms
Lead Channel Setting Pacing Amplitude: 2.6 V
Lead Channel Setting Pacing Pulse Width: 0.8 ms
MDC IDC LEAD SERIAL: 124785
MDC IDC MSMT BATTERY REMAINING PERCENTAGE: 100 %
MDC IDC MSMT LEADCHNL RV IMPEDANCE VALUE: 564 Ohm
MDC IDC MSMT LEADCHNL RV PACING THRESHOLD AMPLITUDE: 1.3 V
MDC IDC PG SERIAL: 126350
MDC IDC SET LEADCHNL RV SENSING SENSITIVITY: 0.6 mV
MDC IDC STAT BRADY RV PERCENT PACED: 0 %

## 2016-10-12 DIAGNOSIS — E113291 Type 2 diabetes mellitus with mild nonproliferative diabetic retinopathy without macular edema, right eye: Secondary | ICD-10-CM | POA: Diagnosis not present

## 2016-10-12 DIAGNOSIS — E113299 Type 2 diabetes mellitus with mild nonproliferative diabetic retinopathy without macular edema, unspecified eye: Secondary | ICD-10-CM | POA: Diagnosis not present

## 2016-10-12 DIAGNOSIS — Z9841 Cataract extraction status, right eye: Secondary | ICD-10-CM | POA: Diagnosis not present

## 2016-10-15 ENCOUNTER — Other Ambulatory Visit: Payer: Self-pay

## 2016-10-15 ENCOUNTER — Ambulatory Visit (HOSPITAL_COMMUNITY): Payer: 59 | Attending: Internal Medicine

## 2016-10-15 DIAGNOSIS — Z95810 Presence of automatic (implantable) cardiac defibrillator: Secondary | ICD-10-CM | POA: Diagnosis not present

## 2016-10-15 DIAGNOSIS — I428 Other cardiomyopathies: Secondary | ICD-10-CM

## 2016-10-15 DIAGNOSIS — I429 Cardiomyopathy, unspecified: Secondary | ICD-10-CM | POA: Diagnosis present

## 2016-10-15 DIAGNOSIS — E785 Hyperlipidemia, unspecified: Secondary | ICD-10-CM | POA: Insufficient documentation

## 2016-10-15 DIAGNOSIS — I119 Hypertensive heart disease without heart failure: Secondary | ICD-10-CM | POA: Diagnosis not present

## 2016-10-15 DIAGNOSIS — E119 Type 2 diabetes mellitus without complications: Secondary | ICD-10-CM | POA: Diagnosis not present

## 2016-10-19 DIAGNOSIS — Z23 Encounter for immunization: Secondary | ICD-10-CM | POA: Diagnosis not present

## 2016-10-23 ENCOUNTER — Other Ambulatory Visit: Payer: Self-pay

## 2016-10-23 ENCOUNTER — Emergency Department (HOSPITAL_BASED_OUTPATIENT_CLINIC_OR_DEPARTMENT_OTHER)
Admission: EM | Admit: 2016-10-23 | Discharge: 2016-10-24 | Disposition: A | Discharge status: Home / Self Care | Payer: Worker's Compensation | Attending: Emergency Medicine | Admitting: Emergency Medicine

## 2016-10-23 ENCOUNTER — Emergency Department (HOSPITAL_BASED_OUTPATIENT_CLINIC_OR_DEPARTMENT_OTHER): Payer: Worker's Compensation

## 2016-10-23 ENCOUNTER — Encounter (HOSPITAL_BASED_OUTPATIENT_CLINIC_OR_DEPARTMENT_OTHER): Payer: Self-pay | Admitting: *Deleted

## 2016-10-23 DIAGNOSIS — Z79899 Other long term (current) drug therapy: Secondary | ICD-10-CM | POA: Insufficient documentation

## 2016-10-23 DIAGNOSIS — R200 Anesthesia of skin: Secondary | ICD-10-CM

## 2016-10-23 DIAGNOSIS — F17210 Nicotine dependence, cigarettes, uncomplicated: Secondary | ICD-10-CM | POA: Insufficient documentation

## 2016-10-23 DIAGNOSIS — E119 Type 2 diabetes mellitus without complications: Secondary | ICD-10-CM | POA: Insufficient documentation

## 2016-10-23 DIAGNOSIS — J449 Chronic obstructive pulmonary disease, unspecified: Secondary | ICD-10-CM | POA: Diagnosis not present

## 2016-10-23 DIAGNOSIS — Z7982 Long term (current) use of aspirin: Secondary | ICD-10-CM | POA: Insufficient documentation

## 2016-10-23 DIAGNOSIS — I509 Heart failure, unspecified: Secondary | ICD-10-CM | POA: Diagnosis not present

## 2016-10-23 DIAGNOSIS — M25512 Pain in left shoulder: Secondary | ICD-10-CM | POA: Insufficient documentation

## 2016-10-23 DIAGNOSIS — R202 Paresthesia of skin: Secondary | ICD-10-CM

## 2016-10-23 DIAGNOSIS — Z794 Long term (current) use of insulin: Secondary | ICD-10-CM | POA: Insufficient documentation

## 2016-10-23 DIAGNOSIS — I110 Hypertensive heart disease with heart failure: Secondary | ICD-10-CM | POA: Diagnosis not present

## 2016-10-23 LAB — COMPREHENSIVE METABOLIC PANEL
ALBUMIN: 3.9 g/dL (ref 3.5–5.0)
ALT: 37 U/L (ref 17–63)
AST: 30 U/L (ref 15–41)
Alkaline Phosphatase: 73 U/L (ref 38–126)
Anion gap: 8 (ref 5–15)
BUN: 20 mg/dL (ref 6–20)
CHLORIDE: 107 mmol/L (ref 101–111)
CO2: 22 mmol/L (ref 22–32)
CREATININE: 0.82 mg/dL (ref 0.61–1.24)
Calcium: 8.6 mg/dL — ABNORMAL LOW (ref 8.9–10.3)
GFR calc Af Amer: >60 mL/min (ref >60)
GFR calc non Af Amer: >60 mL/min (ref >60)
Glucose, Bld: 107 mg/dL — ABNORMAL HIGH (ref 65–99)
Potassium: 3.8 mmol/L (ref 3.5–5.1)
SODIUM: 137 mmol/L (ref 135–145)
Total Bilirubin: 0.5 mg/dL (ref 0.3–1.2)
Total Protein: 7.3 g/dL (ref 6.5–8.1)

## 2016-10-23 LAB — CBC WITH DIFFERENTIAL/PLATELET
BASOS ABS: 0 10*3/uL (ref 0.0–0.1)
BASOS PCT: 0 %
EOS PCT: 2 %
Eosinophils Absolute: 0.2 10*3/uL (ref 0.0–0.7)
HCT: 47.9 % (ref 39.0–52.0)
Hemoglobin: 16.6 g/dL (ref 13.0–17.0)
LYMPHS PCT: 30 %
Lymphs Abs: 2.5 10*3/uL (ref 0.7–4.0)
MCH: 31 pg (ref 26.0–34.0)
MCHC: 34.7 g/dL (ref 30.0–36.0)
MCV: 89.4 fL (ref 78.0–100.0)
Monocytes Absolute: 0.8 10*3/uL (ref 0.1–1.0)
Monocytes Relative: 9 %
Neutro Abs: 4.9 10*3/uL (ref 1.7–7.7)
Neutrophils Relative %: 59 %
PLATELETS: 177 10*3/uL (ref 150–400)
RBC: 5.36 MIL/uL (ref 4.22–5.81)
RDW: 13.1 % (ref 11.5–15.5)
WBC: 8.5 10*3/uL (ref 4.0–10.5)

## 2016-10-23 LAB — TROPONIN I

## 2016-10-23 MED ORDER — IBUPROFEN 800 MG PO TABS
800.0000 mg | ORAL_TABLET | Freq: Once | ORAL | Status: AC
  Administered 2016-10-23: 800 mg via ORAL
  Filled 2016-10-23: qty 1

## 2016-10-23 NOTE — ED Provider Notes (Signed)
[facility] [facility] DEPT [facility] Provider Note   CSN: [id number] Arrival date & time: 10/23/16  2102     History   Chief Complaint No chief complaint on file.   HPI Justin Ray is a 51 y.o. male with history of anxiety, cardiomyopathy s/p ICD, CHF, DM, dyslipidemia, HTN who presents today with chief complaint acute onset, constant left shoulder pain and left finger numbness and tingling. He states that at around 7 PM earlier today he was at work when he began developing sharp stabbing pain "deep in my shoulder blade". He also endorses numbness of his fingers and tingling of his fingertips in the left hand. Denies chest pain, shortness of breath, vision changes, headache, weakness, abdominal pain, nausea, vomiting, neck pain. States he has never experienced pain like this in the past. He does have a right rotator cuff injury. He denies recent trauma. He does state that he does do a lot of heavy lifting at work. No aggravating or alleviating factors noted. He has not tried anything for his symptoms. He told his employer/supervisor about his symptoms and they recommended presentation to the ED for evaluation. He is a current 1.5 ppd smoker.   The history is provided by the patient.    Past Medical History:  Diagnosis Date  . Anxiety   . Cardiomyopathy   . Carpal tunnel syndrome 10/14   right-GSC  . Cataract   . CHF (congestive heart failure) ([place])   . Diabetes mellitus   . Dyslipidemia   . Hypertension   . ICD (implantable cardioverter-defibrillator) in place   . Sleep apnea    was tested many yr ago-does not use a cpap-  . Wears glasses     Patient Active Problem List   Diagnosis Date Noted  . UARS (upper airway resistance syndrome) 08/26/2015  . Hyperlipidemia, mixed 08/26/2015  . OSA (obstructive sleep apnea) 05/08/2015  . Polycythemia 05/08/2015  . Dyslipidemia-primarily triglycerides 09/29/2014  . Chronic systolic heart failure ([place]) 04/14/2013  . Anxiety state 09/05/2011  .  Dysthymic disorder 02/28/2011  . COPD with asthma ([place]) 02/28/2011  . Nicotine use disorder 02/28/2011  . Insomnia 02/28/2011  . Automatic implantable cardioverter-defibrillator in situ 02/06/2011  . DM (diabetes mellitus), type 2, uncontrolled ([place]) 01/26/2009  . NICM (nonischemic cardiomyopathy) ([place]) 01/26/2009  . Congestive heart failure ([place]) 01/26/2009    Past Surgical History:  Procedure Laterality Date  . CARDIAC DEFIBRILLATOR PLACEMENT  02/01/2004  . CARDIAC DEFIBRILLATOR PLACEMENT  11/14   replaced with new one  . CARPAL TUNNEL RELEASE Left 12/19/2012   Procedure: LEFT CARPAL TUNNEL RELEASE;  Surgeon: Roseanne Kaufman, MD;  Location: [facility];  Service: Orthopedics;  Laterality: Left;  . EYE SURGERY    . IMPLANTABLE CARDIOVERTER DEFIBRILLATOR (ICD) GENERATOR CHANGE N/A 11/21/2012   Procedure: ICD GENERATOR CHANGE;  Surgeon: Evans Lance, MD;  Location: [facility] CATH LAB;  Service: Cardiovascular;  Laterality: N/A;  . TONSILLECTOMY         Home Medications    Prior to Admission medications   Medication Sig Start Date End Date Taking? Authorizing Provider  ALPRAZolam Duanne Moron) 1 MG tablet Take 1 tablet (1 mg total) by mouth 2 (two) times daily as needed for anxiety (for anxiety). 05/04/15   Leandrew Koyanagi, MD  aspirin 81 MG tablet Take 81 mg by mouth daily.     [provider]  blood glucose meter kit and supplies KIT Test blood sugar 3 times daily. Dx code: E11.65 04/05/14   Tami Lin  P, MD  carvedilol (COREG) 25 MG tablet take 1 tablet by mouth twice a day WITH A MEAL 06/18/16   Troy Sine, MD  desvenlafaxine (PRISTIQ) 100 MG 24 hr tablet TAKE 1 TABLET BY MOUTH ONCE DAILY. 05/04/15   Leandrew Koyanagi, MD  digoxin (DIGOX) 0.125 MG tablet Take 1 tablet (125 mcg total) by mouth daily. 06/25/16   Evans Lance, MD  furosemide (LASIX) 40 MG tablet Take 1 tablet (40 mg total) by mouth as needed. 05/04/15   Leandrew Koyanagi, MD  insulin  NPH-regular Human (HUMULIN 70/30) (70-30) 100 UNIT/ML injection inject 65 units subcutaneously every morning and inject 50-55 units subcutaneously every evening 05/04/15   Leandrew Koyanagi, MD  lisinopril (PRINIVIL,ZESTRIL) 20 MG tablet Take 1 tablet (20 mg total) by mouth 2 (two) times daily. 06/25/16   Evans Lance, MD  metFORMIN (GLUCOPHAGE-XR) 500 MG 24 hr tablet Take 1 tablet by mouth 2 (two) times daily. 05/18/16   [provider]  rosuvastatin (CRESTOR) 20 MG tablet take 1 tablet by mouth once daily 07/19/16   Troy Sine, MD  VASCEPA 1 g CAPS take 2 capsules by mouth twice a day 08/08/16   Troy Sine, MD    Family History Family History  Problem Relation Age of Onset  . Coronary artery disease Father        CABG  . Cancer Mother        colon  . Heart disease Brother   . Cancer Unknown     Social History Social History  Substance Use Topics  . Smoking status: Current Every Day Smoker    Packs/day: 1.50    Types: Cigarettes  . Smokeless tobacco: Never Used  . Alcohol use No     Allergies   Codeine; Niaspan [niacin er]; Other; and Sulfonamide derivatives   Review of Systems Review of Systems  Constitutional: Negative for chills, diaphoresis, fatigue and fever.  Respiratory: Negative for shortness of breath.   Cardiovascular: Negative for chest pain.  Gastrointestinal: Negative for abdominal pain, diarrhea, nausea and vomiting.  Musculoskeletal: Positive for arthralgias (L shoulder).  Neurological: Positive for numbness. Negative for syncope, weakness, light-headedness and headaches.  All other systems reviewed and are negative.    Physical Exam Updated Vital Signs BP 127/72   Pulse 78   Resp 17   Ht '5\' 8"'$  (1.727 m)   Wt 85.7 kg (189 lb)   SpO2 94%   BMI 28.74 kg/m   Physical Exam  Constitutional: He appears well-developed and well-nourished. No distress.  HENT:  Head: Normocephalic and atraumatic.  Eyes: Pupils are equal, round, and  reactive to light. Conjunctivae and EOM are normal. Right eye exhibits no discharge. Left eye exhibits no discharge.  Neck: Normal range of motion. Neck supple. No JVD present. No tracheal deviation present.  No midline spine TTP, no paraspinal muscle tenderness, no deformity, crepitus, or step-off noted   Cardiovascular: Normal rate, regular rhythm and intact distal pulses.   Pulmonary/Chest: Effort normal and breath sounds normal. No respiratory distress. He has no wheezes. He has no rales. He exhibits no tenderness.  Abdominal: Soft. Bowel sounds are normal. He exhibits no distension. There is no tenderness.  Musculoskeletal: Normal range of motion. He exhibits tenderness. He exhibits no edema.  No midline spine TTP, no paraspinal muscle tenderness, no deformity, crepitus, or step-off noted. Pain elicited with Hawkins and ears exam of the left shoulder. Otherwise no tenderness to palpation of the left shoulder.  Normal range of motion, no arc of pain. 5/5 strength of BUE major muscle groups. Negative Tinel's and Phalens.   Neurological: He is alert. A sensory deficit is present.  Fluent speech, no facial droop, altered sensation of the digits of the left hand as compared to the right. Ambulates without difficulty, and no difficulty with heel walk or toe walk.  Skin: Skin is warm and dry. No erythema.  Psychiatric: He has a normal mood and affect. His behavior is normal.  Nursing note and vitals reviewed.    ED Treatments / Results  Labs (all labs ordered are listed, but only abnormal results are displayed) Labs Reviewed  COMPREHENSIVE METABOLIC PANEL - Abnormal; Notable for the following:       Result Value   Glucose, Bld 107 (*)    Calcium 8.6 (*)    All other components within normal limits  TROPONIN I  CBC WITH DIFFERENTIAL/PLATELET    EKG  EKG Interpretation  Date/Time:  Tuesday October 23 2016 21:16:25 EDT Ventricular Rate:  84 PR Interval:  152 QRS Duration: 90 QT  Interval:  360 QTC Calculation: 425 R Axis:   59 Text Interpretation:  Normal sinus rhythm Normal ECG No significant change was found Confirmed by Shanon Rosser [phone]) on 10/23/2016 11:15:46 PM       Radiology Dg Chest 2 View  Result Date: 10/24/2016 CLINICAL DATA:  LEFT scapular pain radiating to LEFT chest wall, sudden onset last night at work, history hypertension, diabetes mellitus, CHF, smoker EXAM: CHEST  2 VIEW COMPARISON:  03/30/2016 FINDINGS: LEFT subclavian AICD with leads projecting at RIGHT ventricle. Upper normal heart size. Mediastinal contours and pulmonary vascularity normal. Lungs clear. No pleural effusion or pneumothorax. Osseous structures unremarkable. IMPRESSION: No acute abnormalities. Electronically Signed   By: Lavonia Dana M.D.   On: 10/24/2016 00:35   Dg Shoulder Left  Result Date: 10/24/2016 CLINICAL DATA:  LEFT scapular pain with sudden onset last night EXAM: LEFT SHOULDER - 2+ VIEW COMPARISON:  None FINDINGS: Osseous mineralization normal. AC joint alignment normal. Portions of LEFT shoulder are obscured on AP view by defibrillator generator. No acute fracture, dislocation, or bone destruction. IMPRESSION: No acute osseous abnormalities. Electronically Signed   By: Lavonia Dana M.D.   On: 10/24/2016 00:38    Procedures Procedures (including critical care time)  Medications Ordered in ED Medications  ibuprofen (ADVIL,MOTRIN) tablet 800 mg (800 mg Oral Given 10/23/16 2339)     Initial Impression / Assessment and Plan / ED Course  I have reviewed the triage vital signs and the nursing notes.  Pertinent labs & imaging results that were available during my care of the patient were reviewed by me and considered in my medical decision making (see chart for details).     Patient with left shoulder pain as well as numbness into his fingers. Afebrile, vital signs are stable. No chest pain, and his troponin and EKG are unremarkable. No evidence of ST segment  abnormality or arrhythmia. Remainder of lab work is unremarkable. Imaging of the shoulder and chest show no acute cardiopulmonary or osseous abnormalities of the shoulder. Low suspicion of septic joint in the absence of erythema, warmth and with normal range of motion with no pain on range of motion. Low suspicion of pericarditis, myocarditis, pleural effusion, pneumonia, or PE. Low suspicion of gout, osteomyelitis, or fracture. Low suspicion of CVA in the presence of pain. Symptoms do not follow typical presentation of a cervical radiculopathy or carpal tunnel syndrome. Physical examination somewhat  consistent with rotator cuff tear, and patient has this on the right. He does do a lot of heavy lifting at work. On reevaluation, patient states he is feeling better after ibuprofen. No further emergent workup required at this time. Discussed indications for return to the ED. He will follow up with his primary care physician and orthopedist for reevaluation. Discussed indications for return to the ED. Pt verbalized understanding of and agreement with plan and is safe for discharge home at this time.   Final Clinical Impressions(s) / ED Diagnoses   Final diagnoses:  Acute pain of left shoulder  Numbness of fingers    New Prescriptions Discharge Medication List as of 10/24/2016 12:52 AM       Renita Papa, PA-C 10/24/16 0110    Molpus, John, MD 10/24/16 [phone]

## 2016-10-23 NOTE — ED Triage Notes (Addendum)
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 No chest pain. Hx of internal defibrillator x 10 years for hx of cardiomyopathy. Device has never fired.

## 2016-10-24 NOTE — Discharge Instructions (Signed)
Alternate 600 mg of ibuprofen and [phone] mg of Tylenol every 3 hours as needed for pain. Do not exceed 4000 mg of Tylenol daily. Apply ice or heat to the affected area for comfort. Do some gentle stretching to avoid muscle stiffness. Follow up with your primary care physician or orthopedic doctor for reevaluation of your shoulder pain. Return to the ED immediately if any concerning signs or symptoms develop such as chest pain, difficulty breathing, slurred speech, facial droop, or weakness.

## 2016-11-26 DIAGNOSIS — E1165 Type 2 diabetes mellitus with hyperglycemia: Secondary | ICD-10-CM | POA: Diagnosis not present

## 2016-12-10 ENCOUNTER — Other Ambulatory Visit: Payer: Self-pay | Admitting: Cardiovascular Disease

## 2016-12-10 NOTE — Telephone Encounter (Signed)
Rx has been sent to the pharmacy electronically. ° °

## 2016-12-24 ENCOUNTER — Ambulatory Visit (INDEPENDENT_AMBULATORY_CARE_PROVIDER_SITE_OTHER): Payer: 59 | Admitting: *Deleted

## 2016-12-24 DIAGNOSIS — I428 Other cardiomyopathies: Secondary | ICD-10-CM

## 2016-12-25 NOTE — Progress Notes (Signed)
Remote ICD transmission.   

## 2016-12-28 ENCOUNTER — Encounter: Payer: Self-pay | Admitting: Cardiology

## 2016-12-29 LAB — CUP PACEART REMOTE DEVICE CHECK
Date Time Interrogation Session: 20181210153600
HighPow Impedance: 62 Ohm
Implantable Lead Implant Date: 20060117
Implantable Lead Serial Number: 124785
Implantable Pulse Generator Implant Date: 20141107
Lead Channel Impedance Value: 541 Ohm
Lead Channel Pacing Threshold Amplitude: 1.3 V
Lead Channel Pacing Threshold Pulse Width: 1 ms
Lead Channel Setting Sensing Sensitivity: 0.6 mV
MDC IDC LEAD LOCATION: 753860
MDC IDC MSMT BATTERY REMAINING LONGEVITY: 102 mo
MDC IDC MSMT BATTERY REMAINING PERCENTAGE: 100 %
MDC IDC SET LEADCHNL RV PACING AMPLITUDE: 2.6 V
MDC IDC SET LEADCHNL RV PACING PULSEWIDTH: 0.8 ms
MDC IDC STAT BRADY RV PERCENT PACED: 0 %
Pulse Gen Serial Number: 126350

## 2016-12-30 DIAGNOSIS — Z23 Encounter for immunization: Secondary | ICD-10-CM | POA: Diagnosis not present

## 2017-01-10 ENCOUNTER — Encounter: Payer: Self-pay | Admitting: *Deleted

## 2017-01-29 ENCOUNTER — Ambulatory Visit: Payer: 59 | Admitting: Cardiovascular Disease

## 2017-01-29 ENCOUNTER — Encounter: Payer: Self-pay | Admitting: Cardiovascular Disease

## 2017-01-29 VITALS — BP 142/87 | HR 90 | Ht 68.0 in | Wt 195.6 lb

## 2017-01-29 DIAGNOSIS — I5022 Chronic systolic (congestive) heart failure: Secondary | ICD-10-CM | POA: Diagnosis not present

## 2017-01-29 DIAGNOSIS — E782 Mixed hyperlipidemia: Secondary | ICD-10-CM | POA: Diagnosis not present

## 2017-01-29 DIAGNOSIS — I428 Other cardiomyopathies: Secondary | ICD-10-CM

## 2017-01-29 DIAGNOSIS — G478 Other sleep disorders: Secondary | ICD-10-CM

## 2017-01-29 DIAGNOSIS — F341 Dysthymic disorder: Secondary | ICD-10-CM

## 2017-01-29 NOTE — Progress Notes (Signed)
Patient ID: Justin Ray, male   DOB: [birth year], 52 y.o.   MRN: [id number]     Primary MD: Dr. Tami Lin  PATIENT PROFILE: Justin Ray is a 52 y.o. male who was referred through the courtesy of Dr. Laney Pastor for cardiology care with me.  The patient had been previously followed in [place] for his cardiology care has also seen Dr. Lovena Le who follows him from an electrophysiologic standpoint with his history of cardiomyopathy and ICD implantation.  I last saw him in May 2018.  He presents for follow-up evaluation.    HPI:  Justin Ray has history of chronic systolic heart failure, class II and is felt to have a nonischemic cardiomyopathy.  He underwent insertion of an ICD in January 2006 and in November 2014 underwent ICD generator change by Dr.Taylor.  He has a history of anxiety/depression, type 2 diabetes mellitus on insulin, hyperlipidemia, and has continued have difficulty with elevated triglycerides.  He had been followed by the cardiology group in [place], but has developed recent discontent with his physician and is requested a new cardiology evaluation.  The patient has been on lisinopril 20 mg twice a day, digoxin 0.125 mg and carvedilol 25 mg twice a day for his coronary myopathy.  Hypertension.  He has been on Crestor 20 mg for hyperlipidemia.  An earlier this year he was started on placebo 2 capsules twice a day due to persistent elevated triglyceride levels.  His last lipid study 2 months ago showed a total cholesterol of 73, triglycerides 225, HDL 27, and EDL 45.  I suspect his LDL is inaccurate and this was recorded as 1.  He has a history of continued tobacco use, and started smoking at age 56 and currently is trying to quit but is still smoking.  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  He does not routinely exercise.  In 2004 he apparently underwent a sleep study and was told of having obstructive sleep apnea but was  never started on CPAP therapy.  Presently, he goes to bed about 1 AM and wakes up at 8 AM.  He often is up at least 2-4 times per night.  His sleep is nonrestorative.  He snores loudly.  He does note daytime sleepiness.  He is diabetic on insulin.  He has been taking Xanax as needed for anxiety.    Since I initially saw him in April 2017.  He underwent a sleep study at the [facility].  This was done on 06/29/2015.  This study was consistent with upper airway resistance syndrome with an AHI of 2.6 per hour.  His mean oxygen saturation was 88.47% with a minimum oxygen desaturation to 85%.  There was evidence for soft snoring.  Of note, he was never able to achieve any supine sleep.  He presents for reevaluation.  An Epworth Sleepiness Scale score was calculated today in the office and this endorsed at 9 with a moderate chance of dozing sitting and reading, watching TV, and while lying down to rest in the afternoon and circumstances persist.  He had a slight chance of dozing dozing while sitting inactive in a public place, as a passenger in a car, and sitting quietly after lunch.    Since I last saw him in May 2018 he has felt well from a cardiovascular standpoint and denies any chest pain or shortness of breath.  He saw Dr. Lovena Le in June 2018 and he was doing  well.  Systolic heart failure symptoms had improved and were now class I.  He has not had any recurrent ICD therapies but does have evidence for nonsustained ventricular tachycardia.  He has been undergoing physical therapy for shoulder and neck.  He underwent a follow-up echo Doppler study in October 2018, which now showed significant improvement in LV function with an EF of 50-55%.  Left atrium was mildly dilated.  He presents for evaluation    Past Medical History:  Diagnosis Date  . Anxiety   . Cardiomyopathy   . Carpal tunnel syndrome 10/14   right-GSC  . Cataract   . CHF (congestive heart failure) ([place])   . Diabetes  mellitus   . Dyslipidemia   . Hypertension   . ICD (implantable cardioverter-defibrillator) in place   . Sleep apnea    was tested many yr ago-does not use a cpap-  . Wears glasses     Past Surgical History:  Procedure Laterality Date  . CARDIAC DEFIBRILLATOR PLACEMENT  02/01/2004  . CARDIAC DEFIBRILLATOR PLACEMENT  11/14   replaced with new one  . CARPAL TUNNEL RELEASE Left 12/19/2012   Procedure: LEFT CARPAL TUNNEL RELEASE;  Surgeon: Roseanne Kaufman, MD;  Location: [facility];  Service: Orthopedics;  Laterality: Left;  . EYE SURGERY    . IMPLANTABLE CARDIOVERTER DEFIBRILLATOR (ICD) GENERATOR CHANGE N/A 11/21/2012   Procedure: ICD GENERATOR CHANGE;  Surgeon: Evans Lance, MD;  Location: [facility] CATH LAB;  Service: Cardiovascular;  Laterality: N/A;  . TONSILLECTOMY      Allergies  Allergen Reactions  . Codeine Other (See Comments)    Severe HA's.  . Niaspan [Niacin Er] Other (See Comments)    Per pt feels like skin is burning( REDNESS), and pins/needles are sticking   . Other     Other reaction(s): Other (See Comments) Per pt feels like skin is burning( REDNESS), and pins/needles are sticking   . Sulfonamide Derivatives Other (See Comments)    thrush    Current Outpatient Medications  Medication Sig Dispense Refill  . acetaminophen-codeine (TYLENOL #3) 300-30 MG tablet take 1 tablet by mouth every 6 hours if needed for pain  0  . ALPRAZolam (XANAX) 1 MG tablet Take 1 tablet (1 mg total) by mouth 2 (two) times daily as needed for anxiety (for anxiety). 60 tablet 5  . aspirin 81 MG tablet Take 81 mg by mouth daily.     . blood glucose meter kit and supplies KIT Test blood sugar 3 times daily. Dx code: E11.65 1 each 0  . carvedilol (COREG) 25 MG tablet take 1 tablet by mouth twice a day WITH A MEAL 180 tablet 1  . desvenlafaxine (PRISTIQ) 100 MG 24 hr tablet TAKE 1 TABLET BY MOUTH ONCE DAILY. 90 tablet 3  . digoxin (DIGOX) 0.125 MG tablet Take 1 tablet (125 mcg total)  by mouth daily. 90 tablet 3  . furosemide (LASIX) 40 MG tablet Take 1 tablet (40 mg total) by mouth as needed. 30 tablet 6  . gabapentin (NEURONTIN) 600 MG tablet Take 1 tablet by mouth 2 (two) times daily.  0  . insulin NPH-regular Human (HUMULIN 70/30) (70-30) 100 UNIT/ML injection inject 65 units subcutaneously every morning and inject 50-55 units subcutaneously every evening 40 vial 11  . lisinopril (PRINIVIL,ZESTRIL) 20 MG tablet Take 1 tablet (20 mg total) by mouth 2 (two) times daily. 180 tablet 3  . metFORMIN (GLUCOPHAGE-XR) 500 MG 24 hr tablet Take 1 tablet by mouth 2 (two)  times daily.  0  . methocarbamol (ROBAXIN) 500 MG tablet Take 1 tablet by mouth daily.  0  . rosuvastatin (CRESTOR) 20 MG tablet take 1 tablet by mouth once daily 30 tablet 11  . VASCEPA 1 g CAPS take 2 capsules by mouth twice a day 120 capsule 6  . VICTOZA 18 MG/3ML SOPN inject 0.6 milligram subcutaneously once daily  0   No current facility-administered medications for this visit.     Social History   Socioeconomic History  . Marital status: Widowed    Spouse name: Not on file  . Number of children: 0  . Years of education: Not on file  . Highest education level: Not on file  Social Needs  . Financial resource strain: Not on file  . Food insecurity - worry: Not on file  . Food insecurity - inability: Not on file  . Transportation needs - medical: Not on file  . Transportation needs - non-medical: Not on file  Occupational History  . Occupation: maintenance    Employer: UNEMPLOYED  Tobacco Use  . Smoking status: Current Every Day Smoker    Packs/day: 1.50    Types: Cigarettes  . Smokeless tobacco: Never Used  Substance and Sexual Activity  . Alcohol use: No  . Drug use: No  . Sexual activity: No    Comment: number of sex partners in the last 12 months  0  Other Topics Concern  . Not on file  Social History Narrative  . Not on file   Additional social history is notable that he is widowed for  9 years.  He lives by himself.  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  He completed 12th grade of education.  He continues to smoke cigarettes and has been smoking since age 19.  He does drink occasional beer.  He walks intermittently outside work.  Family History  Problem Relation Age of Onset  . Coronary artery disease Father        CABG  . Cancer Mother        colon  . Heart disease Brother   . Cancer Unknown    Additional family history is that his mother died at 26 secondary to cancer.  His father is deceased and had undergone CABG revascularization surgery.  His 2 brothers, ages 34 and 3.  ROS General: Negative; No fevers, chills, or night sweats HEENT: Negative; No changes in vision or hearing, sinus congestion, difficulty swallowing Pulmonary: Negative; No cough, wheezing, shortness of breath, hemoptysis Cardiovascular:  See HPI; No chest pain, presyncope, syncope, palpitations, edema GI: Negative; No nausea, vomiting, diarrhea, or abdominal pain GU: Negative; No dysuria, hematuria, or difficulty voiding Musculoskeletal: Negative; no myalgias, joint pain, or weakness Hematologic/Oncologic: Negative; no easy bruising, bleeding Endocrine: Negative; no heat/cold intolerance; no diabetes Neuro: Negative; no changes in balance, headaches Skin: Negative; No rashes or skin lesions Psychiatric: Positive for anxiety and depression Sleep: Positive for poor sleep, snoring, nonrestorative sleep, and daytime sleepiness.; No  bruxism, restless legs, hypnogagnic hallucinations Other comprehensive 14 point system review is negative   Physical Exam BP (!) 142/87   Pulse 90   Ht '5\' 8"'$  (1.727 m)   Wt 195 lb 9.6 oz (88.7 kg)   BMI 29.74 kg/m    Repeat blood pressure by me 136/70  Wt Readings from Last 3 Encounters:  01/29/17 195 lb 9.6 oz (88.7 kg)  10/23/16 189 lb (85.7 kg)  06/25/16 192 lb (87.1 kg)   General:  Alert, oriented, no distress.  Skin:  normal turgor, no rashes, warm and dry HEENT: Normocephalic, atraumatic. Pupils equal round and reactive to light; sclera anicteric; extraocular muscles intact; Nose without nasal septal hypertrophy Mouth/Parynx benign; Mallinpatti scale 3 Neck: No JVD, no carotid bruits; normal carotid upstroke Lungs: clear to ausculatation and percussion; no wheezing or rales Chest wall: without tenderness to palpitation Heart: PMI not displaced, RRR, s1 s2 normal, 1/6 systolic murmur, no diastolic murmur, no rubs, gallops, thrills, or heaves Abdomen: soft, nontender; no hepatosplenomehaly, BS+; abdominal aorta nontender and not dilated by palpation. Back: no CVA tenderness Pulses 2+ Musculoskeletal: full range of motion, normal strength, no joint deformities Extremities: no clubbing cyanosis or edema, Homan's sign negative  Neurologic: grossly nonfocal; Cranial nerves grossly wnl Psychologic: Normal mood and affect   ECG (independently read by me): Normal sinus rhythm at 90 bpm.  Borderline LVH.  No ectopy.  Normal intervals.  May 2018 ECG (independently read by me):normal sinus rhythm at 69 bpm.  Normal intervals.  No ectopy.  April 2017 ECG (independently read by me): Normal sinus rhythm at 73 bpm.  Borderline voltage criteria for LVH.  LABS:  BMP Latest Ref Rng & Units 10/23/2016 05/24/2015 02/11/2015  Glucose 65 - 99 mg/dL 107(H) 93 163(H)  BUN 6 - 20 mg/dL '20 16 15  '$ Creatinine 0.61 - 1.24 mg/dL 0.82 0.78 0.84  Sodium 135 - 145 mmol/L 137 140 140  Potassium 3.5 - 5.1 mmol/L 3.8 4.5 4.3  Chloride 101 - 111 mmol/L 107 107 105  CO2 22 - 32 mmol/L '22 25 25  '$ Calcium 8.9 - 10.3 mg/dL 8.6(L) 8.7 8.7     Hepatic Function Latest Ref Rng & Units 10/23/2016 05/24/2015 02/11/2015  Total Protein 6.5 - 8.1 g/dL 7.3 6.9 6.6  Albumin 3.5 - 5.0 g/dL 3.9 4.3 4.1  AST 15 - 41 U/L '30 31 31  '$ ALT 17 - 63 U/L 37 42 51(H)  Alk Phosphatase 38 - 126 U/L 73 59 68  Total Bilirubin 0.3 - 1.2 mg/dL 0.5 0.7 0.6    CBC  Latest Ref Rng & Units 10/23/2016 03/30/2016 05/24/2015  WBC 4.0 - 10.5 K/uL 8.5 8.2 8.1  Hemoglobin 13.0 - 17.0 g/dL 16.6 17.2 17.9(H)  Hematocrit 39.0 - 52.0 % 47.9 47.7 52.0(H)  Platelets 150 - 400 K/uL 177 - 160   Lab Results  Component Value Date   MCV 89.4 10/23/2016   MCV 88.3 03/30/2016   MCV 89.5 05/24/2015   Lab Results  Component Value Date   TSH 0.800 09/05/2011   Lab Results  Component Value Date   HGBA1C 7.5 05/04/2015     BNP    Component Value Date/Time   BNP 11.9 05/24/2015 1036    ProBNP No results found for: PROBNP   Lipid Panel     Component Value Date/Time   CHOL 83 (L) 05/06/2015 1723   TRIG 181 (H) 05/06/2015 1723   HDL 32 (L) 05/06/2015 1723   CHOLHDL 2.6 05/06/2015 1723   CHOLHDL 2.7 02/11/2015 0958   VLDL 45 (H) 02/11/2015 0958   LDLCALC 15 05/06/2015 1723    RADIOLOGY: No results found.  IMPRESSION:  No diagnosis found. ASSESSMENT AND PLAN: [name] is a 52 year old gentleman who has a long-standing history of tobacco use and continues to smoke cigarettes.  He underwent initial defibrillator insertion in 1700 for systolic heart failure and in 2014 underwent an ICD upgrade generator change.  An echo Doppler study was done by [facility]  in 2015 showed an ejection fraction at a  ~ 45% with LVH and grade 1 diastolic dysfunction.  His most recent echo Doppler study from October 2018 now shows an EF at 50-55%.  He has not had any defibrillator discharges.  His blood pressure today is upper normal and he is taking carvedilol 25 mg twice a day, digoxin 0.125 mg, furosemide as needed, lisinopril 20 mg twice a day.  He has a history of mixed hyperlipidemia and is on rosuvastatin 20 mg and Vascepa 2 capsules twice a day. Although remotely, he may been told of having sleep apnea, on his sleep study last year, he did not meet criteria for CPAP therapy and his overall AHI was 2.6, with absence of supine sleep.  He did have reduced oxygen  saturation with a nadir of 85%.  Is certainly likely that if he had supine sleep.  His AHI would have been elevated and demonstrated obstructive sleep apnea.  When I last saw him, I discussed the possibility of switching him from ACE inhibition to [facility] therapy, but with his class I symptoms and EF of 50-55% on his most recent echo this is not indicated.  He will be having laboratory by his primary physician, Dr. Karl Luke in [place].  I will ask that the these before any to me for my review.  He will be seen Dr. Lovena Le in 5 months.  As long as he is stable I will see him in one year for cardiology reevaluation Time spent: 25 minutes   Troy Sine, MD, [facility] 01/29/2017 3:22 PM

## 2017-01-29 NOTE — Patient Instructions (Signed)
Medication Instructions:  Your physician recommends that you continue on your current medications as directed. Please refer to the Current Medication list given to you today.   Labwork: none  Testing/Procedures: none  Follow-Up: Your physician wants you to follow-up in: 1 year with Dr. Claiborne Billings. You will receive a reminder letter in the mail two months in advance. If you don't receive a letter, please call our office to schedule the follow-up appointment.   Any Other Special Instructions Will Be Listed Below (If Applicable).     If you need a refill on your cardiac medications before your next appointment, please call your pharmacy.

## 2017-01-31 ENCOUNTER — Encounter: Payer: Self-pay | Admitting: Cardiovascular Disease

## 2017-02-11 ENCOUNTER — Telehealth: Payer: Self-pay | Admitting: *Deleted

## 2017-02-11 NOTE — Telephone Encounter (Signed)
   [facility] Pre-operative Risk Assessment    Request for surgical clearance:  1. What type of surgery is being performed? C6-C7  ACDF PROCEDURE WITH INSTRUMENTATION AND ALLOGRAFT   2. When is this surgery scheduled? TBD   3. What type of clearance is required (medical clearance vs. Pharmacy clearance to hold med vs. Both)? MEDICAL   4. Are there any medications that need to be held prior to surgery and how long?    5. Practice name and name of physician performing surgery? DR Barnhill    6. What is your office phone and fax number? PHONE [phone]  FAX [phone] [place]     7. Anesthesia type (None, local, MAC, general) ? UNKNOWN

## 2017-02-12 ENCOUNTER — Telehealth: Payer: Self-pay | Admitting: Internal Medicine

## 2017-02-12 NOTE — Telephone Encounter (Signed)
   Primary Cardiologist: [name], MD  Chart reviewed as part of pre-operative protocol coverage. Dr.Kelly you have seen this patient 2 weeks ago. He was doing well on cardiac stand point. Can you provide clearance and comment regarding holding ASA?  Please reply back to " CV DIV preop".   [place], [place] 02/12/2017, 12:13 PM

## 2017-02-12 NOTE — Telephone Encounter (Signed)
Clearance form received in mail. Placed in Triage box to be addressed.

## 2017-02-13 NOTE — Telephone Encounter (Signed)
OK for surgery; hold ASA for 7 days prior to neurosurgery.

## 2017-02-13 NOTE — Telephone Encounter (Signed)
Per Richardson Dopp, PA I called to confirm if clearance letter received today. I did leave a message requesting a call back to confirm letter.

## 2017-02-13 NOTE — Telephone Encounter (Signed)
  Pre-operative Risk Assessment - Provider Statement    Patient ID:  Justin Ray, DOB: 1965-12-10, MRN: [id number]   Justin Ray chart has been reviewed for pre-operative risk assessment.  The patient was last seen by Dr. Claiborne Billings 01/29/17.  He was consulted regarding the patient's eligibility for surgery and whether or not aspirin could be held for 7 days.  Based upon the patient's past medical history and time since last visit, and according to ACC/AHA guidelines, Justin Ray would be at acceptable risk for the planned procedure without further cardiovascular testing.  Letter has been faxed to the requesting surgeon.  Please contact the surgeon's office to ensure it has been received.   Signed,  Richardson Dopp, PA-C  02/13/2017 2:26 PM

## 2017-02-26 DIAGNOSIS — E11319 Type 2 diabetes mellitus with unspecified diabetic retinopathy without macular edema: Secondary | ICD-10-CM | POA: Diagnosis not present

## 2017-02-26 DIAGNOSIS — E119 Type 2 diabetes mellitus without complications: Secondary | ICD-10-CM | POA: Diagnosis not present

## 2017-03-01 ENCOUNTER — Other Ambulatory Visit: Payer: Self-pay | Admitting: Orthopedic Surgery

## 2017-03-01 ENCOUNTER — Encounter (HOSPITAL_COMMUNITY): Payer: Self-pay | Admitting: Vascular Surgery

## 2017-03-01 NOTE — Progress Notes (Addendum)
Anesthesia Chart Review: Patient is a 52 year old male scheduled for C6-7 ACDF on 03/06/17 by Dr. Phylliss Bob (8:30 AM). Case was just added 03/01/17 PM, so he does not currently have a PAT visit scheduled.   History includes smoking, CHF, cardiomyopathy (felt to be "nonischemic"), AICD [employer]; 01/2004, last generator change 11/2012), DM2, HTN, dyslipidemia, OSA (never started CPAP), anxiety, tonsillectomy, eye surgery.   - PCP is Dr. Tami Lin (by cardiology records). - Cardiologist is Dr. [name] (previously was followed by cardiologist Dr. Jyl Heinz in [place]). Last visit 01/29/17. On 02/11/17, he wrote, "OK for surgery; hold ASA for 7 days prior to neurosurgery."  - EP Cardiologist is Dr. Cristopher Peru. Last visit 06/25/16.   Meds include ASA 81 mg, Coreg, Pristiq, digoxin, Lasix, Humulin 70/30, lisinopril, metformin, Prilosec, Crestor, Vascepa, Victoza.  EKG 01/29/17: NSR, minimal voltage criteria for LVH, may be normal variant.  Echo 10/1/8: Study Conclusions - Left ventricle: The cavity size was normal. Wall thickness was   increased in a pattern of mild LVH. Systolic function was normal.   The estimated ejection fraction was in the range of 50% to 55%. - Left atrium: The atrium was mildly dilated.  Nuclear stress test 01/18/15 [facility]): Impression: 1.  No ischemia seen on the scan. 2.  Normal gated images. 3.  Ejection fraction calculated by the computer 44% visually look normal.  CXR 10/24/16: IMPRESSION: No acute abnormalities.  Labs will need to be updated prior to surgery.  On 03/01/17, [place] with [employer] notified of patient's surgery date and time. Anticipated need to temporarily deactivate ICD given close proximity to surgical site. Requested he arrive between 7-7:30 AM. He plans to contact [facility] prior to arrival.   Due to late posting, we do not currently have PAT appointments available. Can have our scheduler see if he is  able to come by for labs otherwise he will be a same day work-up which is not ideal for a first case. (Update: 03/05/17 labs showed Cr 0.86, glucose 149, A1c 6.7, H/H 17.5/50.7, PLT 151. PT/PTT WNL. T&S done. If no acute changes then I anticipate that he can proceed as planned. Of note, cardiac device form indicates procedure may interfere with device function, so magnet should be placed over device during procedure. However, confirmed with anesthesiologist Dr. Therisa Doyne that anesthesia preference would be for industry to reprogram pre-/post- operatively given ICD close proximity to surgical site.)   George Hugh [facility] Short Stay Center/Anesthesiology Phone [phone] 03/04/2017 4:56 PM

## 2017-03-01 NOTE — Progress Notes (Signed)
Notified [employer] notified of patient's surgery on 03/06/17.

## 2017-03-05 ENCOUNTER — Encounter (HOSPITAL_COMMUNITY): Payer: Self-pay | Admitting: *Deleted

## 2017-03-05 ENCOUNTER — Encounter (HOSPITAL_COMMUNITY)
Admission: RE | Admit: 2017-03-05 | Discharge: 2017-03-05 | Disposition: A | Discharge status: Home / Self Care | Payer: Worker's Compensation | Source: Ambulatory Visit | Attending: Orthopedic Surgery | Admitting: Orthopedic Surgery

## 2017-03-05 ENCOUNTER — Other Ambulatory Visit: Payer: Self-pay

## 2017-03-05 DIAGNOSIS — Z01818 Encounter for other preprocedural examination: Secondary | ICD-10-CM | POA: Insufficient documentation

## 2017-03-05 DIAGNOSIS — M79602 Pain in left arm: Secondary | ICD-10-CM | POA: Insufficient documentation

## 2017-03-05 LAB — ABO/RH: ABO/RH(D): A NEG

## 2017-03-05 LAB — CBC WITH DIFFERENTIAL/PLATELET
BASOS ABS: 0 10*3/uL (ref 0.0–0.1)
Basophils Relative: 0 %
EOS ABS: 0.1 10*3/uL (ref 0.0–0.7)
EOS PCT: 2 %
HCT: 50.7 % (ref 39.0–52.0)
HEMOGLOBIN: 17.5 g/dL — AB (ref 13.0–17.0)
LYMPHS PCT: 23 %
Lymphs Abs: 2 10*3/uL (ref 0.7–4.0)
MCH: 31.4 pg (ref 26.0–34.0)
MCHC: 34.5 g/dL (ref 30.0–36.0)
MCV: 91 fL (ref 78.0–100.0)
Monocytes Absolute: 0.7 10*3/uL (ref 0.1–1.0)
Monocytes Relative: 9 %
NEUTROS ABS: 5.6 10*3/uL (ref 1.7–7.7)
Neutrophils Relative %: 66 %
PLATELETS: 151 10*3/uL (ref 150–400)
RBC: 5.57 MIL/uL (ref 4.22–5.81)
RDW: 13.2 % (ref 11.5–15.5)
WBC: 8.5 10*3/uL (ref 4.0–10.5)

## 2017-03-05 LAB — HEMOGLOBIN A1C
HEMOGLOBIN A1C: 6.7 % — AB (ref 4.8–5.6)
MEAN PLASMA GLUCOSE: 145.59 mg/dL

## 2017-03-05 LAB — COMPREHENSIVE METABOLIC PANEL
ALBUMIN: 4 g/dL (ref 3.5–5.0)
ALK PHOS: 63 U/L (ref 38–126)
ALT: 43 U/L (ref 17–63)
AST: 32 U/L (ref 15–41)
Anion gap: 11 (ref 5–15)
BUN: 14 mg/dL (ref 6–20)
CALCIUM: 9.1 mg/dL (ref 8.9–10.3)
CHLORIDE: 107 mmol/L (ref 101–111)
CO2: 22 mmol/L (ref 22–32)
Creatinine, Ser: 0.86 mg/dL (ref 0.61–1.24)
GFR calc Af Amer: >60 mL/min (ref >60)
GFR calc non Af Amer: >60 mL/min (ref >60)
GLUCOSE: 149 mg/dL — AB (ref 65–99)
Potassium: 4.6 mmol/L (ref 3.5–5.1)
SODIUM: 140 mmol/L (ref 135–145)
Total Bilirubin: 0.6 mg/dL (ref 0.3–1.2)
Total Protein: 6.9 g/dL (ref 6.5–8.1)

## 2017-03-05 LAB — APTT: aPTT: 30 seconds (ref 24–36)

## 2017-03-05 LAB — TYPE AND SCREEN
ABO/RH(D): A NEG
Antibody Screen: NEGATIVE

## 2017-03-05 LAB — PROTIME-INR
INR: 0.99
Prothrombin Time: 13 seconds (ref 11.4–15.2)

## 2017-03-05 NOTE — Anesthesia Preprocedure Evaluation (Addendum)
Anesthesia Evaluation  Patient identified by MRN, date of birth, ID band Patient awake    Reviewed: Allergy & Precautions, NPO status , Patient's Chart, lab work & pertinent test results  Airway Mallampati: III  TM Distance: <3 FB Neck ROM: Limited  Mouth opening: Limited Mouth Opening  Dental  (+) Edentulous Upper, Missing,    Pulmonary sleep apnea , COPD, Current Smoker,    Pulmonary exam normal        Cardiovascular hypertension, Pt. on medications and Pt. on home beta blockers +CHF  Normal cardiovascular exam+ Cardiac Defibrillator  Rhythm:Regular Rate:Normal     Neuro/Psych Anxiety Depression    GI/Hepatic GERD  ,  Endo/Other  diabetes, Poorly Controlled, Type 2, Insulin Dependent, Oral Hypoglycemic Agents  Renal/GU      Musculoskeletal negative musculoskeletal ROS (+)   Abdominal Normal abdominal exam  (+)   Peds  Hematology negative hematology ROS (+)   Anesthesia Other Findings Leece  ECHO COMPLETE WO IMAGING ENHANCING AGENT  Order# [id number]  Reading physician: Fay Records, MD Ordering physician: Troy Sine, MD Study date: 10/15/16 Result Notes for ECHOCARDIOGRAM COMPLETE   Notes recorded by Fidel Levy, RN on 10/31/2016 at 9:22 AM EDT Patient called w/results ------  Notes recorded by Troy Sine, MD on 10/28/2016 at 9:02 PM EDT Nl EF; mild LVH; mild LA dilation   Study Result   Result status: Final result                          [name]                        [address], [place] [address]                            [phone]  ------------------------------------------------------------------- Transthoracic Echocardiography  Patient:    Justin Ray, Justin Ray MR #:       [id number] Study Date: 10/15/2016 Gender:     M Age:        52 Height:     172.7 cm Weight:     87.1 kg BSA:        2.07 m^2 Pt. Status: Room:    ATTENDING    [name], M.D.  ORDERING     [name], M.D.  REFERRING    [name], M.D.  SONOGRAPHER  [name], Will  PERFORMING   [name], Outpatient  cc:  ------------------------------------------------------------------- LV EF: 50% -   55%  ------------------------------------------------------------------- Indications:      (I42.8).  ------------------------------------------------------------------- History:   PMH:  Non ischemic cardiomyopathy. ICD. Acquired from the patient and from the patient&'s chart.  Risk factors: Hypertension. Diabetes mellitus. Dyslipidemia.  ------------------------------------------------------------------- Study Conclusions  - Left ventricle: The cavity size was normal. Wall thickness was   increased in a pattern of mild LVH. Systolic function was normal.   The estimated ejection fraction was in the range of 50% to 55%. - Left atrium: The atrium was mildly dilated.  ------------------------------------------------------------------- Study data:  Comparison was made to the study of 08/26/2013.  Study status:  Routine.  Procedure:  The patient reported no pain pre or post test. Transthoracic echocardiography for left ventricular function evaluation. Image quality was adequate.  Study completion:  There were no complications.          Transthoracic echocardiography.  M-mode, complete 2D, spectral Doppler, and color Doppler.  Birthdate:  Patient birthdate: 03-Nov-1965.  Age:  Patient is 52 yr old.  Sex:  Gender: male.    BMI: 29.2 kg/m^2.  Blood pressure:     118/70  Patient status:  Outpatient.  Study date: Study date: 10/15/2016. Study time: 09:50 AM.  Location:  [facility]  -------------------------------------------------------------------  ------------------------------------------------------------------- Left ventricle:  The cavity size was normal. Wall thickness was increased in a pattern of mild LVH. Systolic  function was normal. The estimated ejection fraction was in the range of 50% to 55%.  ------------------------------------------------------------------- Aortic valve:   Structurally normal valve.   Cusp separation was normal.  Doppler:  Transvalvular velocity was within the normal range. There was no stenosis. There was no significant regurgitation.  ------------------------------------------------------------------- Mitral valve:   Mildly thickened leaflets .  Doppler:  There was trivial regurgitation.  ------------------------------------------------------------------- Left atrium:  The atrium was mildly dilated.  ------------------------------------------------------------------- Right ventricle:  The cavity size was normal. Systolic function was normal.  ------------------------------------------------------------------- Pulmonic valve:    Structurally normal valve.   Cusp separation was normal.  Doppler:  Transvalvular velocity was within the normal range. There was no regurgitation.  ------------------------------------------------------------------- Tricuspid valve:   Structurally normal valve.   Leaflet separation was normal.  Doppler:  Transvalvular velocity was within the normal range. There was mild regurgitation.  ------------------------------------------------------------------- Right atrium:  The atrium was normal in size.  ------------------------------------------------------------------- Pericardium:  There was no pericardial effusion.  ------------------------------------------------------------------- Systemic veins: Inferior vena cava: The vessel was normal in size. The respirophasic diameter changes were in the normal range (= 50%), consistent with normal central venous pressure.  ------------------------------------------------------------------- Measurements   Left ventricle                           Value        Reference  LV ID, ED,  PLAX chordal                  50    mm     43 - 52  LV ID, ES, PLAX chordal                  37.2  mm     23 - 38  LV fx shortening, PLAX chordal   (L)     26    %      >=29  LV PW thickness, ED                      12.6  mm     ---------  IVS/LV PW ratio, ED                      1.02         <=1.3  Stroke volume, 2D                        72    ml     ---------  Stroke volume/bsa, 2D                    35    ml/m^2 ---------  LV ejection fraction, 1-p A4C            50    %      ---------  LV end-diastolic volume, 2-p             117   ml     ---------  LV end-systolic volume, 2-p              58    ml     ---------  LV ejection fraction, 2-p                50    %      ---------  Stroke volume, 2-p                       59    ml     ---------  LV end-diastolic volume/bsa, 2-p         57    ml/m^2 ---------  LV end-systolic volume/bsa, 2-p          28    ml/m^2 ---------  Stroke volume/bsa, 2-p                   28.6  ml/m^2 ---------  LV e&', lateral                           6.73  cm/s   ---------  LV E/e&', lateral                         9.73         ---------  LV e&', medial                            6.82  cm/s   ---------  LV E/e&', medial                          9.6          ---------  LV e&', average                           6.78  cm/s   ---------  LV E/e&', average                         9.67         ---------    Ventricular septum                       Value        Reference  IVS thickness, ED                        12.8  mm     ---------    LVOT                                     Value        Reference  LVOT ID, S                               24    mm     ---------  LVOT area  4.52  cm^2   ---------  LVOT ID                                  24    mm     ---------  LVOT peak velocity, S                    81.5  cm/s   ---------  LVOT mean velocity, S                    52.8  cm/s   ---------  LVOT VTI, S                              16     cm     ---------  LVOT peak gradient, S                    3     mm Hg  ---------  Stroke volume (SV), LVOT DP              72.4  ml     ---------  Stroke index (SV/bsa), LVOT DP           35    ml/m^2 ---------    Aorta                                    Value        Reference  Aortic root ID, ED                       35    mm     ---------  Ascending aorta ID, A-P, S               34    mm     ---------    Left atrium                              Value        Reference  LA ID, A-P, ES                           42    mm     ---------  LA ID/bsa, A-P                           2.03  cm/m^2 <=2.2  LA volume, S                             71    ml     ---------  LA volume/bsa, S                         34.4  ml/m^2 ---------  LA volume, ES, 1-p A4C                   60    ml     ---------  LA volume/bsa, ES, 1-p A4C               29  ml/m^2 ---------  LA volume, ES, 1-p A2C                   73    ml     ---------  LA volume/bsa, ES, 1-p A2C               35.3  ml/m^2 ---------    Mitral valve                             Value        Reference  Mitral E-wave peak velocity              65.5  cm/s   ---------  Mitral A-wave peak velocity              56.6  cm/s   ---------  Mitral deceleration time                 222   ms     150 - 230  Mitral E/A ratio, peak                   1.2          ---------    Pulmonary arteries                       Value        Reference  PA pressure, S, DP                       26    mm Hg  <=30    Tricuspid valve                          Value        Reference  Tricuspid regurg peak velocity           239   cm/s   ---------  Tricuspid peak RV-RA gradient            23    mm Hg  ---------    Systemic veins                           Value        Reference  Estimated CVP                            3     mm Hg  ---------    Right ventricle                          Value        Reference  RV pressure, S, DP                       26    mm Hg  <=30  RV s&',  lateral, S                        11.8  cm/s   ---------  Legend: (L)  and  (H)  mark values outside specified reference range.  ------------------------------------------------------------------- Prepared and Electronically Authenticated by  Dorris Carnes, M.D. 2018-10-01T19:13:32 MERGE Images   Show images for ECHOCARDIOGRAM COMPLETE Patient Information   Patient Name  Robert, Sperl Sex Male DOB [birth year] SSN [id number] Reason For Exam  Priority: Routine  Dx: NICM (nonischemic cardiomyopathy) ([place]) (I42.8 (ICD-10-CM)) Surgical History   Surgical History    No past medical history on file.  Other Surgical History    Procedure Laterality Date Comment Source CARDIAC DEFIBRILLATOR PLACEMENT  02/01/2004  Provider CARDIAC DEFIBRILLATOR PLACEMENT  11/14 replaced with new one Provider CARPAL TUNNEL RELEASE Left 12/19/2012 Procedure: LEFT CARPAL TUNNEL RELEASE; Surgeon: Roseanne Kaufman, MD; Location: [facility]; Service: Orthopedics; Laterality: Left; Provider CARPAL TUNNEL RELEASE Right   Provider EYE SURGERY Right  cataract surgery with lens implants Provider IMPLANTABLE CARDIOVERTER DEFIBRILLATOR (ICD) GENERATOR CHANGE N/A 11/21/2012 Procedure: ICD GENERATOR CHANGE; Surgeon: Evans Lance, MD; Location: [facility] CATH LAB; Service: Cardiovascular; Laterality: N/A; Provider TONSILLECTOMY    Provider  Performing Technologist/Nurse   Performing Technologist/Nurse: Meliton Rattan  CUP PACEART REMOTE DEVICE CHECK  Order# [id number]  Ordering physician: Evans Lance, MD Study date: 12/29/2016 Result Notes for CUP PACEART REMOTE DEVICE CHECK   Notes recorded by Evans Lance, MD on 01/01/2017 at 9:12 PM EST Remote device check reviewed. Histograms appropriate. Leads and battery stable for patient. Follow up as outlined above. No recommended changes.   Conclusion   Normal remote reviewed. 2 VHR episodes - EGMs suggest  SVT Next follow up remote 3/11Kristen Harmon Pier, RN, BSN Result Report      Implants     Implant Bost [id number] Darreld Mclean [id number] - Implanted     Model/Cat number: W299 [employer] VR Serial number: [id number] Manufacturer: Wilkie Aye   As of 12/24/2016     Status: Implanted    Guic 0185 [id number] - Implanted     Model/Cat number: 0175 Serial number: [id number] Manufacturer: [place]   As of 11/12/2014     Status: Implanted    Guic 0185 [employer] G [id number] - Implanted     Model/Cat number: 2353 ENDOTAK RELIANCE G Serial number: [id number] Manufacturer: [employer]   As of 02/21/2016     Status: Implanted    Order-Level Documents:   There are no order-level documents.  Encounter-Level Documents - 10/15/2016:   Scan on 10/25/2016 2:56 PM by Default, Provider, MDScan on 10/25/2016 2:56 PM by Default, Provider, MD  Electronic signature on 10/15/2016 9:18 AM  Electronic signature on 10/15/2016 9:17 AM    Signed   Electronically signed by Fay Records, MD on 10/15/16 at 1913 EDT Printable Result Report    Result Report  External Result Report    External Result Report     Reproductive/Obstetrics                         Anesthesia Physical Anesthesia Plan  ASA: III  Anesthesia Plan: General   Post-op Pain Management:    Induction: Intravenous  PONV Risk Score and Plan: 2 and Ondansetron, Midazolam and Treatment may vary due to age or medical condition  Airway Management Planned: Oral ETT and Video Laryngoscope Planned  Additional Equipment:   Intra-op Plan:   Post-operative Plan: Extubation in OR  Informed Consent: I have reviewed the patients History and Physical, chart, labs and discussed the procedure including the risks, benefits and alternatives for the proposed anesthesia with the patient or authorized representative who has indicated his/her understanding and acceptance.   Dental advisory  given  Plan Discussed with: CRNA, Anesthesiologist and Surgeon  Anesthesia Plan Comments:  Anesthesia Quick Evaluation  

## 2017-03-05 NOTE — Progress Notes (Signed)
Spoke with pt for pre-op call. Pt has hx of non-ischemic cardiomyopathy, chronic systolic heart failure and has an ICD. Pt sees Dr. Cristopher Peru and Dr. [name] for his cardiac history. Pt denies any recent chest pain or sob. Pt is a type 2 diabetic. Today's A1C was 6.7. Pt states his fasting blood is usually between 80-140. Pt instructed to take 70% of his Humulin 70/30 this evening (will take 38 units) and 70% in the AM (will take 45 units in the AM). No Victoza in the AM. Pt instructed to check his blood sugar in the AM when he gets up. If blood sugar is 70 or below, treat with 1/2 cup of clear juice (apple or cranberry) and recheck blood sugar 15 minutes after drinking juice. Pt voiced understanding.

## 2017-03-06 ENCOUNTER — Encounter (HOSPITAL_COMMUNITY): Payer: Self-pay | Admitting: *Deleted

## 2017-03-06 ENCOUNTER — Ambulatory Visit (HOSPITAL_COMMUNITY): Payer: No Typology Code available for payment source | Admitting: Vascular Surgery

## 2017-03-06 ENCOUNTER — Encounter (HOSPITAL_COMMUNITY)
Admission: RE | Disposition: A | Discharge status: Home / Self Care | Payer: Self-pay | Source: Ambulatory Visit | Attending: Orthopedic Surgery

## 2017-03-06 ENCOUNTER — Observation Stay (HOSPITAL_COMMUNITY)
Admission: RE | Admit: 2017-03-06 | Discharge: 2017-03-07 | Disposition: A | Discharge status: Home / Self Care | Payer: No Typology Code available for payment source | Source: Ambulatory Visit | Attending: Orthopedic Surgery | Admitting: Orthopedic Surgery

## 2017-03-06 ENCOUNTER — Ambulatory Visit (HOSPITAL_COMMUNITY): Payer: No Typology Code available for payment source

## 2017-03-06 DIAGNOSIS — Z882 Allergy status to sulfonamides status: Secondary | ICD-10-CM | POA: Insufficient documentation

## 2017-03-06 DIAGNOSIS — Z95810 Presence of automatic (implantable) cardiac defibrillator: Secondary | ICD-10-CM | POA: Diagnosis not present

## 2017-03-06 DIAGNOSIS — J449 Chronic obstructive pulmonary disease, unspecified: Secondary | ICD-10-CM | POA: Insufficient documentation

## 2017-03-06 DIAGNOSIS — M542 Cervicalgia: Secondary | ICD-10-CM | POA: Diagnosis not present

## 2017-03-06 DIAGNOSIS — I071 Rheumatic tricuspid insufficiency: Secondary | ICD-10-CM | POA: Diagnosis not present

## 2017-03-06 DIAGNOSIS — Z885 Allergy status to narcotic agent status: Secondary | ICD-10-CM | POA: Insufficient documentation

## 2017-03-06 DIAGNOSIS — Z7982 Long term (current) use of aspirin: Secondary | ICD-10-CM | POA: Diagnosis not present

## 2017-03-06 DIAGNOSIS — M4802 Spinal stenosis, cervical region: Principal | ICD-10-CM | POA: Insufficient documentation

## 2017-03-06 DIAGNOSIS — F17210 Nicotine dependence, cigarettes, uncomplicated: Secondary | ICD-10-CM | POA: Insufficient documentation

## 2017-03-06 DIAGNOSIS — E119 Type 2 diabetes mellitus without complications: Secondary | ICD-10-CM | POA: Insufficient documentation

## 2017-03-06 DIAGNOSIS — Z79899 Other long term (current) drug therapy: Secondary | ICD-10-CM | POA: Insufficient documentation

## 2017-03-06 DIAGNOSIS — M79602 Pain in left arm: Secondary | ICD-10-CM | POA: Diagnosis not present

## 2017-03-06 DIAGNOSIS — Z7984 Long term (current) use of oral hypoglycemic drugs: Secondary | ICD-10-CM | POA: Insufficient documentation

## 2017-03-06 DIAGNOSIS — Z794 Long term (current) use of insulin: Secondary | ICD-10-CM | POA: Diagnosis not present

## 2017-03-06 DIAGNOSIS — Z888 Allergy status to other drugs, medicaments and biological substances status: Secondary | ICD-10-CM | POA: Diagnosis not present

## 2017-03-06 DIAGNOSIS — I110 Hypertensive heart disease with heart failure: Secondary | ICD-10-CM | POA: Insufficient documentation

## 2017-03-06 DIAGNOSIS — Z419 Encounter for procedure for purposes other than remedying health state, unspecified: Secondary | ICD-10-CM

## 2017-03-06 DIAGNOSIS — M4322 Fusion of spine, cervical region: Secondary | ICD-10-CM | POA: Diagnosis not present

## 2017-03-06 DIAGNOSIS — M5412 Radiculopathy, cervical region: Secondary | ICD-10-CM | POA: Insufficient documentation

## 2017-03-06 DIAGNOSIS — G4730 Sleep apnea, unspecified: Secondary | ICD-10-CM | POA: Insufficient documentation

## 2017-03-06 DIAGNOSIS — I509 Heart failure, unspecified: Secondary | ICD-10-CM | POA: Insufficient documentation

## 2017-03-06 DIAGNOSIS — M5410 Radiculopathy, site unspecified: Secondary | ICD-10-CM | POA: Diagnosis present

## 2017-03-06 DIAGNOSIS — F329 Major depressive disorder, single episode, unspecified: Secondary | ICD-10-CM | POA: Diagnosis not present

## 2017-03-06 DIAGNOSIS — E785 Hyperlipidemia, unspecified: Secondary | ICD-10-CM | POA: Diagnosis not present

## 2017-03-06 DIAGNOSIS — I429 Cardiomyopathy, unspecified: Secondary | ICD-10-CM | POA: Insufficient documentation

## 2017-03-06 DIAGNOSIS — K219 Gastro-esophageal reflux disease without esophagitis: Secondary | ICD-10-CM | POA: Diagnosis not present

## 2017-03-06 DIAGNOSIS — F419 Anxiety disorder, unspecified: Secondary | ICD-10-CM | POA: Insufficient documentation

## 2017-03-06 DIAGNOSIS — Z85828 Personal history of other malignant neoplasm of skin: Secondary | ICD-10-CM | POA: Insufficient documentation

## 2017-03-06 HISTORY — DX: Pneumonia, unspecified organism: J18.9

## 2017-03-06 HISTORY — PX: ANTERIOR CERVICAL DECOMP/DISCECTOMY FUSION: SHX1161

## 2017-03-06 HISTORY — DX: Presence of automatic (implantable) cardiac defibrillator: Z95.810

## 2017-03-06 HISTORY — DX: Malignant (primary) neoplasm, unspecified: C80.1

## 2017-03-06 HISTORY — DX: Major depressive disorder, single episode, unspecified: F32.9

## 2017-03-06 HISTORY — DX: Depression, unspecified: F32.A

## 2017-03-06 LAB — GLUCOSE, CAPILLARY
GLUCOSE-CAPILLARY: 138 mg/dL — AB (ref 65–99)
GLUCOSE-CAPILLARY: 138 mg/dL — AB (ref 65–99)
Glucose-Capillary: 136 mg/dL — ABNORMAL HIGH (ref 65–99)
Glucose-Capillary: 89 mg/dL (ref 65–99)

## 2017-03-06 LAB — URINALYSIS, ROUTINE W REFLEX MICROSCOPIC
BILIRUBIN URINE: NEGATIVE
Bacteria, UA: NONE SEEN
GLUCOSE, UA: NEGATIVE mg/dL
Hgb urine dipstick: NEGATIVE
KETONES UR: NEGATIVE mg/dL
LEUKOCYTES UA: NEGATIVE
Nitrite: NEGATIVE
PH: 5 (ref 5.0–8.0)
PROTEIN: 30 mg/dL — AB
SQUAMOUS EPITHELIAL / LPF: NONE SEEN
Specific Gravity, Urine: 1.027 (ref 1.005–1.030)

## 2017-03-06 SURGERY — ANTERIOR CERVICAL DECOMPRESSION/DISCECTOMY FUSION 1 LEVEL
Anesthesia: General

## 2017-03-06 MED ORDER — POVIDONE-IODINE 7.5 % EX SOLN
Freq: Once | CUTANEOUS | Status: DC
  Filled 2017-03-06: qty 118

## 2017-03-06 MED ORDER — 0.9 % SODIUM CHLORIDE (POUR BTL) OPTIME
TOPICAL | Status: DC | PRN
  Administered 2017-03-06: 1000 mL

## 2017-03-06 MED ORDER — EPHEDRINE SULFATE-NACL 50-0.9 MG/10ML-% IV SOSY
PREFILLED_SYRINGE | INTRAVENOUS | Status: DC | PRN
  Administered 2017-03-06: 10 mg via INTRAVENOUS

## 2017-03-06 MED ORDER — ONDANSETRON HCL 4 MG/2ML IJ SOLN
INTRAMUSCULAR | Status: DC | PRN
  Administered 2017-03-06: 4 mg via INTRAVENOUS

## 2017-03-06 MED ORDER — PROPOFOL 10 MG/ML IV BOLUS
INTRAVENOUS | Status: AC
  Filled 2017-03-06: qty 20

## 2017-03-06 MED ORDER — OMEPRAZOLE MAGNESIUM 20 MG PO TBEC: 20.0000 mg | DELAYED_RELEASE_TABLET | Freq: Every day | ORAL | Status: DC | PRN

## 2017-03-06 MED ORDER — ROSUVASTATIN CALCIUM 20 MG PO TABS
20.0000 mg | ORAL_TABLET | Freq: Every day | ORAL | Status: DC
  Administered 2017-03-06 – 2017-03-07 (×2): 20 mg via ORAL
  Filled 2017-03-06 (×2): qty 1

## 2017-03-06 MED ORDER — ONDANSETRON HCL 4 MG/2ML IJ SOLN
INTRAMUSCULAR | Status: AC
  Filled 2017-03-06: qty 2

## 2017-03-06 MED ORDER — LABETALOL HCL 5 MG/ML IV SOLN
INTRAVENOUS | Status: AC
  Filled 2017-03-06: qty 4

## 2017-03-06 MED ORDER — PANTOPRAZOLE SODIUM 40 MG IV SOLR: 40.0000 mg | Freq: Every day | INTRAVENOUS | Status: DC

## 2017-03-06 MED ORDER — DOCUSATE SODIUM 100 MG PO CAPS
100.0000 mg | ORAL_CAPSULE | Freq: Two times a day (BID) | ORAL | Status: DC
  Administered 2017-03-06 – 2017-03-07 (×2): 100 mg via ORAL
  Filled 2017-03-06 (×3): qty 1

## 2017-03-06 MED ORDER — CEFAZOLIN SODIUM-DEXTROSE 2-4 GM/100ML-% IV SOLN
2.0000 g | INTRAVENOUS | Status: AC
  Administered 2017-03-06: 2 g via INTRAVENOUS

## 2017-03-06 MED ORDER — ALPRAZOLAM 0.5 MG PO TABS: 1.0000 mg | ORAL_TABLET | Freq: Two times a day (BID) | ORAL | Status: DC | PRN

## 2017-03-06 MED ORDER — SUFENTANIL CITRATE 50 MCG/ML IV SOLN
INTRAVENOUS | Status: AC
  Filled 2017-03-06: qty 1

## 2017-03-06 MED ORDER — FLEET ENEMA 7-19 GM/118ML RE ENEM: 1.0000 | ENEMA | Freq: Once | RECTAL | Status: DC | PRN

## 2017-03-06 MED ORDER — DIAZEPAM 5 MG PO TABS
5.0000 mg | ORAL_TABLET | Freq: Four times a day (QID) | ORAL | Status: DC | PRN
  Administered 2017-03-06 – 2017-03-07 (×3): 5 mg via ORAL
  Filled 2017-03-06 (×3): qty 1

## 2017-03-06 MED ORDER — LIRAGLUTIDE 18 MG/3ML ~~LOC~~ SOPN: 0.6000 mg | PEN_INJECTOR | Freq: Every day | SUBCUTANEOUS | Status: DC

## 2017-03-06 MED ORDER — LACTATED RINGERS IV SOLN
INTRAVENOUS | Status: DC | PRN
  Administered 2017-03-06: 08:00:00 via INTRAVENOUS

## 2017-03-06 MED ORDER — VENLAFAXINE HCL ER 75 MG PO CP24
150.0000 mg | ORAL_CAPSULE | Freq: Every day | ORAL | Status: DC
  Administered 2017-03-07: 150 mg via ORAL
  Filled 2017-03-06: qty 2

## 2017-03-06 MED ORDER — SODIUM CHLORIDE 0.9% FLUSH
3.0000 mL | Freq: Two times a day (BID) | INTRAVENOUS | Status: DC
  Administered 2017-03-06: 3 mL via INTRAVENOUS

## 2017-03-06 MED ORDER — DIGOXIN 125 MCG PO TABS
125.0000 ug | ORAL_TABLET | Freq: Every day | ORAL | Status: DC
  Administered 2017-03-07: 125 ug via ORAL
  Filled 2017-03-06 (×2): qty 1

## 2017-03-06 MED ORDER — THROMBIN (RECOMBINANT) 20000 UNITS EX SOLR
CUTANEOUS | Status: DC | PRN
  Administered 2017-03-06: 20000 [IU] via TOPICAL

## 2017-03-06 MED ORDER — ONDANSETRON HCL 4 MG/2ML IJ SOLN: 4.0000 mg | Freq: Four times a day (QID) | INTRAMUSCULAR | Status: DC | PRN

## 2017-03-06 MED ORDER — HYDROMORPHONE HCL 2 MG PO TABS
1.0000 mg | ORAL_TABLET | ORAL | Status: DC | PRN
  Administered 2017-03-06 – 2017-03-07 (×5): 2 mg via ORAL
  Filled 2017-03-06 (×5): qty 1

## 2017-03-06 MED ORDER — INSULIN ASPART 100 UNIT/ML ~~LOC~~ SOLN: 0.0000 [IU] | Freq: Three times a day (TID) | SUBCUTANEOUS | Status: DC

## 2017-03-06 MED ORDER — SUGAMMADEX SODIUM 200 MG/2ML IV SOLN
INTRAVENOUS | Status: DC | PRN
  Administered 2017-03-06: 100 mg via INTRAVENOUS

## 2017-03-06 MED ORDER — LIDOCAINE 2% (20 MG/ML) 5 ML SYRINGE
INTRAMUSCULAR | Status: AC
  Filled 2017-03-06: qty 15

## 2017-03-06 MED ORDER — THROMBIN (RECOMBINANT) 20000 UNITS EX SOLR
CUTANEOUS | Status: AC
  Filled 2017-03-06: qty 20000

## 2017-03-06 MED ORDER — PROMETHAZINE HCL 25 MG/ML IJ SOLN: 12.5000 mg | Freq: Once | INTRAMUSCULAR | Status: DC | PRN

## 2017-03-06 MED ORDER — CEFAZOLIN SODIUM-DEXTROSE 2-4 GM/100ML-% IV SOLN
INTRAVENOUS | Status: AC
  Filled 2017-03-06: qty 100

## 2017-03-06 MED ORDER — ALUM & MAG HYDROXIDE-SIMETH 200-200-20 MG/5ML PO SUSP: 30.0000 mL | Freq: Four times a day (QID) | ORAL | Status: DC | PRN

## 2017-03-06 MED ORDER — ONDANSETRON HCL 4 MG PO TABS: 4.0000 mg | ORAL_TABLET | Freq: Four times a day (QID) | ORAL | Status: DC | PRN

## 2017-03-06 MED ORDER — KETOROLAC TROMETHAMINE 30 MG/ML IJ SOLN: 30.0000 mg | Freq: Once | INTRAMUSCULAR | Status: DC | PRN

## 2017-03-06 MED ORDER — SENNOSIDES-DOCUSATE SODIUM 8.6-50 MG PO TABS: 1.0000 | ORAL_TABLET | Freq: Every evening | ORAL | Status: DC | PRN

## 2017-03-06 MED ORDER — BUPIVACAINE-EPINEPHRINE 0.25% -1:200000 IJ SOLN
INTRAMUSCULAR | Status: AC
  Filled 2017-03-06: qty 1

## 2017-03-06 MED ORDER — BUPIVACAINE-EPINEPHRINE 0.25% -1:200000 IJ SOLN
INTRAMUSCULAR | Status: DC | PRN
  Administered 2017-03-06: 10 mL

## 2017-03-06 MED ORDER — SUGAMMADEX SODIUM 200 MG/2ML IV SOLN
INTRAVENOUS | Status: AC
  Filled 2017-03-06: qty 2

## 2017-03-06 MED ORDER — MENTHOL 3 MG MT LOZG: 1.0000 | LOZENGE | OROMUCOSAL | Status: DC | PRN

## 2017-03-06 MED ORDER — CARVEDILOL 25 MG PO TABS
25.0000 mg | ORAL_TABLET | Freq: Two times a day (BID) | ORAL | Status: DC
  Administered 2017-03-06 – 2017-03-07 (×2): 25 mg via ORAL
  Filled 2017-03-06 (×2): qty 1

## 2017-03-06 MED ORDER — INSULIN ASPART PROT & ASPART (70-30 MIX) 100 UNIT/ML ~~LOC~~ SUSP
50.0000 [IU] | Freq: Two times a day (BID) | SUBCUTANEOUS | Status: DC
  Administered 2017-03-06: 50 [IU] via SUBCUTANEOUS
  Administered 2017-03-07: 65 [IU] via SUBCUTANEOUS
  Filled 2017-03-06: qty 10

## 2017-03-06 MED ORDER — MEPERIDINE HCL 50 MG/ML IJ SOLN: 6.2500 mg | INTRAMUSCULAR | Status: DC | PRN

## 2017-03-06 MED ORDER — ZOLPIDEM TARTRATE 5 MG PO TABS: 5.0000 mg | ORAL_TABLET | Freq: Every evening | ORAL | Status: DC | PRN

## 2017-03-06 MED ORDER — METFORMIN HCL ER 500 MG PO TB24
500.0000 mg | ORAL_TABLET | Freq: Two times a day (BID) | ORAL | Status: DC
  Administered 2017-03-06 – 2017-03-07 (×2): 500 mg via ORAL
  Filled 2017-03-06 (×2): qty 1

## 2017-03-06 MED ORDER — SUFENTANIL CITRATE 50 MCG/ML IV SOLN
INTRAVENOUS | Status: DC | PRN
  Administered 2017-03-06: 30 ug via INTRAVENOUS
  Administered 2017-03-06: 10 ug via INTRAVENOUS

## 2017-03-06 MED ORDER — EPHEDRINE 5 MG/ML INJ
INTRAVENOUS | Status: AC
  Filled 2017-03-06: qty 10

## 2017-03-06 MED ORDER — HYDROMORPHONE HCL 1 MG/ML IJ SOLN: 0.2500 mg | INTRAMUSCULAR | Status: DC | PRN

## 2017-03-06 MED ORDER — ROCURONIUM BROMIDE 10 MG/ML (PF) SYRINGE
PREFILLED_SYRINGE | INTRAVENOUS | Status: DC | PRN
  Administered 2017-03-06: 20 mg via INTRAVENOUS
  Administered 2017-03-06: 10 mg via INTRAVENOUS
  Administered 2017-03-06: 50 mg via INTRAVENOUS
  Administered 2017-03-06 (×2): 10 mg via INTRAVENOUS

## 2017-03-06 MED ORDER — ROCURONIUM BROMIDE 10 MG/ML (PF) SYRINGE
PREFILLED_SYRINGE | INTRAVENOUS | Status: AC
  Filled 2017-03-06: qty 10

## 2017-03-06 MED ORDER — CEFAZOLIN SODIUM-DEXTROSE 2-4 GM/100ML-% IV SOLN
2.0000 g | Freq: Three times a day (TID) | INTRAVENOUS | Status: AC
  Administered 2017-03-06 (×2): 2 g via INTRAVENOUS
  Filled 2017-03-06 (×3): qty 100

## 2017-03-06 MED ORDER — MIDAZOLAM HCL 5 MG/5ML IJ SOLN
INTRAMUSCULAR | Status: DC | PRN
  Administered 2017-03-06: 2 mg via INTRAVENOUS

## 2017-03-06 MED ORDER — MIDAZOLAM HCL 2 MG/2ML IJ SOLN
INTRAMUSCULAR | Status: AC
  Filled 2017-03-06: qty 2

## 2017-03-06 MED ORDER — SODIUM CHLORIDE 0.9% FLUSH: 3.0000 mL | INTRAVENOUS | Status: DC | PRN

## 2017-03-06 MED ORDER — LIDOCAINE 2% (20 MG/ML) 5 ML SYRINGE
INTRAMUSCULAR | Status: DC | PRN
  Administered 2017-03-06: 100 mg via INTRAVENOUS

## 2017-03-06 MED ORDER — FUROSEMIDE 40 MG PO TABS: 40.0000 mg | ORAL_TABLET | ORAL | Status: DC | PRN

## 2017-03-06 MED ORDER — ACETAMINOPHEN 650 MG RE SUPP: 650.0000 mg | RECTAL | Status: DC | PRN

## 2017-03-06 MED ORDER — ACETAMINOPHEN 325 MG PO TABS: 650.0000 mg | ORAL_TABLET | ORAL | Status: DC | PRN

## 2017-03-06 MED ORDER — PHENOL 1.4 % MT LIQD
1.0000 | OROMUCOSAL | Status: DC | PRN
  Administered 2017-03-06: 1 via OROMUCOSAL
  Filled 2017-03-06: qty 177

## 2017-03-06 MED ORDER — LISINOPRIL 20 MG PO TABS
20.0000 mg | ORAL_TABLET | Freq: Two times a day (BID) | ORAL | Status: DC
  Administered 2017-03-06 – 2017-03-07 (×3): 20 mg via ORAL
  Filled 2017-03-06 (×3): qty 1

## 2017-03-06 MED ORDER — OMEGA-3-ACID ETHYL ESTERS 1 G PO CAPS
2000.0000 mg | ORAL_CAPSULE | Freq: Two times a day (BID) | ORAL | Status: DC
  Administered 2017-03-07: 2000 mg via ORAL
  Filled 2017-03-06 (×2): qty 2

## 2017-03-06 MED ORDER — PANTOPRAZOLE SODIUM 40 MG PO TBEC
40.0000 mg | DELAYED_RELEASE_TABLET | Freq: Every day | ORAL | Status: DC
  Administered 2017-03-06: 40 mg via ORAL
  Filled 2017-03-06: qty 1

## 2017-03-06 MED ORDER — PROPOFOL 10 MG/ML IV BOLUS
INTRAVENOUS | Status: DC | PRN
  Administered 2017-03-06: 200 mg via INTRAVENOUS

## 2017-03-06 MED ORDER — BISACODYL 5 MG PO TBEC: 5.0000 mg | DELAYED_RELEASE_TABLET | Freq: Every day | ORAL | Status: DC | PRN

## 2017-03-06 SURGICAL SUPPLY — 74 items
APL SKNCLS STERI-STRIP NONHPOA (GAUZE/BANDAGES/DRESSINGS) ×1
BENZOIN TINCTURE PRP APPL 2/3 (GAUZE/BANDAGES/DRESSINGS) ×2 IMPLANT
BIT DRILL NEURO 2X3.1 SFT TUCH (MISCELLANEOUS) ×1 IMPLANT
BIT DRILL SRG 14X2.2XFLT CHK (BIT) IMPLANT
BIT DRL SRG 14X2.2XFLT CHK (BIT) ×2
BLADE CLIPPER SURG (BLADE) ×2 IMPLANT
BLADE SURG 15 STRL LF DISP TIS (BLADE) ×1 IMPLANT
BLADE SURG 15 STRL SS (BLADE) ×2
BONE VIVIGEN FORMABLE 1.3CC (Bone Implant) ×2 IMPLANT
BUR MATCHSTICK NEURO 3.0 LAGG (BURR) IMPLANT
CARTRIDGE OIL MAESTRO DRILL (MISCELLANEOUS) ×1 IMPLANT
CORDS BIPOLAR (ELECTRODE) ×2 IMPLANT
COVER SURGICAL LIGHT HANDLE (MISCELLANEOUS) ×2 IMPLANT
CRADLE DONUT ADULT HEAD (MISCELLANEOUS) ×2 IMPLANT
DIFFUSER DRILL AIR PNEUMATIC (MISCELLANEOUS) ×2 IMPLANT
DRAIN JACKSON RD 7FR 3/32 (WOUND CARE) IMPLANT
DRAPE C-ARM 42X72 X-RAY (DRAPES) ×2 IMPLANT
DRAPE POUCH INSTRU U-SHP 10X18 (DRAPES) ×2 IMPLANT
DRAPE SURG 17X23 STRL (DRAPES) ×6 IMPLANT
DRILL BIT SKYLINE 14MM (BIT) ×4
DRILL NEURO 2X3.1 SOFT TOUCH (MISCELLANEOUS) ×2
DURAPREP 26ML APPLICATOR (WOUND CARE) ×2 IMPLANT
ELECT COATED BLADE 2.86 ST (ELECTRODE) ×2 IMPLANT
ELECT REM PT RETURN 9FT ADLT (ELECTROSURGICAL) ×2
ELECTRODE REM PT RTRN 9FT ADLT (ELECTROSURGICAL) ×1 IMPLANT
EVACUATOR SILICONE 100CC (DRAIN) IMPLANT
GAUZE SPONGE 4X4 12PLY STRL (GAUZE/BANDAGES/DRESSINGS) ×2 IMPLANT
GAUZE SPONGE 4X4 16PLY XRAY LF (GAUZE/BANDAGES/DRESSINGS) ×2 IMPLANT
GLOVE BIO SURGEON STRL SZ7 (GLOVE) ×2 IMPLANT
GLOVE BIO SURGEON STRL SZ8 (GLOVE) ×2 IMPLANT
GLOVE BIOGEL PI IND STRL 7.0 (GLOVE) ×2 IMPLANT
GLOVE BIOGEL PI IND STRL 8 (GLOVE) ×1 IMPLANT
GLOVE BIOGEL PI INDICATOR 7.0 (GLOVE) ×2
GLOVE BIOGEL PI INDICATOR 8 (GLOVE) ×1
GOWN STRL REUS W/ TWL LRG LVL3 (GOWN DISPOSABLE) ×1 IMPLANT
GOWN STRL REUS W/ TWL XL LVL3 (GOWN DISPOSABLE) ×1 IMPLANT
GOWN STRL REUS W/TWL LRG LVL3 (GOWN DISPOSABLE) ×2
GOWN STRL REUS W/TWL XL LVL3 (GOWN DISPOSABLE) ×2
GRAFT BNE MATRIX VG FRMBL SM 1 (Bone Implant) IMPLANT
INTERLOCK LRDTC CRVCL VBR 7MM (Bone Implant) IMPLANT
IV CATH 14GX2 1/4 (CATHETERS) ×2 IMPLANT
KIT BASIN OR (CUSTOM PROCEDURE TRAY) ×2 IMPLANT
KIT ROOM TURNOVER OR (KITS) ×2 IMPLANT
LORDOTIC CERVICAL VBR 7MM SM (Bone Implant) ×2 IMPLANT
MANIFOLD NEPTUNE II (INSTRUMENTS) ×2 IMPLANT
NDL PRECISIONGLIDE 27X1.5 (NEEDLE) ×1 IMPLANT
NDL SPNL 20GX3.5 QUINCKE YW (NEEDLE) ×1 IMPLANT
NEEDLE PRECISIONGLIDE 27X1.5 (NEEDLE) ×2 IMPLANT
NEEDLE SPNL 20GX3.5 QUINCKE YW (NEEDLE) ×2 IMPLANT
NS IRRIG 1000ML POUR BTL (IV SOLUTION) ×2 IMPLANT
OIL CARTRIDGE MAESTRO DRILL (MISCELLANEOUS) ×2
PACK ORTHO CERVICAL (CUSTOM PROCEDURE TRAY) ×2 IMPLANT
PAD ARMBOARD 7.5X6 YLW CONV (MISCELLANEOUS) ×4 IMPLANT
PATTIES SURGICAL .5 X.5 (GAUZE/BANDAGES/DRESSINGS) IMPLANT
PATTIES SURGICAL .5 X1 (DISPOSABLE) IMPLANT
PIN DISTRACTION 14 (PIN) ×1 IMPLANT
PLATE ONE LEVEL SKYLINE 14MM (Plate) ×1 IMPLANT
SCREW SKYLINE VAR OS 14MM (Screw) ×4 IMPLANT
SPONGE INTESTINAL PEANUT (DISPOSABLE) ×2 IMPLANT
SPONGE SURGIFOAM ABS GEL 100 (HEMOSTASIS) IMPLANT
STRIP CLOSURE SKIN 1/2X4 (GAUZE/BANDAGES/DRESSINGS) ×2 IMPLANT
SURGIFLO W/THROMBIN 8M KIT (HEMOSTASIS) IMPLANT
SUT MNCRL AB 4-0 PS2 18 (SUTURE) ×1 IMPLANT
SUT SILK 4 0 (SUTURE)
SUT SILK 4-0 18XBRD TIE 12 (SUTURE) IMPLANT
SUT VIC AB 2-0 CT2 18 VCP726D (SUTURE) ×3 IMPLANT
SYR BULB IRRIGATION 50ML (SYRINGE) ×2 IMPLANT
SYR CONTROL 10ML LL (SYRINGE) ×4 IMPLANT
TAPE CLOTH 4X10 WHT NS (GAUZE/BANDAGES/DRESSINGS) ×2 IMPLANT
TAPE UMBILICAL COTTON 1/8X30 (MISCELLANEOUS) ×2 IMPLANT
TOWEL OR 17X24 6PK STRL BLUE (TOWEL DISPOSABLE) ×2 IMPLANT
TOWEL OR 17X26 10 PK STRL BLUE (TOWEL DISPOSABLE) ×2 IMPLANT
WATER STERILE IRR 1000ML POUR (IV SOLUTION) ×2 IMPLANT
YANKAUER SUCT BULB TIP NO VENT (SUCTIONS) ×2 IMPLANT

## 2017-03-06 NOTE — Op Note (Signed)
NAME:  Justin Ray              MEDICAL RECORD NO.:  [id number]  PHYSICIAN:  Phylliss Bob, MD      DATE OF BIRTH:  04/06/1965  DATE OF PROCEDURE:  03/06/2017                              OPERATIVE REPORT   PREOPERATIVE DIAGNOSES: 1. Left-sided cervical radiculopathy. 2. Spinal stenosis spanning C6/7.  POSTOPERATIVE DIAGNOSES: 1. Left-sided cervical radiculopathy. 2. Spinal stenosis spanning C6/7.  PROCEDURE: 1. Anterior cervical decompression and fusion C6/7. 2. Placement of anterior instrumentation, C6/7. 3. Insertion of interbody device x1 (48mm Titan intervertebral spacer). 4. Intraoperative use of fluoroscopy. 5. Use of morselized allograft - ViviGen.  SURGEON:  Phylliss Bob, MD  ASSISTANT:  Pricilla Holm, PA-C.  ANESTHESIA:  General endotracheal anesthesia.  COMPLICATIONS:  None.  DISPOSITION:  Stable.  ESTIMATED BLOOD LOSS:  Minimal.  INDICATIONS FOR SURGERY:  Briefly, Mr Hamor is a pleasant 52 year old patient, who did present to me with severe pain in his neck and left arm.   The patient's CT scan did reveal the findings noted above.  Given the ongoing rather debilitating pain and lack of improvement with appropriate treatment measures, we did discuss proceeding with the procedure noted above.  The patient was fully aware of the risks and limitations of surgery as outlined in my preoperative note.  OPERATIVE DETAILS:  On 03/06/2017, the patient was brought to surgery and general endotracheal anesthesia was administered.  The patient was placed supine on the hospital bed. The neck was gently extended.  All bony prominences were meticulously padded.  The neck was prepped and draped in the usual sterile fashion.  At this point, I did make a left-sided transverse incision.  The platysma was incised.  A Smith-Robinson approach was used and the anterior spine was identified. A self-retaining retractor was placed.  I then subperiosteally  exposed the vertebral bodies from C6-C7.  Caspar pins were then placed into the C6 and C7 vertebral bodies and distraction was applied.  A thorough and complete C6-7 intervertebral diskectomy was performed.  The posterior longitudinal ligament was identified and entered using a nerve hook.  I then used #1 followed by #2 Kerrison to perform a thorough and complete intervertebral diskectomy.  The spinal canal was thoroughly decompressed, as was the left neuroforamen.  The endplates were then prepared and the appropriate-sized intervertebral spacer was then packed with ViviGen and tamped into position in the usual fashion. The Caspar pins  then were removed and bone wax was placed in their place.  The appropriate-sized anterior cervical plate was placed over the anterior spine.  14 mm variable angle screws were placed, 2 in each vertebral body from C6-C7 for a total of 4 vertebral body screws.  The screws were then locked to the plate using the Cam locking mechanism.  I was very pleased with the final fluoroscopic images.  The wound was then irrigated.  The wound was then explored for any undue bleeding and there was no bleeding noted. The wound was then closed in layers using 2-0 Vicryl, followed by 4-0 Monocryl.  Benzoin and Steri-Strips were applied, followed by sterile dressing.  All instrument counts were correct at the termination of the procedure.  Of note, Pricilla Holm, PA-C, was my assistant throughout surgery, and did aid in retraction, suctioning, and closure from start to finish.  Phylliss Bob, MD

## 2017-03-06 NOTE — Transfer of Care (Signed)
Immediate Anesthesia Transfer of Care Note  Patient: Justin Ray  Procedure(s) Performed: ANTERIOR CERVICAL DECOMPRESSION FUSION,CERVICAL 6-7 WITH INSTRUMENTATION AND ALLOGRAFT REQUESTED TIME 2.5 HRS (N/A )  Patient Location: PACU  Anesthesia Type:General  Level of Consciousness: awake  Airway & Oxygen Therapy: Patient Spontanous Breathing and Patient connected to nasal cannula oxygen  Post-op Assessment: Report given to RN, Post -op Vital signs reviewed and stable and Patient moving all extremities  Post vital signs: Reviewed and stable  Last Vitals:  Vitals:   03/06/17 0639  BP: (!) 152/90  Pulse: 80  Resp: 20  Temp: 36.8 C  SpO2: 97%    Last Pain:  Vitals:   03/06/17 0639  TempSrc: Oral      Patients Stated Pain Goal: 2 (12/81/18 8677)  Complications: No apparent anesthesia complications

## 2017-03-06 NOTE — Anesthesia Procedure Notes (Signed)
Procedure Name: Intubation Date/Time: 03/06/2017 8:36 AM Performed by: Moshe Salisbury, CRNA Pre-anesthesia Checklist: Patient identified, Emergency Drugs available, Suction available and Patient being monitored Patient Re-evaluated:Patient Re-evaluated prior to induction Oxygen Delivery Method: Circle System Utilized Preoxygenation: Pre-oxygenation with 100% oxygen Induction Type: IV induction Ventilation: Mask ventilation without difficulty Laryngoscope Size: Glidescope and 4 Grade View: Grade I Tube type: Oral Tube size: 8.0 mm Number of attempts: 1 Airway Equipment and Method: Rigid stylet and Video-laryngoscopy Placement Confirmation: ETT inserted through vocal cords under direct vision,  positive ETCO2 and breath sounds checked- equal and bilateral Secured at: 21 cm Tube secured with: Tape Dental Injury: Teeth and Oropharynx as per pre-operative assessment

## 2017-03-06 NOTE — Anesthesia Postprocedure Evaluation (Signed)
Anesthesia Post Note  Patient: Justin Ray  Procedure(s) Performed: ANTERIOR CERVICAL DECOMPRESSION FUSION,CERVICAL 6-7 WITH INSTRUMENTATION AND ALLOGRAFT REQUESTED TIME 2.5 HRS (N/A )     Patient location during evaluation: PACU Anesthesia Type: General Level of consciousness: awake and sedated Pain management: pain level controlled Vital Signs Assessment: post-procedure vital signs reviewed and stable Respiratory status: spontaneous breathing Cardiovascular status: stable Postop Assessment: no apparent nausea or vomiting Anesthetic complications: no    Last Vitals:  Vitals:   03/06/17 1200 03/06/17 1207  BP:  (!) 155/89  Pulse: 80 78  Resp: 20 18  Temp:    SpO2: 94% 94%    Last Pain:  Vitals:   03/06/17 1115  TempSrc:   PainSc: Asleep   Pain Goal: Patients Stated Pain Goal: 2 (03/06/17 0729)               Davelyn Gwinn JR,JOHN Mateo Flow

## 2017-03-06 NOTE — H&P (Signed)
PREOPERATIVE H&P  Chief Complaint: Left arm pain  HPI: Justin Ray is a 52 y.o. male who presents with ongoing pain in the left arm   MRI reveals NF stenosis on the left at C6/7  Patient has failed multiple forms of conservative care and continues to have pain (see office notes for additional details regarding the patient's full course of treatment)  Past Medical History:  Diagnosis Date  . Anxiety   . Cancer ([place])    skin cancer  . Cardiomyopathy   . Carpal tunnel syndrome 10/14   right-GSC  . Cataract   . CHF (congestive heart failure) ([place])   . Depression   . Diabetes mellitus   . Dyslipidemia   . Hypertension   . ICD (implantable cardioverter-defibrillator) in place   . Pneumonia   . Sleep apnea    was tested many yr ago-does not use a cpap-  . Wears glasses    Past Surgical History:  Procedure Laterality Date  . CARDIAC DEFIBRILLATOR PLACEMENT  02/01/2004  . CARDIAC DEFIBRILLATOR PLACEMENT  11/14   replaced with new one  . CARPAL TUNNEL RELEASE Left 12/19/2012   Procedure: LEFT CARPAL TUNNEL RELEASE;  Surgeon: Roseanne Kaufman, MD;  Location: [facility];  Service: Orthopedics;  Laterality: Left;  . CARPAL TUNNEL RELEASE Right   . EYE SURGERY Right    cataract surgery with lens implants  . IMPLANTABLE CARDIOVERTER DEFIBRILLATOR (ICD) GENERATOR CHANGE N/A 11/21/2012   Procedure: ICD GENERATOR CHANGE;  Surgeon: Evans Lance, MD;  Location: [facility] C[facility] LAB;  Service: Cardiovascular;  Laterality: N/A;  . TONSILLECTOMY     Social History   Socioeconomic History  . Marital status: Widowed    Spouse name: None  . Number of children: 0  . Years of education: None  . Highest education level: None  Social Needs  . Financial resource strain: None  . Food insecurity - worry: None  . Food insecurity - inability: None  . Transportation needs - medical: None  . Transportation needs - non-medical: None  Occupational History  . Occupation: maintenance     Employer: UNEMPLOYED  Tobacco Use  . Smoking status: Current Every Day Smoker    Packs/day: 1.50    Types: Cigarettes  . Smokeless tobacco: Never Used  Substance and Sexual Activity  . Alcohol use: No  . Drug use: No  . Sexual activity: No    Comment: number of sex partners in the last 12 months  0  Other Topics Concern  . None  Social History Narrative  . None   Family History  Problem Relation Age of Onset  . Coronary artery disease Father        CABG  . Cancer Mother        colon  . Heart disease Brother   . Cancer Unknown    Allergies  Allergen Reactions  . Codeine Shortness Of Breath and Other (See Comments)    Severe HA's.  . Niaspan [Niacin Er] Other (See Comments)    Per pt feels like skin is burning( REDNESS), and pins/needles are sticking   . Sulfonamide Derivatives Other (See Comments)    thrush   Prior to Admission medications   Medication Sig Start Date End Date Taking? Authorizing Provider  acetaminophen (TYLENOL) 500 MG tablet Take 1,000 mg by mouth daily as needed for moderate pain or headache.   Yes [provider]  aspirin 81 MG tablet Take 81 mg by mouth daily.  Yes [provider]  carvedilol (COREG) 25 MG tablet take 1 tablet by mouth twice a day WITH A MEAL 06/18/16  Yes Troy Sine, MD  desvenlafaxine (PRISTIQ) 100 MG 24 hr tablet TAKE 1 TABLET BY MOUTH ONCE DAILY. 05/04/15  Yes Leandrew Koyanagi, MD  digoxin (DIGOX) 0.125 MG tablet Take 1 tablet (125 mcg total) by mouth daily. 06/25/16  Yes Evans Lance, MD  furosemide (LASIX) 40 MG tablet Take 1 tablet (40 mg total) by mouth as needed. Patient taking differently: Take 40 mg by mouth as needed for edema.  05/04/15  Yes Leandrew Koyanagi, MD  insulin NPH-regular Human (HUMULIN 70/30) (70-30) 100 UNIT/ML injection inject 65 units subcutaneously every morning and inject 50-55 units subcutaneously every evening Patient taking differently: inject 65 units subcutaneously  every morning and inject 55 units subcutaneously every evening 05/04/15  Yes Leandrew Koyanagi, MD  lisinopril (PRINIVIL,ZESTRIL) 20 MG tablet Take 1 tablet (20 mg total) by mouth 2 (two) times daily. 06/25/16  Yes Evans Lance, MD  metFORMIN (GLUCOPHAGE-XR) 500 MG 24 hr tablet Take 1,000 tablets by mouth 2 (two) times daily.  05/18/16  Yes [provider]  omeprazole (PRILOSEC OTC) 20 MG tablet Take 20 mg by mouth daily as needed (acid reflux).   Yes [provider]  rosuvastatin (CRESTOR) 20 MG tablet take 1 tablet by mouth once daily 07/19/16  Yes Troy Sine, MD  VASCEPA 1 g CAPS take 2 capsules by mouth twice a day 12/10/16  Yes Troy Sine, MD  VICTOZA 18 MG/3ML SOPN inject 0.6 milligram subcutaneously once daily 12/10/16  Yes [provider]  ALPRAZolam Duanne Moron) 1 MG tablet Take 1 tablet (1 mg total) by mouth 2 (two) times daily as needed for anxiety (for anxiety). Patient not taking: Reported on 03/01/2017 05/04/15   Leandrew Koyanagi, MD  blood glucose meter kit and supplies KIT Test blood sugar 3 times daily. Dx code: E11.65 04/05/14   Leandrew Koyanagi, MD     All other systems have been reviewed and were otherwise negative with the exception of those mentioned in the HPI and as above.  Physical Exam: Vitals:   03/06/17 0639  BP: (!) 152/90  Pulse: 80  Resp: 20  Temp: 98.3 F (36.8 C)  SpO2: 97%    Body mass index is 28.28 kg/m.  General: Alert, no acute distress Cardiovascular: No pedal edema Respiratory: No cyanosis, no use of accessory musculature Skin: No lesions in the area of chief complaint Neurologic: Sensation intact distally Psychiatric: Patient is competent for consent with normal mood and affect Lymphatic: No axillary or cervical lymphadenopathy  MUSCULOSKELETAL: + spurling sign on the left  Assessment/Plan: LEFT ARM PAIN GREATER THAN NECK PAIN Plan for Procedure(s): ANTERIOR CERVICAL DECOMPRESSION FUSION,CERVICAL 6-7  WITH INSTRUMENTATION AND ALLOGRAFT   Sinclair Ship, MD 03/06/2017 6:52 AM

## 2017-03-07 ENCOUNTER — Other Ambulatory Visit: Payer: Self-pay

## 2017-03-07 DIAGNOSIS — M4802 Spinal stenosis, cervical region: Secondary | ICD-10-CM | POA: Diagnosis not present

## 2017-03-07 LAB — GLUCOSE, CAPILLARY: Glucose-Capillary: 114 mg/dL — ABNORMAL HIGH (ref 65–99)

## 2017-03-07 NOTE — Progress Notes (Signed)
Patient is discharged from room 3C03 at this time. Alert and in stable condition. IV site d/c'd and instructions read to patient with understanding verbalized. Left unit via wheelchair with all belongings at side. 

## 2017-03-07 NOTE — Progress Notes (Signed)
    Patient doing well  Patient denies arm pain Has been tolerating PO well   Physical Exam: Vitals:   03/06/17 2351 03/07/17 0412  BP: (!) 158/87 (!) 141/84  Pulse: 89 91  Resp: 20 18  Temp: 99.5 F (37.5 C) 98.9 F (37.2 C)  SpO2: 98% 94%    Dressing in place NVI Neck soft/supple  POD #1 s/p C6/7 ACDF, doing well  - encourage ambulation - Percocet for pain, Valium for muscle spasms - d/c home today with f/u in 2 weeks

## 2017-03-08 ENCOUNTER — Encounter (HOSPITAL_COMMUNITY): Payer: Self-pay | Admitting: Orthopedic Surgery

## 2017-03-12 DIAGNOSIS — L219 Seborrheic dermatitis, unspecified: Secondary | ICD-10-CM | POA: Diagnosis not present

## 2017-03-12 DIAGNOSIS — L570 Actinic keratosis: Secondary | ICD-10-CM | POA: Diagnosis not present

## 2017-03-12 DIAGNOSIS — D0461 Carcinoma in situ of skin of right upper limb, including shoulder: Secondary | ICD-10-CM | POA: Diagnosis not present

## 2017-03-14 ENCOUNTER — Encounter (HOSPITAL_COMMUNITY): Payer: Self-pay | Admitting: Orthopedic Surgery

## 2017-03-16 NOTE — Discharge Summary (Signed)
Patient ID: Justin Ray MRN: [id number] DOB/AGE: [birth year] 52 y.o.  Admit date: 03/06/2017 Discharge date: 03/07/2017  Admission Diagnoses:  Active Problems:   Radiculopathy   Discharge Diagnoses:  Same  Past Medical History:  Diagnosis Date  . AICD (automatic cardioverter/defibrillator) present   . Anxiety   . Cancer ([place])    skin cancer  . Cardiomyopathy   . Carpal tunnel syndrome 10/14   right-GSC  . Cataract   . CHF (congestive heart failure) ([place])   . Depression   . Diabetes mellitus   . Dyslipidemia   . Hypertension   . ICD (implantable cardioverter-defibrillator) in place   . Pneumonia   . Sleep apnea    was tested many yr ago-does not use a cpap-  . Wears glasses     Surgeries: Procedure(s): ANTERIOR CERVICAL DECOMPRESSION FUSION,CERVICAL 6-7 WITH INSTRUMENTATION AND ALLOGRAFT REQUESTED TIME 2.5 HRS on 03/06/2017   Consultants: None  Discharged Condition: Improved  Hospital Course: Justin Ray is an 52 y.o. male who was admitted 03/06/2017 for operative treatment of radiculopathy. Patient has severe unremitting pain that affects sleep, daily activities, and work/hobbies. After pre-op clearance the patient was taken to the operating room on 03/06/2017 and underwent  Procedure(s): ANTERIOR CERVICAL DECOMPRESSION FUSION,CERVICAL 6-7 WITH INSTRUMENTATION AND ALLOGRAFT REQUESTED TIME 2.5 HRS.    Patient was given perioperative antibiotics:  Anti-infectives (From admission, onward)   Start     Dose/Rate Route Frequency Ordered Stop   03/06/17 1300  ceFAZolin (ANCEF) IVPB 2g/100 mL premix     2 g 200 mL/hr over 30 Minutes Intravenous Every 8 hours 03/06/17 1249 03/06/17 2257   03/06/17 0716  ceFAZolin (ANCEF) 2-4 GM/100ML-% IVPB    Comments:  Nyoka Cowden   : cabinet override      03/06/17 0716 03/06/17 0822   03/06/17 0708  ceFAZolin (ANCEF) IVPB 2g/100 mL premix     2 g 200 mL/hr over 30 Minutes Intravenous On call to O.R. 03/06/17 4332 03/06/17  [id number]       Patient was given sequential compression devices, early ambulation to prevent DVT.  Patient benefited maximally from hospital stay and there were no complications.    Recent vital signs: BP (!) 148/89   Pulse 79   Temp 99 F (37.2 C)   Resp 18   Ht '5\' 8"'$  (1.727 m)   Wt 84.4 kg (186 lb)   SpO2 95%   BMI 28.28 kg/m    Discharge Medications:   Allergies as of 03/07/2017      Reactions   Codeine Shortness Of Breath, Other (See Comments)   Severe HA's.   Niaspan [niacin Er] Other (See Comments)   Per pt feels like skin is burning( REDNESS), and pins/needles are sticking    Sulfonamide Derivatives Other (See Comments)   thrush      Medication List    STOP taking these medications   acetaminophen 500 MG tablet Commonly known as:  TYLENOL     TAKE these medications   ALPRAZolam 1 MG tablet Commonly known as:  XANAX Take 1 tablet (1 mg total) by mouth 2 (two) times daily as needed for anxiety (for anxiety).   aspirin 81 MG tablet Take 81 mg by mouth daily.   blood glucose meter kit and supplies Kit Test blood sugar 3 times daily. Dx code: E11.65   carvedilol 25 MG tablet Commonly known as:  COREG take 1 tablet by mouth twice a day WITH A MEAL   desvenlafaxine 100 MG  24 hr tablet Commonly known as:  PRISTIQ TAKE 1 TABLET BY MOUTH ONCE DAILY.   digoxin 0.125 MG tablet Commonly known as:  DIGOX Take 1 tablet (125 mcg total) by mouth daily.   furosemide 40 MG tablet Commonly known as:  LASIX Take 1 tablet (40 mg total) by mouth as needed. What changed:  reasons to take this   insulin NPH-regular Human (70-30) 100 UNIT/ML injection Commonly known as:  HUMULIN 70/30 inject 65 units subcutaneously every morning and inject 50-55 units subcutaneously every evening What changed:  additional instructions   lisinopril 20 MG tablet Commonly known as:  PRINIVIL,ZESTRIL Take 1 tablet (20 mg total) by mouth 2 (two) times daily.   metFORMIN 500 MG 24 hr  tablet Commonly known as:  GLUCOPHAGE-XR Take 1,000 tablets by mouth 2 (two) times daily.   omeprazole 20 MG tablet Commonly known as:  PRILOSEC OTC Take 20 mg by mouth daily as needed (acid reflux).   rosuvastatin 20 MG tablet Commonly known as:  CRESTOR take 1 tablet by mouth once daily   VASCEPA 1 g Caps Generic drug:  Icosapent Ethyl take 2 capsules by mouth twice a day   VICTOZA 18 MG/3ML Sopn Generic drug:  liraglutide inject 0.6 milligram subcutaneously once daily       Diagnostic Studies: Dg Cervical Spine 2-3 Views  Result Date: 03/06/2017 CLINICAL DATA:  ACDF C6-7 EXAM: DG C-ARM 61-120 MIN; CERVICAL SPINE - 2-3 VIEW COMPARISON:  05/21/2012 FINDINGS: Intraoperative spot images demonstrate changes of anterior fusion at C6-7. The C7 vertebral body is difficult to visualize due to overlying shoulders. No visible hardware complicating feature. IMPRESSION: ACDF C6-7. No visible complicating feature. C7 not well visualized due to overlying shoulders. Electronically Signed   By: Rolm Baptise M.D.   On: 03/06/2017 10:51   Dg C-arm 1-60 Min  Result Date: 03/06/2017 CLINICAL DATA:  ACDF C6-7 EXAM: DG C-ARM 61-120 MIN; CERVICAL SPINE - 2-3 VIEW COMPARISON:  05/21/2012 FINDINGS: Intraoperative spot images demonstrate changes of anterior fusion at C6-7. The C7 vertebral body is difficult to visualize due to overlying shoulders. No visible hardware complicating feature. IMPRESSION: ACDF C6-7. No visible complicating feature. C7 not well visualized due to overlying shoulders. Electronically Signed   By: Rolm Baptise M.D.   On: 03/06/2017 10:51    Disposition: 01-Home or Self Care   POD #1 s/p C6/7 ACDF, doing well  - encourage ambulation - Percocet for pain, Valium for muscle spasms -Written scripts for pain signed and in chart -D/C instructions sheet printed and in chart -D/C today  -F/U in office 2 weeks   Signed: Justice Britain 03/16/2017, 12:48 PM

## 2017-03-21 DIAGNOSIS — C44622 Squamous cell carcinoma of skin of right upper limb, including shoulder: Secondary | ICD-10-CM | POA: Diagnosis not present

## 2017-03-22 ENCOUNTER — Telehealth: Payer: Self-pay | Admitting: Internal Medicine

## 2017-03-25 ENCOUNTER — Ambulatory Visit (INDEPENDENT_AMBULATORY_CARE_PROVIDER_SITE_OTHER): Payer: 59 | Admitting: *Deleted

## 2017-03-25 ENCOUNTER — Encounter (HOSPITAL_COMMUNITY): Payer: Self-pay | Admitting: Orthopedic Surgery

## 2017-03-25 DIAGNOSIS — I428 Other cardiomyopathies: Secondary | ICD-10-CM | POA: Diagnosis not present

## 2017-03-25 NOTE — Telephone Encounter (Signed)
Pt needs clearance to proceed w/ cervical bone growth stimulator.  Will forward to device clinic to address/arrange appt to test, being that it is dealing w/ electromagnetic field and ICD. Device clinic will need to arrange appt w/ patient, [name] w/ [employer] and [employer] rep.

## 2017-03-25 NOTE — Telephone Encounter (Signed)
Justin Ray returning my call. Alternating current necklace device to be work 4 hours/day. Low frequency. She is agreeable to testing the device in clinic 04/10/17 at noon.   Joey Deakins Corporate investment banker) made aware of time/location.  [name] aware and agreeable.

## 2017-03-25 NOTE — Progress Notes (Signed)
Remote ICD transmission.   

## 2017-03-25 NOTE — Telephone Encounter (Signed)
Justin Ray called to check on the status of his request. I am communicating with [name]) about interference with ICD and I have left a message with [name] at [employer] with a few questions about the bone growth stimulator.

## 2017-03-27 ENCOUNTER — Encounter: Payer: Self-pay | Admitting: Cardiology

## 2017-04-08 ENCOUNTER — Telehealth: Payer: Self-pay | Admitting: Internal Medicine

## 2017-04-08 NOTE — Telephone Encounter (Signed)
New Message:    Justin Ray is calling about a surgery the pt had at a orthapedic office. Pt is scheduled for an appt on 3/27 and representative will be here with him then to make sure the device he has to wear after this surgery will work well with his current device from dr. Lovena Le.

## 2017-04-09 NOTE — Telephone Encounter (Signed)
Per operator no action needed.

## 2017-04-10 ENCOUNTER — Ambulatory Visit (INDEPENDENT_AMBULATORY_CARE_PROVIDER_SITE_OTHER): Payer: Worker's Compensation | Admitting: *Deleted

## 2017-04-10 DIAGNOSIS — Z95810 Presence of automatic (implantable) cardiac defibrillator: Secondary | ICD-10-CM

## 2017-04-10 DIAGNOSIS — I428 Other cardiomyopathies: Secondary | ICD-10-CM

## 2017-04-10 LAB — CUP PACEART INCLINIC DEVICE CHECK
Brady Statistic RA Percent Paced: 0 %
Implantable Lead Location: 753860
Implantable Lead Model: 185
Implantable Lead Serial Number: 124785
Implantable Pulse Generator Implant Date: 20141107
Lead Channel Setting Pacing Amplitude: 2.6 V
Lead Channel Setting Pacing Pulse Width: 0.8 ms
Lead Channel Setting Sensing Sensitivity: 0.6 mV
MDC IDC LEAD IMPLANT DT: 20060117
MDC IDC SESS DTM: 20190327135229
Pulse Gen Serial Number: 126350

## 2017-04-10 NOTE — Progress Notes (Signed)
ICD interrogation in clinic to assess for interference with cervical bone growth stimulator. Representatives from [employer] and [employer] present. No interference noted in multiple positions. Patient will proceed with use of stimulator.  No lead testing performed today. 3 "NSVT" episodes noted on 03/21/17, patient reports these correlate with a R arm dermatologic procedure that utilized monopolar cautery. Remaining longevity: 8 years. Latitude on 06/24/17 and ROV with GT on 06/27/17.

## 2017-04-13 LAB — CUP PACEART REMOTE DEVICE CHECK
Battery Remaining Longevity: 96 mo
Date Time Interrogation Session: 20190311090800
HighPow Impedance: 64 Ohm
Implantable Lead Implant Date: 20060117
Implantable Lead Location: 753860
Implantable Lead Model: 185
Implantable Lead Serial Number: 124785
Implantable Pulse Generator Implant Date: 20141107
Lead Channel Pacing Threshold Pulse Width: 1 ms
MDC IDC MSMT BATTERY REMAINING PERCENTAGE: 100 %
MDC IDC MSMT LEADCHNL RV IMPEDANCE VALUE: 487 Ohm
MDC IDC MSMT LEADCHNL RV PACING THRESHOLD AMPLITUDE: 1.3 V
MDC IDC PG SERIAL: 126350
MDC IDC SET LEADCHNL RV PACING AMPLITUDE: 2.6 V
MDC IDC SET LEADCHNL RV PACING PULSEWIDTH: 0.8 ms
MDC IDC SET LEADCHNL RV SENSING SENSITIVITY: 0.6 mV
MDC IDC STAT BRADY RV PERCENT PACED: 0 %

## 2017-05-29 DIAGNOSIS — Z1389 Encounter for screening for other disorder: Secondary | ICD-10-CM | POA: Diagnosis not present

## 2017-05-29 DIAGNOSIS — J449 Chronic obstructive pulmonary disease, unspecified: Secondary | ICD-10-CM | POA: Diagnosis not present

## 2017-05-29 DIAGNOSIS — Z0000 Encounter for general adult medical examination without abnormal findings: Secondary | ICD-10-CM | POA: Diagnosis not present

## 2017-05-29 DIAGNOSIS — I10 Essential (primary) hypertension: Secondary | ICD-10-CM | POA: Diagnosis not present

## 2017-05-29 DIAGNOSIS — E119 Type 2 diabetes mellitus without complications: Secondary | ICD-10-CM | POA: Diagnosis not present

## 2017-05-29 DIAGNOSIS — Z7689 Persons encountering health services in other specified circumstances: Secondary | ICD-10-CM | POA: Diagnosis not present

## 2017-06-24 ENCOUNTER — Ambulatory Visit (INDEPENDENT_AMBULATORY_CARE_PROVIDER_SITE_OTHER): Payer: 59 | Admitting: *Deleted

## 2017-06-24 DIAGNOSIS — I428 Other cardiomyopathies: Secondary | ICD-10-CM | POA: Diagnosis not present

## 2017-06-25 ENCOUNTER — Encounter: Payer: Self-pay | Admitting: Cardiology

## 2017-06-25 NOTE — Progress Notes (Signed)
Remote ICD transmission.   

## 2017-06-27 ENCOUNTER — Encounter: Payer: Self-pay | Admitting: Internal Medicine

## 2017-06-28 ENCOUNTER — Other Ambulatory Visit: Payer: Self-pay | Admitting: Cardiovascular Disease

## 2017-06-28 DIAGNOSIS — J209 Acute bronchitis, unspecified: Secondary | ICD-10-CM | POA: Diagnosis not present

## 2017-06-28 DIAGNOSIS — R062 Wheezing: Secondary | ICD-10-CM | POA: Diagnosis not present

## 2017-07-17 ENCOUNTER — Other Ambulatory Visit: Payer: Self-pay | Admitting: Internal Medicine

## 2017-07-19 LAB — CUP PACEART REMOTE DEVICE CHECK
Battery Remaining Percentage: 98 %
Brady Statistic RV Percent Paced: 0 %
HIGH POWER IMPEDANCE MEASURED VALUE: 59 Ohm
Lead Channel Impedance Value: 528 Ohm
Lead Channel Pacing Threshold Pulse Width: 1 ms
Lead Channel Setting Pacing Amplitude: 2.6 V
Lead Channel Setting Sensing Sensitivity: 0.6 mV
MDC IDC LEAD IMPLANT DT: 20060117
MDC IDC LEAD LOCATION: 753860
MDC IDC LEAD SERIAL: 124785
MDC IDC MSMT BATTERY REMAINING LONGEVITY: 96 mo
MDC IDC MSMT LEADCHNL RV PACING THRESHOLD AMPLITUDE: 1.3 V
MDC IDC PG IMPLANT DT: 20141107
MDC IDC SESS DTM: 20190610152100
MDC IDC SET LEADCHNL RV PACING PULSEWIDTH: 0.8 ms
Pulse Gen Serial Number: 126350

## 2017-09-04 DIAGNOSIS — E119 Type 2 diabetes mellitus without complications: Secondary | ICD-10-CM | POA: Diagnosis not present

## 2017-09-04 DIAGNOSIS — I10 Essential (primary) hypertension: Secondary | ICD-10-CM | POA: Diagnosis not present

## 2017-09-04 DIAGNOSIS — E7800 Pure hypercholesterolemia, unspecified: Secondary | ICD-10-CM | POA: Diagnosis not present

## 2017-09-09 ENCOUNTER — Ambulatory Visit: Payer: 59 | Admitting: Internal Medicine

## 2017-09-09 ENCOUNTER — Encounter: Payer: Self-pay | Admitting: Internal Medicine

## 2017-09-09 VITALS — BP 134/84 | HR 88 | Ht 68.0 in | Wt 189.8 lb

## 2017-09-09 DIAGNOSIS — Z95810 Presence of automatic (implantable) cardiac defibrillator: Secondary | ICD-10-CM

## 2017-09-09 DIAGNOSIS — I5022 Chronic systolic (congestive) heart failure: Secondary | ICD-10-CM

## 2017-09-09 DIAGNOSIS — I472 Ventricular tachycardia: Secondary | ICD-10-CM | POA: Diagnosis not present

## 2017-09-09 DIAGNOSIS — I4729 Other ventricular tachycardia: Secondary | ICD-10-CM

## 2017-09-09 NOTE — Progress Notes (Signed)
HPI Justin Ray returns today for followup. He is a very pleasant 52 year old man with chronic systolic heart failure, class II, a nonischemic cardiomyopathy, status post ICD implantation. In the interim, he has done well. He continues to work full-time and denies chest pain or shortness of breath. He has not had palpitations. No syncope. No edema. No ICD shock. Allergies  Allergen Reactions  . Codeine Shortness Of Breath and Other (See Comments)    Severe HA's.  . Niaspan [Niacin Er] Other (See Comments)    Per pt feels like skin is burning( REDNESS), and pins/needles are sticking   . Sulfonamide Derivatives Other (See Comments)    thrush     Current Outpatient Medications  Medication Sig Dispense Refill  . ALPRAZolam (XANAX) 1 MG tablet Take 1 tablet (1 mg total) by mouth 2 (two) times daily as needed for anxiety (for anxiety). 60 tablet 5  . aspirin 81 MG tablet Take 81 mg by mouth daily.     . blood glucose meter kit and supplies KIT Test blood sugar 3 times daily. Dx code: E11.65 1 each 0  . carvedilol (COREG) 25 MG tablet take 1 tablet by mouth twice a day WITH A MEAL 180 tablet 1  . desvenlafaxine (PRISTIQ) 100 MG 24 hr tablet TAKE 1 TABLET BY MOUTH ONCE DAILY. 90 tablet 3  . DIGOX 125 MCG tablet TAKE 1 TABLET BY MOUTH DAILY. 90 tablet 0  . furosemide (LASIX) 40 MG tablet Take 1 tablet (40 mg total) by mouth as needed. 30 tablet 6  . insulin NPH-regular Human (HUMULIN 70/30) (70-30) 100 UNIT/ML injection inject 65 units subcutaneously every morning and inject 50-55 units subcutaneously every evening 40 vial 11  . lisinopril (PRINIVIL,ZESTRIL) 20 MG tablet TAKE 1 TABLET BY MOUTH TWICE A DAY. 180 tablet 0  . metFORMIN (GLUCOPHAGE-XR) 500 MG 24 hr tablet Take 1,000 tablets by mouth 2 (two) times daily.   0  . omeprazole (PRILOSEC OTC) 20 MG tablet Take 20 mg by mouth daily as needed (acid reflux).    . rosuvastatin (CRESTOR) 10 MG tablet Take 1 tablet by mouth daily.  2  .  VASCEPA 1 g CAPS TAKE 2 CAPSULES BY MOUTH TWICE A DAY 120 capsule 6  . VICTOZA 18 MG/3ML SOPN inject 0.6 milligram subcutaneously once daily  0   No current facility-administered medications for this visit.      Past Medical History:  Diagnosis Date  . AICD (automatic cardioverter/defibrillator) present   . Anxiety   . Cancer ([place])    skin cancer  . Cardiomyopathy   . Carpal tunnel syndrome 10/14   right-GSC  . Cataract   . CHF (congestive heart failure) ([place])   . Depression   . Diabetes mellitus   . Dyslipidemia   . Hypertension   . ICD (implantable cardioverter-defibrillator) in place   . Pneumonia   . Sleep apnea    was tested many yr ago-does not use a cpap-  . Wears glasses     ROS:   All systems reviewed and negative except as noted in the HPI.   Past Surgical History:  Procedure Laterality Date  . ANTERIOR CERVICAL DECOMP/DISCECTOMY FUSION N/A 03/06/2017   Procedure: ANTERIOR CERVICAL DECOMPRESSION FUSION,CERVICAL 6-7 WITH INSTRUMENTATION AND ALLOGRAFT REQUESTED TIME 2.5 HRS;  Surgeon: Phylliss Bob, MD;  Location: [facility];  Service: Orthopedics;  Laterality: N/A;  ANTERIOR CERVICAL DECOMPRESSION FUSION, CERVICAL 6-7 WITH INSTRUMENTATION AND ALLOGRAFT REQUESTED TIME 2.5 HRS  . CARDIAC  DEFIBRILLATOR PLACEMENT  02/01/2004  . CARDIAC DEFIBRILLATOR PLACEMENT  11/14   replaced with new one  . CARPAL TUNNEL RELEASE Left 12/19/2012   Procedure: LEFT CARPAL TUNNEL RELEASE;  Surgeon: Roseanne Kaufman, MD;  Location: [facility] [place];  Service: Orthopedics;  Laterality: Left;  . CARPAL TUNNEL RELEASE Right   . EYE SURGERY Right    cataract surgery with lens implants  . IMPLANTABLE CARDIOVERTER DEFIBRILLATOR (ICD) GENERATOR CHANGE N/A 11/21/2012   Procedure: ICD GENERATOR CHANGE;  Surgeon: Evans Lance, MD;  Location: [facility] CATH LAB;  Service: Cardiovascular;  Laterality: N/A;  . TONSILLECTOMY       Family History  Problem Relation Age of Onset  . Coronary artery  disease Father        CABG  . Cancer Mother        colon  . Heart disease Brother   . Cancer Unknown      Social History   Socioeconomic History  . Marital status: Widowed    Spouse name: Not on file  . Number of children: 0  . Years of education: Not on file  . Highest education level: Not on file  Occupational History  . Occupation: maintenance    Employer: UNEMPLOYED  Social Needs  . Financial resource strain: Not on file  . Food insecurity:    Worry: Not on file    Inability: Not on file  . Transportation needs:    Medical: Not on file    Non-medical: Not on file  Tobacco Use  . Smoking status: Current Every Day Smoker    Packs/day: 1.50    Types: Cigarettes  . Smokeless tobacco: Never Used  Substance and Sexual Activity  . Alcohol use: No  . Drug use: No  . Sexual activity: Never    Comment: number of sex partners in the last 12 months  0  Lifestyle  . Physical activity:    Days per week: Not on file    Minutes per session: Not on file  . Stress: Not on file  Relationships  . Social connections:    Talks on phone: Not on file    Gets together: Not on file    Attends religious service: Not on file    Active member of club or organization: Not on file    Attends meetings of clubs or organizations: Not on file    Relationship status: Not on file  . Intimate partner violence:    Fear of current or ex partner: Not on file    Emotionally abused: Not on file    Physically abused: Not on file    Forced sexual activity: Not on file  Other Topics Concern  . Not on file  Social History Narrative  . Not on file     BP 134/84   Pulse 88   Ht '5\' 8"'$  (1.727 m)   Wt 189 lb 12.8 oz (86.1 kg)   SpO2 95%   BMI 28.86 kg/m   Physical Exam:  Well appearing 52 yo man, NAD HEENT: Unremarkable Neck:  6 cm JVD, no thyromegally Lymphatics:  No adenopathy Back:  No CVA tenderness Lungs:  Clear with no wheezes HEART:  Regular rate rhythm, no murmurs, no rubs, no  clicks Abd:  soft, positive bowel sounds, no organomegally, no rebound, no guarding Ext:  2 plus pulses, no edema, no cyanosis, no clubbing Skin:  No rashes no nodules Neuro:  CN II through XII intact, motor grossly intact  EKG -  NSR  DEVICE  Normal device function.  See PaceArt for details.   Assess/Plan: 1. Chronic systolic heart failure - despite severe LV dysfunction, his symptoms are class 1-2 and he will continue his current meds. 2. ICD - his [place] [employer] ICD is working normally. Will recheck in several months. 3. HTN - his blood pressure is reasonably well controlled. No change.   Justin Ray.D.

## 2017-09-09 NOTE — Patient Instructions (Signed)
Medication Instructions:  Your physician recommends that you continue on your current medications as directed. Please refer to the Current Medication list given to you today.  Labwork: None ordered.  Testing/Procedures: None ordered.  Follow-Up: Your physician wants you to follow-up in: one year with Dr. Lovena Le.   You will receive a reminder letter in the mail two months in advance. If you don't receive a letter, please call our office to schedule the follow-up appointment.  Remote monitoring is used to monitor your ICD from home. This monitoring reduces the number of office visits required to check your device to one time per year. It allows [place] to keep an eye on the functioning of your device to ensure it is working properly. You are scheduled for a device check from home on 09/23/2017. You may send your transmission at any time that day. If you have a wireless device, the transmission will be sent automatically. After your physician reviews your transmission, you will receive a postcard with your next transmission date.  Any Other Special Instructions Will Be Listed Below (If Applicable).  If you need a refill on your cardiac medications before your next appointment, please call your pharmacy.

## 2017-09-17 LAB — CUP PACEART INCLINIC DEVICE CHECK
Date Time Interrogation Session: 20190826040000
HIGH POWER IMPEDANCE MEASURED VALUE: 67 Ohm
HighPow Impedance: 50 Ohm
Implantable Lead Location: 753860
Implantable Lead Model: 185
Implantable Pulse Generator Implant Date: 20141107
Lead Channel Impedance Value: 548 Ohm
Lead Channel Pacing Threshold Pulse Width: 0.8 ms
Lead Channel Sensing Intrinsic Amplitude: 14 mV
Lead Channel Setting Pacing Amplitude: 2.8 V
Lead Channel Setting Pacing Pulse Width: 0.8 ms
MDC IDC LEAD IMPLANT DT: 20060117
MDC IDC LEAD SERIAL: 124785
MDC IDC MSMT LEADCHNL RV PACING THRESHOLD AMPLITUDE: 1.4 V
MDC IDC SET LEADCHNL RV SENSING SENSITIVITY: 0.6 mV
Pulse Gen Serial Number: 126350

## 2017-09-23 ENCOUNTER — Telehealth: Payer: Self-pay

## 2017-09-23 ENCOUNTER — Ambulatory Visit (INDEPENDENT_AMBULATORY_CARE_PROVIDER_SITE_OTHER): Payer: 59 | Admitting: *Deleted

## 2017-09-23 DIAGNOSIS — I428 Other cardiomyopathies: Secondary | ICD-10-CM

## 2017-09-23 NOTE — Telephone Encounter (Signed)
Spoke with pt and reminded pt of remote transmission that is due today. Pt verbalized understanding.   

## 2017-09-24 ENCOUNTER — Encounter: Payer: Self-pay | Admitting: Cardiology

## 2017-09-24 NOTE — Progress Notes (Signed)
Remote ICD transmission.   

## 2017-10-11 ENCOUNTER — Other Ambulatory Visit: Payer: Self-pay | Admitting: Internal Medicine

## 2017-10-15 DIAGNOSIS — H26491 Other secondary cataract, right eye: Secondary | ICD-10-CM | POA: Diagnosis not present

## 2017-10-15 DIAGNOSIS — Z961 Presence of intraocular lens: Secondary | ICD-10-CM | POA: Diagnosis not present

## 2017-10-15 DIAGNOSIS — E113291 Type 2 diabetes mellitus with mild nonproliferative diabetic retinopathy without macular edema, right eye: Secondary | ICD-10-CM | POA: Diagnosis not present

## 2017-10-15 LAB — CUP PACEART REMOTE DEVICE CHECK
Brady Statistic RV Percent Paced: 0 %
Date Time Interrogation Session: 20190909153300
HIGH POWER IMPEDANCE MEASURED VALUE: 59 Ohm
Implantable Lead Location: 753860
Implantable Pulse Generator Implant Date: 20141107
Lead Channel Impedance Value: 494 Ohm
Lead Channel Pacing Threshold Amplitude: 1.4 V
Lead Channel Setting Pacing Amplitude: 2.8 V
Lead Channel Setting Pacing Pulse Width: 0.8 ms
MDC IDC LEAD IMPLANT DT: 20060117
MDC IDC LEAD SERIAL: 124785
MDC IDC MSMT BATTERY REMAINING LONGEVITY: 90 mo
MDC IDC MSMT BATTERY REMAINING PERCENTAGE: 95 %
MDC IDC MSMT LEADCHNL RV PACING THRESHOLD PULSEWIDTH: 0.8 ms
MDC IDC SET LEADCHNL RV SENSING SENSITIVITY: 0.6 mV
Pulse Gen Serial Number: 126350

## 2017-10-18 ENCOUNTER — Other Ambulatory Visit: Payer: Self-pay | Admitting: Internal Medicine

## 2017-12-11 DIAGNOSIS — E119 Type 2 diabetes mellitus without complications: Secondary | ICD-10-CM | POA: Diagnosis not present

## 2017-12-11 DIAGNOSIS — I10 Essential (primary) hypertension: Secondary | ICD-10-CM | POA: Diagnosis not present

## 2017-12-23 ENCOUNTER — Ambulatory Visit (INDEPENDENT_AMBULATORY_CARE_PROVIDER_SITE_OTHER): Payer: 59

## 2017-12-23 DIAGNOSIS — I428 Other cardiomyopathies: Secondary | ICD-10-CM | POA: Diagnosis not present

## 2017-12-24 NOTE — Progress Notes (Signed)
Remote ICD transmission.   

## 2018-01-14 DIAGNOSIS — L570 Actinic keratosis: Secondary | ICD-10-CM | POA: Diagnosis not present

## 2018-01-20 ENCOUNTER — Other Ambulatory Visit: Payer: Self-pay | Admitting: Cardiovascular Disease

## 2018-01-20 NOTE — Telephone Encounter (Signed)
Rx request sent to pharmacy.  

## 2018-02-05 LAB — CUP PACEART REMOTE DEVICE CHECK
Brady Statistic RV Percent Paced: 0 %
Date Time Interrogation Session: 20191209123100
HighPow Impedance: 63 Ohm
Implantable Lead Implant Date: 20060117
Implantable Lead Model: 185
Lead Channel Impedance Value: 508 Ohm
Lead Channel Pacing Threshold Amplitude: 1.4 V
Lead Channel Setting Pacing Pulse Width: 0.8 ms
MDC IDC LEAD LOCATION: 753860
MDC IDC LEAD SERIAL: 124785
MDC IDC MSMT BATTERY REMAINING LONGEVITY: 90 mo
MDC IDC MSMT BATTERY REMAINING PERCENTAGE: 92 %
MDC IDC MSMT LEADCHNL RV PACING THRESHOLD PULSEWIDTH: 0.8 ms
MDC IDC PG IMPLANT DT: 20141107
MDC IDC SET LEADCHNL RV PACING AMPLITUDE: 2.8 V
MDC IDC SET LEADCHNL RV SENSING SENSITIVITY: 0.6 mV
Pulse Gen Serial Number: 126350

## 2018-02-18 ENCOUNTER — Other Ambulatory Visit: Payer: Self-pay | Admitting: Cardiovascular Disease

## 2018-02-19 ENCOUNTER — Ambulatory Visit: Payer: 59 | Admitting: Physician Assistant

## 2018-02-19 ENCOUNTER — Telehealth: Payer: Self-pay | Admitting: Physician Assistant

## 2018-02-19 ENCOUNTER — Encounter: Payer: Self-pay | Admitting: Physician Assistant

## 2018-02-19 VITALS — BP 126/78 | HR 92 | Ht 68.0 in | Wt 187.0 lb

## 2018-02-19 DIAGNOSIS — E785 Hyperlipidemia, unspecified: Secondary | ICD-10-CM

## 2018-02-19 DIAGNOSIS — R002 Palpitations: Secondary | ICD-10-CM

## 2018-02-19 DIAGNOSIS — I10 Essential (primary) hypertension: Secondary | ICD-10-CM | POA: Diagnosis not present

## 2018-02-19 DIAGNOSIS — G4733 Obstructive sleep apnea (adult) (pediatric): Secondary | ICD-10-CM

## 2018-02-19 DIAGNOSIS — I428 Other cardiomyopathies: Secondary | ICD-10-CM

## 2018-02-19 NOTE — Progress Notes (Signed)
Cardiology Office Note    Date:  02/19/2018   ID:  Justin Ray, DOB Dec 20, 1965, MRN [id number]  PCP:  Townsend Roger, MD  Cardiologist: Dr. Claiborne Billings Electrophysiologist: Dr. Lovena Le  Chief Complaint  Patient presents with  . Follow-up    12 months.  . Dizziness  . Irregular Heart Beat    About 2 weeks ago for about an hour or so.    History of Present Illness:  Justin Ray is a 53 y.o. male with PMH of anxiety, DM 2, HTN, HLD, obstructive sleep apnea and a history of NICM s/p ICD.  He underwent initial ICD implantation in January 2006 and had a generator change out in November 2014 by Dr. Lovena Le.  He was previously followed by a cardiology group in [place].  His ICD is being managed by Dr. Lovena Le.  Echocardiogram performed in August 2015 in [place] showed EF of 45 to 50%.  Last echocardiogram in October 2018 showed a significant improvement in the LV function with EF of 50 to 55%.  His last office visit with Dr. Claiborne Billings was on 01/29/2017, he had a sleep study the year before which he did not meet the criteria for CPAP therapy.  He did have reduced oxygen saturation with nadir of 85%.  Patient presents today for annual visit.  For the past few weeks, he has been having some shortness of breath walking up an incline.  He denies any shortness of breath walking on flat ground.  I plan to obtain an echocardiogram.  He appears to be euvolemic on physical exam.  Unfortunately he continues to smoke.  We discussed the importance of tobacco cessation.  He has tried Chantix in the past which gave him bad dreams.  The other issue he has been having was palpitation.  Previous device interrogation in December showed 2 episodes of nonsustained VT, the longest episode was 7 seconds.  He says last week, he had irregular rhythm and palpitation that lasted for more than 2 days.  He only had occasional dizziness but no persistent dizziness during the episode.  This makes me suspect possibility of atrial  fibrillation more likely than VT spell.  I will arrange for him to have a fairly soon device interrogation in the device clinic neurosurgery's office.   Past Medical History:  Diagnosis Date  . AICD (automatic cardioverter/defibrillator) present   . Anxiety   . Cancer ([place])    skin cancer  . Cardiomyopathy   . Carpal tunnel syndrome 10/14   right-GSC  . Cataract   . CHF (congestive heart failure) ([place])   . Depression   . Diabetes mellitus   . Dyslipidemia   . Hypertension   . ICD (implantable cardioverter-defibrillator) in place   . Pneumonia   . Sleep apnea    was tested many yr ago-does not use a cpap-  . Wears glasses     Past Surgical History:  Procedure Laterality Date  . ANTERIOR CERVICAL DECOMP/DISCECTOMY FUSION N/A 03/06/2017   Procedure: ANTERIOR CERVICAL DECOMPRESSION FUSION,CERVICAL 6-7 WITH INSTRUMENTATION AND ALLOGRAFT REQUESTED TIME 2.5 HRS;  Surgeon: Phylliss Bob, MD;  Location: [facility];  Service: Orthopedics;  Laterality: N/A;  ANTERIOR CERVICAL DECOMPRESSION FUSION, CERVICAL 6-7 WITH INSTRUMENTATION AND ALLOGRAFT REQUESTED TIME 2.5 HRS  . CARDIAC DEFIBRILLATOR PLACEMENT  02/01/2004  . CARDIAC DEFIBRILLATOR PLACEMENT  11/14   replaced with new one  . CARPAL TUNNEL RELEASE Left 12/19/2012   Procedure: LEFT CARPAL TUNNEL RELEASE;  Surgeon: Roseanne Kaufman, MD;  Location: [facility];  Service: Orthopedics;  Laterality: Left;  . CARPAL TUNNEL RELEASE Right   . EYE SURGERY Right    cataract surgery with lens implants  . IMPLANTABLE CARDIOVERTER DEFIBRILLATOR (ICD) GENERATOR CHANGE N/A 11/21/2012   Procedure: ICD GENERATOR CHANGE;  Surgeon: Evans Lance, MD;  Location[facility] C[facility];  Service: Cardiovascular;  Laterality: N/A;  . TONSILLECTOMY      Current Medications: Outpatient Medications Prior to Visit  Medication Sig Dispense Refill  . ALPRAZolam (XANAX) 1 MG tablet Take 1 tablet (1 mg total) by mouth 2 (two) times daily as needed for anxiety (for  anxiety). 60 tablet 5  . aspirin 81 MG tablet Take 81 mg by mouth daily.     . blood glucose meter kit and supplies KIT Test blood sugar 3 times daily. Dx code: E11.65 1 each 0  . carvedilol (COREG) 25 MG tablet take 1 tablet by mouth twice a day WITH A MEAL 180 tablet 1  . desvenlafaxine (PRISTIQ) 100 MG 24 hr tablet TAKE 1 TABLET BY MOUTH ONCE DAILY. 90 tablet 3  . DIGOX 125 MCG tablet TAKE 1 TABLET BY MOUTH DAILY. 90 tablet 3  . furosemide (LASIX) 40 MG tablet Take 1 tablet (40 mg total) by mouth as needed. 30 tablet 6  . insulin NPH-regular Human (HUMULIN 70/30) (70-30) 100 UNIT/ML injection inject 65 units subcutaneously every morning and inject 50-55 units subcutaneously every evening 40 vial 11  . lisinopril (PRINIVIL,ZESTRIL) 20 MG tablet TAKE 1 TABLET BY MOUTH TWICE A DAY. 180 tablet 3  . metFORMIN (GLUCOPHAGE-XR) 500 MG 24 hr tablet Take 1,000 mg by mouth 2 (two) times daily.   0  . omeprazole (PRILOSEC OTC) 20 MG tablet Take 20 mg by mouth daily as needed (acid reflux).    . rosuvastatin (CRESTOR) 10 MG tablet Take 1 tablet by mouth daily.  2  . VASCEPA 1 g CAPS TAKE 2 CAPSULES(2 GRAMS) BY MOUTH TWICE DAILY 120 capsule 1  . VICTOZA 18 MG/3ML SOPN inject 0.6 milligram subcutaneously once daily  0   No facility-administered medications prior to visit.      Allergies:   Codeine; Niaspan [niacin er]; and Sulfonamide derivatives   Social History   Socioeconomic History  . Marital status: Widowed    Spouse name: Not on file  . Number of children: 0  . Years of education: Not on file  . Highest education level: Not on file  Occupational History  . Occupation: maintenance    Employer: UNEMPLOYED  Social Needs  . Financial resource strain: Not on file  . Food insecurity:    Worry: Not on file    Inability: Not on file  . Transportation needs:    Medical: Not on file    Non-medical: Not on file  Tobacco Use  . Smoking status: Current Every Day Smoker    Packs/day: 1.50     Types: Cigarettes  . Smokeless tobacco: Never Used  Substance and Sexual Activity  . Alcohol use: No  . Drug use: No  . Sexual activity: Never    Comment: number of sex partners in the last 12 months  0  Lifestyle  . Physical activity:    Days per week: Not on file    Minutes per session: Not on file  . Stress: Not on file  Relationships  . Social connections:    Talks on phone: Not on file    Gets together: Not on file    Attends religious service:  Not on file    Active member of club or organization: Not on file    Attends meetings of clubs or organizations: Not on file    Relationship status: Not on file  Other Topics Concern  . Not on file  Social History Narrative  . Not on file     Family History:  The patient's family history includes Cancer in his mother and another family member; Coronary artery disease in his father; Heart disease in his brother.   ROS:   Please see the history of present illness.    ROS All other systems reviewed and are negative.   PHYSICAL EXAM:   VS:  BP 126/78 (BP Location: Left Arm, Patient Position: Sitting, Cuff Size: Normal)   Pulse 92   Ht '5\' 8"'$  (1.727 m)   Wt 187 lb (84.8 kg)   BMI 28.43 kg/m    GEN: Well nourished, well developed, in no acute distress  HEENT: normal  Neck: no JVD, carotid bruits, or masses Cardiac: RRR; no murmurs, rubs, or gallops,no edema  Respiratory:  clear to auscultation bilaterally, normal work of breathing GI: soft, nontender, nondistended, + BS MS: no deformity or atrophy  Skin: warm and dry, no rash Neuro:  Alert and Oriented x 3, Strength and sensation are intact Psych: euthymic mood, full affect  Wt Readings from Last 3 Encounters:  02/19/18 187 lb (84.8 kg)  09/09/17 189 lb 12.8 oz (86.1 kg)  03/06/17 186 lb (84.4 kg)      Studies/Labs Reviewed:   EKG:  EKG is ordered today.  The ekg ordered today demonstrates normal sinus rhythm, no significant ST-T wave changes  Recent  Labs: 03/05/2017: ALT 43; BUN 14; Creatinine, Ser 0.86; Hemoglobin 17.5; Platelets 151; Potassium 4.6; Sodium 140   Lipid Panel    Component Value Date/Time   CHOL 83 (L) 05/06/2015 1723   TRIG 181 (H) 05/06/2015 1723   HDL 32 (L) 05/06/2015 1723   CHOLHDL 2.6 05/06/2015 1723   CHOLHDL 2.7 02/11/2015 0958   VLDL 45 (H) 02/11/2015 0958   LDLCALC 15 05/06/2015 1723    Additional studies/ records that were reviewed today include:   Echo 10/15/2016 LV EF: 50% -   55% Study Conclusions  - Left ventricle: The cavity size was normal. Wall thickness was   increased in a pattern of mild LVH. Systolic function was normal.   The estimated ejection fraction was in the range of 50% to 55%. - Left atrium: The atrium was mildly dilated.    ASSESSMENT:    1. Palpitations   2. Essential hypertension   3. Hyperlipidemia, unspecified hyperlipidemia type   4. NICM (nonischemic cardiomyopathy) ([place])   5. OSA (obstructive sleep apnea)      PLAN:  In order of problems listed above:  1. Palpitation:   -Patient had 2 episodes of nonsustained VT, the longest episode was 7 seconds on the last interrogation in December.  A week ago, he had palpitation that lasted for more than 2 days.  He had occasional dizziness associated with the symptom, however no persistent dizziness.  Therefore, it is more likely he had atrial fibrillation instead of sustained VT.  He will need a device interrogation again.  If he does indeed have atrial fibrillation, then he will need to be placed on systemic anticoagulation therapy.  I am unable to uptitrate carvedilol any further since he is already on maximum dose.  2. NICM s/p medtronic ICD: He has been having some shortness of breath  in the past few weeks.  I will repeat echocardiogram.  Last echocardiogram in 2018 showed that his EF has normalized.  3. Hypertension: Blood pressure stable on current therapy  4. Hyperlipidemia: On Crestor and Vascepa.  He is due to  have a repeat lipid panel.  Last lipid panel showed uncontrolled triglyceride.  5. OSA: He did not meet the criteria for CPAP therapy.   Medication Adjustments/Labs and Tests Ordered: Current medicines are reviewed at length with the patient today.  Concerns regarding medicines are outlined above.  Medication changes, Labs and Tests ordered today are listed in the Patient Instructions below. Patient Instructions  Medication Instructions:  Your physician recommends that you continue on your current medications as directed. Please refer to the Current Medication list given to you today. If you need a refill on your cardiac medications before your next appointment, please call your pharmacy.   Lab work: NONE  If you have labs (blood work) drawn today and your tests are completely normal, you will receive your results only by: Marland Kitchen MyChart Message (if you have MyChart) OR . A paper copy in the mail If you have any lab test that is abnormal or we need to change your treatment, we will call you to review the results.  Testing/Procedures: Your physician has requested that you have an echocardiogram. Echocardiography is a painless test that uses sound waves to create images of your heart. It provides your doctor with information about the size and shape of your heart and how well your heart's chambers and valves are working. This procedure takes approximately one hour. There are no restrictions for this procedure. [place]  Follow-Up: At [facility], you and your health needs are our priority.  As part of our continuing mission to provide you with exceptional heart care, we have created designated Provider Care Teams.  These Care Teams include your primary Cardiologist (physician) and Advanced Practice Providers (APPs -  Physician Assistants and Nurse Practitioners) who all work together to provide you with the care you need, when you need it. You will need a follow up  appointment in 6 months.  Please call our office 2 months in advance to schedule this appointment.  You may see [name], MD or one of the following Advanced Practice Providers on your designated Care Team: [place], [place] . Fabian Sharp, PA-C  PATIENT NEEDS APPT WITH DEVICE CLINIC FOR NEXT WEEK PER [place]   Any Other Special Instructions Will Be Listed Below (If Applicable).      Hilbert Corrigan, [place]  02/19/2018 10:31 AM    [address], [place], [place]  [address] Phone: [phone]; Fax: [phone]

## 2018-02-19 NOTE — Patient Instructions (Addendum)
Medication Instructions:  Your physician recommends that you continue on your current medications as directed. Please refer to the Current Medication list given to you today. If you need a refill on your cardiac medications before your next appointment, please call your pharmacy.   Lab work: NONE  If you have labs (blood work) drawn today and your tests are completely normal, you will receive your results only by: Marland Kitchen MyChart Message (if you have MyChart) OR . A paper copy in the mail If you have any lab test that is abnormal or we need to change your treatment, we will call you to review the results.  Testing/Procedures: Your physician has requested that you have an echocardiogram. Echocardiography is a painless test that uses sound waves to create images of your heart. It provides your doctor with information about the size and shape of your heart and how well your heart's chambers and valves are working. This procedure takes approximately one hour. There are no restrictions for this procedure. Justin Ray  Follow-Up: At [facility], you and your health needs are our priority.  As part of our continuing mission to provide you with exceptional heart care, we have created designated Provider Care Teams.  These Care Teams include your primary Cardiologist (physician) and Advanced Practice Providers (APPs -  Physician Assistants and Nurse Practitioners) who all work together to provide you with the care you need, when you need it. You will need a follow up appointment in 6 months.  Please call our office 2 months in advance to schedule this appointment.  You may see [name], MD or one of the following Advanced Practice Providers on your designated Care Team: [place], [place] . Fabian Sharp, PA-C  PATIENT NEEDS APPT WITH DEVICE CLINIC FOR NEXT WEEK PER [facility]   Any Other Special Instructions Will Be Listed Below (If Applicable).

## 2018-02-19 NOTE — Telephone Encounter (Signed)
Discussed with device clinic, patient only has a single lead ICD, no atrial lead, unable to detect afib. Remote transmission from last night has been reviewed by our device clinic and found no recent prolonged ventricular rhythm. I have called the patient and will plan for 30 day event monitor for look for afib.  Hilbert Corrigan PA Pager: [phone]

## 2018-02-19 NOTE — Addendum Note (Signed)
Addended by: Ulice Brilliant T on: 02/19/2018 03:43 PM   Modules accepted: Orders

## 2018-02-24 ENCOUNTER — Encounter: Payer: Self-pay | Admitting: *Deleted

## 2018-03-03 ENCOUNTER — Ambulatory Visit (HOSPITAL_COMMUNITY): Payer: 59 | Attending: Cardiology

## 2018-03-03 ENCOUNTER — Ambulatory Visit (INDEPENDENT_AMBULATORY_CARE_PROVIDER_SITE_OTHER): Payer: 59

## 2018-03-03 DIAGNOSIS — R002 Palpitations: Secondary | ICD-10-CM | POA: Diagnosis not present

## 2018-03-03 DIAGNOSIS — I5022 Chronic systolic (congestive) heart failure: Secondary | ICD-10-CM

## 2018-03-03 DIAGNOSIS — I428 Other cardiomyopathies: Secondary | ICD-10-CM

## 2018-03-05 ENCOUNTER — Telehealth: Payer: Self-pay

## 2018-03-05 NOTE — Progress Notes (Signed)
Left voice message for the patient to call back for ECHO results. 

## 2018-03-05 NOTE — Telephone Encounter (Signed)
Left voice message for the patient to call back for ECHO results. 

## 2018-03-06 ENCOUNTER — Telehealth: Payer: Self-pay

## 2018-03-06 NOTE — Progress Notes (Signed)
Left voice message for the patient to call back for ECHO results. 

## 2018-03-06 NOTE — Telephone Encounter (Signed)
Called patient on both home telephone and cell phone. LVM on home number for patient to call back for ECHO results.

## 2018-03-07 NOTE — Telephone Encounter (Signed)
Follow up  ° °Patient returning call in reference to echo results.  °

## 2018-03-07 NOTE — Telephone Encounter (Signed)
Called, notified patient of ECHO results. Will notify assistant that patient had no questions.  Thanks!

## 2018-03-09 LAB — CUP PACEART REMOTE DEVICE CHECK
Battery Remaining Longevity: 90 mo
Battery Remaining Percentage: 93 %
Date Time Interrogation Session: 20200217130800
HIGH POWER IMPEDANCE MEASURED VALUE: 66 Ohm
Implantable Lead Location: 753860
Implantable Pulse Generator Implant Date: 20141107
Lead Channel Setting Pacing Amplitude: 2.8 V
Lead Channel Setting Pacing Pulse Width: 0.8 ms
Lead Channel Setting Sensing Sensitivity: 0.6 mV
MDC IDC LEAD IMPLANT DT: 20060117
MDC IDC LEAD SERIAL: 124785
MDC IDC MSMT LEADCHNL RV IMPEDANCE VALUE: 554 Ohm
MDC IDC MSMT LEADCHNL RV PACING THRESHOLD AMPLITUDE: 1.4 V
MDC IDC MSMT LEADCHNL RV PACING THRESHOLD PULSEWIDTH: 0.8 ms
MDC IDC STAT BRADY RV PERCENT PACED: 0 %
Pulse Gen Serial Number: 126350

## 2018-03-10 NOTE — Progress Notes (Signed)
Patient was given his results by Caprice Beaver, LPN who documented that she gave to the patient. Patient did not have any questions

## 2018-03-12 NOTE — Addendum Note (Signed)
Addended by: Douglass Rivers D on: 03/12/2018 08:58 AM   Modules accepted: Level of Service

## 2018-03-12 NOTE — Progress Notes (Signed)
Remote ICD transmission.   

## 2018-03-14 DIAGNOSIS — I5022 Chronic systolic (congestive) heart failure: Secondary | ICD-10-CM | POA: Diagnosis not present

## 2018-03-14 DIAGNOSIS — E1165 Type 2 diabetes mellitus with hyperglycemia: Secondary | ICD-10-CM | POA: Diagnosis not present

## 2018-03-14 DIAGNOSIS — I10 Essential (primary) hypertension: Secondary | ICD-10-CM | POA: Diagnosis not present

## 2018-03-24 ENCOUNTER — Ambulatory Visit (INDEPENDENT_AMBULATORY_CARE_PROVIDER_SITE_OTHER): Payer: 59 | Admitting: *Deleted

## 2018-03-24 DIAGNOSIS — I428 Other cardiomyopathies: Secondary | ICD-10-CM | POA: Diagnosis not present

## 2018-03-24 DIAGNOSIS — I5022 Chronic systolic (congestive) heart failure: Secondary | ICD-10-CM

## 2018-03-25 LAB — CUP PACEART REMOTE DEVICE CHECK
Battery Remaining Longevity: 90 mo
Battery Remaining Percentage: 91 %
HighPow Impedance: 67 Ohm
Implantable Lead Implant Date: 20060117
Implantable Lead Location: 753860
Implantable Lead Model: 185
Implantable Lead Serial Number: 124785
Lead Channel Pacing Threshold Pulse Width: 0.8 ms
Lead Channel Setting Sensing Sensitivity: 0.6 mV
MDC IDC MSMT LEADCHNL RV IMPEDANCE VALUE: 544 Ohm
MDC IDC MSMT LEADCHNL RV PACING THRESHOLD AMPLITUDE: 1.4 V
MDC IDC PG IMPLANT DT: 20141107
MDC IDC PG SERIAL: 126350
MDC IDC SESS DTM: 20200309094700
MDC IDC SET LEADCHNL RV PACING AMPLITUDE: 2.8 V
MDC IDC SET LEADCHNL RV PACING PULSEWIDTH: 0.8 ms
MDC IDC STAT BRADY RV PERCENT PACED: 0 %

## 2018-03-26 ENCOUNTER — Ambulatory Visit (INDEPENDENT_AMBULATORY_CARE_PROVIDER_SITE_OTHER): Payer: 59

## 2018-03-26 ENCOUNTER — Other Ambulatory Visit: Payer: Self-pay

## 2018-03-26 DIAGNOSIS — E785 Hyperlipidemia, unspecified: Secondary | ICD-10-CM | POA: Diagnosis not present

## 2018-03-26 DIAGNOSIS — R002 Palpitations: Secondary | ICD-10-CM

## 2018-03-26 DIAGNOSIS — I428 Other cardiomyopathies: Secondary | ICD-10-CM

## 2018-03-26 DIAGNOSIS — G4733 Obstructive sleep apnea (adult) (pediatric): Secondary | ICD-10-CM

## 2018-03-26 DIAGNOSIS — I10 Essential (primary) hypertension: Secondary | ICD-10-CM | POA: Diagnosis not present

## 2018-03-31 NOTE — Progress Notes (Signed)
Remote ICD transmission.   

## 2018-04-17 ENCOUNTER — Other Ambulatory Visit: Payer: Self-pay | Admitting: Cardiovascular Disease

## 2018-04-17 NOTE — Telephone Encounter (Signed)
Refilled Vascepa.

## 2018-05-14 ENCOUNTER — Telehealth: Payer: Self-pay

## 2018-05-14 ENCOUNTER — Telehealth: Payer: Self-pay | Admitting: Internal Medicine

## 2018-05-14 NOTE — Telephone Encounter (Signed)
New Message   Patient returning your call for holter monitor results.

## 2018-05-14 NOTE — Telephone Encounter (Addendum)
Left a message for the patient to call back for results of his monitor    ----- Message from [place], [place] sent at 05/08/2018 11:31 AM EDT ----- No evidence of atrial fibrillation or significant irregular rhythm

## 2018-05-15 NOTE — Telephone Encounter (Signed)
The patient has been notified of the result and verbalized understanding. All questions (if any) were answered. Jacqulynn Cadet, CMA 05/15/2018 1:33 PM

## 2018-05-15 NOTE — Progress Notes (Signed)
The patient has been notified of the result and verbalized understanding.  All questions (if any) were answered. Jacqulynn Cadet, CMA 05/15/2018 1:33 PM

## 2018-05-15 NOTE — Telephone Encounter (Signed)
Follow up   Pt is calling back for results   Please call mobile number

## 2018-06-16 ENCOUNTER — Other Ambulatory Visit: Payer: Self-pay | Admitting: Orthopedic Surgery

## 2018-06-23 ENCOUNTER — Telehealth: Payer: Self-pay | Admitting: Student

## 2018-06-23 ENCOUNTER — Encounter: Payer: 59 | Admitting: *Deleted

## 2018-06-23 LAB — CUP PACEART REMOTE DEVICE CHECK
Battery Remaining Longevity: 84 mo
Battery Remaining Percentage: 88 %
Brady Statistic RV Percent Paced: 0 %
Date Time Interrogation Session: 20200608133600
HighPow Impedance: 65 Ohm
Implantable Lead Implant Date: 20060117
Implantable Lead Location: 753860
Implantable Lead Model: 185
Implantable Lead Serial Number: 124785
Implantable Pulse Generator Implant Date: 20141107
Lead Channel Impedance Value: 492 Ohm
Lead Channel Pacing Threshold Amplitude: 1.4 V
Lead Channel Pacing Threshold Pulse Width: 0.8 ms
Lead Channel Setting Pacing Amplitude: 2.8 V
Lead Channel Setting Pacing Pulse Width: 0.8 ms
Lead Channel Setting Sensing Sensitivity: 0.6 mV
Pulse Gen Serial Number: 126350

## 2018-06-23 NOTE — Telephone Encounter (Signed)
Scheduled remote reviewed and showed 10 NST episodes, All on 06/14/18 between 0620-1313. R-R is irregular with rates ~ 160s. 2 EGMs available to be reviewed as below. Unable to r/o afib with single lead device.     This patients CHA2DS2-VASc is at least 3 with CHF, HTN, and DM (+HLD but no directly documented vascular disease).   Will forward to Dr. Lovena Le for review.

## 2018-06-24 ENCOUNTER — Telehealth: Payer: Self-pay | Admitting: Adult Health

## 2018-06-24 NOTE — Telephone Encounter (Signed)
Patient will contact case worker of workers comp,to send over any paperwork, for this virtual visit and will contact our billing dept to confirm/ smartphone/ consent/ my chart/ pre reg completed

## 2018-06-26 ENCOUNTER — Encounter (HOSPITAL_COMMUNITY): Payer: Self-pay

## 2018-06-26 ENCOUNTER — Ambulatory Visit: Admit: 2018-06-26 | Payer: 59 | Admitting: Orthopedic Surgery

## 2018-06-26 SURGERY — ARTHROSCOPY, SHOULDER
Anesthesia: Choice | Laterality: Left

## 2018-06-26 NOTE — Progress Notes (Signed)
LOV MENG HO PA 02-19-18 LOV DR TAYLOR 09-09-17 Epic  LAST DEVICE CHECK 06-23-18 Epic LOV DR Lovena Le 09-09-17 Epic EKG 02-19-18 EPIC

## 2018-06-26 NOTE — Patient Instructions (Addendum)
YOU ARE REQUIRED TO BE TESTED FOR COVID-19 PRIOR TO YOUR SURGERY . YOUR TEST MUST BE COMPLETED ON Monday, June 15TH . TESTING IS LOCATED AT [place] [facility]TR[facility] FROM 9:00AM - 3:00PM. FAILURE TO COMPLETE TESTING MAY RESULT IN CANCELLATION OF YOUR SURGERY.                   Justin Ray    Your procedure is scheduled on: 07-03-2018   Report to [facility] Main  Entrance    Report to admitting at 40 AM       Call this number if you have problems the morning of surgery [phone]    Remember: . BRUSH YOUR TEETH MORNING OF SURGERY AND RINSE YOUR MOUTH OUT, NO CHEWING GUM CANDY OR MINTS.    NO SOLID FOOD AFTER MIDNIGHT THE NIGHT PRIOR TO SURGERY. NOTHING BY MOUTH EXCEPT CLEAR LIQUIDS UNTIL 430 AM. PLEASE FINISH G2  DRINK PER SURGEON ORDER  NEEDS TO BE COMPLETED AT 430 AM.    CLEAR LIQUID DIET   Foods Allowed                                                                     Foods Excluded  Coffee and tea, regular and decaf                             liquids that you cannot  Plain Jell-O in any flavor                                             see through such as: Fruit ices (not with fruit pulp)                                     milk, soups, orange juice  Iced Popsicles                                    All solid food Carbonated beverages, regular and diet                                    Cranberry, grape and apple juices Sports drinks like Gatorade Lightly seasoned clear broth or consume(fat free) Sugar, honey syrup  Sample Menu Breakfast                                Lunch                                     Supper Cranberry juice                    Beef broth  Chicken broth Jell-O                                     Grape juice                           Apple juice Coffee or tea                        Jell-O                                      Popsicle                                                 Coffee or tea                        Coffee or tea  _____________________________________________________________________     Take these medicines the morning of surgery with A SIP OF WATER: ALOPRAZOLAM (XANAX) IF NEEDED, BUPROPION (WELLBUTRIN), PRISTIQ, VASCEPA, ABILIGY, CARVEDILOL (COREG), [place]                   How to Manage Your Diabetes Before and After Surgery  Why is it important to control my blood sugar before and after surgery? . Improving blood sugar levels before and after surgery helps healing and can limit problems. . A way of improving blood sugar control is eating a healthy diet by: o  Eating less sugar and carbohydrates o  Increasing activity/exercise o  Talking with your doctor about reaching your blood sugar goals . High blood sugars (greater than 180 mg/dL) can raise your risk of infections and slow your recovery, so you will need to focus on controlling your diabetes during the weeks before surgery. . Make sure that the doctor who takes care of your diabetes knows about your planned surgery including the date and location.  How do I manage my blood sugar before surgery? . Check your blood sugar at least 4 times a day, starting 2 days before surgery, to make sure that the level is not too high or low. o Check your blood sugar the morning of your surgery when you wake up and every 2 hours until you get to the Short Stay unit. . If your blood sugar is less than 70 mg/dL, you will need to treat for low blood sugar: o Do not take insulin. o Treat a low blood sugar (less than 70 mg/dL) with  cup of clear juice (cranberry or apple), 4 glucose tablets, OR glucose gel. o Recheck blood sugar in 15 minutes after treatment (to make sure it is greater than 70 mg/dL). If your blood sugar is not greater than 70 mg/dL on recheck, call [phone] for further instructions. . Report your blood sugar to the short stay nurse when you get to Short Stay.  . If you are admitted to the  hospital after surgery: o Your blood sugar will be checked by the staff and you will probably be given insulin after surgery (instead of oral diabetes medicines) to make sure you have good blood sugar levels. o The  goal for blood sugar control after surgery is 80-180 mg/dL.   WHAT DO I DO ABOUT MY DIABETES MEDICATION?  Marland Kitchen Do not take oral diabetes medicines (pills) the morning of surgery.  . THE NIGHT BEFORE SURGERY, take     units of       insulin.       . THE MORNING OF SURGERY, take   units of         insulin.  . The day of surgery, do not take other diabetes injectables, including Byetta (exenatide), Bydureon (exenatide ER), Victoza (liraglutide), or Trulicity (dulaglutide).  . If your CBG is greater than 220 mg/dL, you may take  of your sliding scale  . (correction) dose of insulin.    For patients with insulin pumps: Contact your diabetes doctor for specific instructions before surgery. Decrease basal rates by 20% at midnight the night before your surgery. Note that if your surgery is planned to be longer than 2 hours, your insulin pump will be removed and intravenous (IV) insulin will be started and managed by the nurses and the anesthesiologist. You will be able to restart your insulin pump once you are awake and able to manage it.  Make sure to bring insulin pump supplies to the hospital with you in case the  site needs to be changed.  Patient Signature:  Date:   Nurse Signature:  Date:   Reviewed and Endorsed by [facility] Patient Education Committee, August 2015How to Manage Your Diabetes Before and After Surgery  Why is it important to control my blood sugar before and after surgery? . Improving blood sugar levels before and after surgery helps healing and can limit problems. . A way of improving blood sugar control is eating a healthy diet by: o  Eating less sugar and carbohydrates o  Increasing activity/exercise o  Talking with your doctor about reaching your  blood sugar goals . High blood sugars (greater than 180 mg/dL) can raise your risk of infections and slow your recovery, so you will need to focus on controlling your diabetes during the weeks before surgery. . Make sure that the doctor who takes care of your diabetes knows about your planned surgery including the date and location.  How do I manage my blood sugar before surgery? . Check your blood sugar at least 4 times a day, starting 2 days before surgery, to make sure that the level is not too high or low. o Check your blood sugar the morning of your surgery when you wake up and every 2 hours until you get to the Short Stay unit. . If your blood sugar is less than 70 mg/dL, you will need to treat for low blood sugar: o Do not take insulin. o Treat a low blood sugar (less than 70 mg/dL) with  cup of clear juice (cranberry or apple), 4 glucose tablets, OR glucose gel. o Recheck blood sugar in 15 minutes after treatment (to make sure it is greater than 70 mg/dL). If your blood sugar is not greater than 70 mg/dL on recheck, call [phone] for further instructions. . Report your blood sugar to the short stay nurse when you get to Short Stay.  . If you are admitted to the hospital after surgery: o Your blood sugar will be checked by the staff and you will probably be given insulin after surgery (instead of oral diabetes medicines) to make sure you have good blood sugar levels. o The goal for blood sugar control after surgery is  80-180 mg/dL.   WHAT DO I DO ABOUT MY DIABETES MEDICATION?   THE DAY BEFORE SURGERY TAKE FULL DOSE OF HUMULIN 70/30 INSULIN THE EVENING BEFORE SURGERY TAKE 70 % OF HUMULIN 70/30 INSULIN (TAKE 38 UNITS).   THE MORNING OF SURGERY DO NOT TAKE 70/30 INSULIN !  DO NOT TAKE METFORMIN     Reviewed and Endorsed by [facility] Patient Education Committee, August 2015                You may not have any metal on your body including hair pins and              piercings   Do not wear jewelry, make-up, lotions, powders or perfumes, deodorant             Do not wear nail polish.  Do not shave  48 hours prior to surgery.               Do not bring valuables to the hospital. [place].  Contacts, dentures or bridgework may not be worn into surgery.  Leave suitcase in the car. After surgery it may be brought to your room.     Patients discharged the day of surgery will not be allowed to drive home. IF YOU ARE HAVING SURGERY AND GOING HOME THE SAME DAY, YOU MUST HAVE AN ADULT TO DRIVE YOU HOME AND BE WITH YOU FOR 24 HOURS. YOU MAY GO HOME BY TAXI OR UBER OR ORTHERWISE, BUT AN ADULT MUST ACCOMPANY YOU HOME AND STAY WITH YOU FOR 24 HOURS.  Name and phone number of your driver:  Special Instructions: N/A              Please read over the following fact sheets you were given: _____________________________________________________________________             [facility] - Preparing for Surgery Before surgery, you can play an important role.  Because skin is not sterile, your skin needs to be as free of germs as possible.  You can reduce the number of germs on your skin by washing with CHG (chlorahexidine gluconate) soap before surgery.  CHG is an antiseptic cleaner which kills germs and bonds with the skin to continue killing germs even after washing. Please DO NOT use if you have an allergy to CHG or antibacterial soaps.  If your skin becomes reddened/irritated stop using the CHG and inform your nurse when you arrive at Short Stay. Do not shave (including legs and underarms) for at least 48 hours prior to the first CHG shower.  You may shave your face/neck. Please follow these instructions carefully:  1.  Shower with CHG Soap the night before surgery and the  morning of Surgery.  2.  If you choose to wash your hair, wash your hair first as usual with your  normal  shampoo.  3.  After you shampoo, rinse your hair and  body thoroughly to remove the  shampoo.                           4.  Use CHG as you would any other liquid soap.  You can apply chg directly  to the skin and wash                       Gently  with a scrungie or clean washcloth.  5.  Apply the CHG Soap to your body ONLY FROM THE NECK DOWN.   Do not use on face/ open                           Wound or open sores. Avoid contact with eyes, ears mouth and genitals (private parts).                       Wash face,  Genitals (private parts) with your normal soap.             6.  Wash thoroughly, paying special attention to the area where your surgery  will be performed.  7.  Thoroughly rinse your body with warm water from the neck down.  8.  DO NOT shower/wash with your normal soap after using and rinsing off  the CHG Soap.                9.  Pat yourself dry with a clean towel.            10.  Wear clean pajamas.            11.  Place clean sheets on your bed the night of your first shower and do not  sleep with pets. Day of Surgery : Do not apply any lotions/deodorants the morning of surgery.  Please wear clean clothes to the hospital/surgery center.  FAILURE TO FOLLOW THESE INSTRUCTIONS MAY RESULT IN THE CANCELLATION OF YOUR SURGERY PATIENT SIGNATURE_________________________________  NURSE SIGNATURE__________________________________  ________________________________________________________________________   Justin Ray  An incentive spirometer is a tool that can help keep your lungs clear and active. This tool measures how well you are filling your lungs with each breath. Taking long deep breaths may help reverse or decrease the chance of developing breathing (pulmonary) problems (especially infection) following:  A long period of time when you are unable to move or be active. BEFORE THE PROCEDURE   If the spirometer includes an indicator to show your best effort, your nurse or respiratory therapist will set it to a desired  goal.  If possible, sit up straight or lean slightly forward. Try not to slouch.  Hold the incentive spirometer in an upright position. INSTRUCTIONS FOR USE  1. Sit on the edge of your bed if possible, or sit up as far as you can in bed or on a chair. 2. Hold the incentive spirometer in an upright position. 3. Breathe out normally. 4. Place the mouthpiece in your mouth and seal your lips tightly around it. 5. Breathe in slowly and as deeply as possible, raising the piston or the ball toward the top of the column. 6. Hold your breath for 3-5 seconds or for as long as possible. Allow the piston or ball to fall to the bottom of the column. 7. Remove the mouthpiece from your mouth and breathe out normally. 8. Rest for a few seconds and repeat Steps 1 through 7 at least 10 times every 1-2 hours when you are awake. Take your time and take a few normal breaths between deep breaths. 9. The spirometer may include an indicator to show your best effort. Use the indicator as a goal to work toward during each repetition. 10. After each set of 10 deep breaths, practice coughing to be sure your lungs are clear. If you have an incision (the cut made at the time of surgery), support  your incision when coughing by placing a pillow or rolled up towels firmly against it. Once you are able to get out of bed, walk around indoors and cough well. You may stop using the incentive spirometer when instructed by your caregiver.  RISKS AND COMPLICATIONS  Take your time so you do not get dizzy or light-headed.  If you are in pain, you may need to take or ask for pain medication before doing incentive spirometry. It is harder to take a deep breath if you are having pain. AFTER USE  Rest and breathe slowly and easily.  It can be helpful to keep track of a log of your progress. Your caregiver can provide you with a simple table to help with this. If you are using the spirometer at home, follow these instructions: [place] IF:   You are having difficultly using the spirometer.  You have trouble using the spirometer as often as instructed.  Your pain medication is not giving enough relief while using the spirometer.  You develop fever of 100.5 F (38.1 C) or higher. SEEK IMMEDIATE MEDICAL CARE IF:   You cough up bloody sputum that had not been present before.  You develop fever of 102 F (38.9 C) or greater.  You develop worsening pain at or near the incision site. MAKE SURE YOU:   Understand these instructions.  Will watch your condition.  Will get help right away if you are not doing well or get worse. Document Released: 05/14/2006 Document Revised: 03/26/2011 Document Reviewed: 07/15/2006 [facility] Patient Information 2014 [place], [place].   ________________________________________________________________________

## 2018-06-27 ENCOUNTER — Telehealth: Payer: Self-pay | Admitting: *Deleted

## 2018-06-27 ENCOUNTER — Encounter (HOSPITAL_COMMUNITY)
Admission: RE | Admit: 2018-06-27 | Discharge: 2018-06-27 | Disposition: A | Discharge status: Home / Self Care | Payer: No Typology Code available for payment source | Source: Ambulatory Visit | Attending: Orthopedic Surgery | Admitting: Orthopedic Surgery

## 2018-06-27 ENCOUNTER — Other Ambulatory Visit: Payer: Self-pay

## 2018-06-27 ENCOUNTER — Encounter (HOSPITAL_COMMUNITY): Payer: Self-pay

## 2018-06-27 DIAGNOSIS — Z01812 Encounter for preprocedural laboratory examination: Secondary | ICD-10-CM | POA: Diagnosis not present

## 2018-06-27 DIAGNOSIS — M25512 Pain in left shoulder: Secondary | ICD-10-CM | POA: Diagnosis not present

## 2018-06-27 DIAGNOSIS — M25812 Other specified joint disorders, left shoulder: Secondary | ICD-10-CM | POA: Diagnosis not present

## 2018-06-27 LAB — BASIC METABOLIC PANEL
Anion gap: 7 (ref 5–15)
BUN: 17 mg/dL (ref 6–20)
CO2: 24 mmol/L (ref 22–32)
Calcium: 8.9 mg/dL (ref 8.9–10.3)
Chloride: 105 mmol/L (ref 98–111)
Creatinine, Ser: 1.01 mg/dL (ref 0.61–1.24)
GFR calc Af Amer: >60 mL/min (ref >60)
GFR calc non Af Amer: >60 mL/min (ref >60)
Glucose, Bld: 121 mg/dL — ABNORMAL HIGH (ref 70–99)
Potassium: 4.9 mmol/L (ref 3.5–5.1)
Sodium: 136 mmol/L (ref 135–145)

## 2018-06-27 LAB — CBC
HCT: 54.2 % — ABNORMAL HIGH (ref 39.0–52.0)
Hemoglobin: 17.7 g/dL — ABNORMAL HIGH (ref 13.0–17.0)
MCH: 30.6 pg (ref 26.0–34.0)
MCHC: 32.7 g/dL (ref 30.0–36.0)
MCV: 93.6 fL (ref 80.0–100.0)
Platelets: 175 10*3/uL (ref 150–400)
RBC: 5.79 MIL/uL (ref 4.22–5.81)
RDW: 13.4 % (ref 11.5–15.5)
WBC: 7.1 10*3/uL (ref 4.0–10.5)
nRBC: 0 % (ref 0.0–0.2)

## 2018-06-27 LAB — GLUCOSE, CAPILLARY: Glucose-Capillary: 142 mg/dL — ABNORMAL HIGH (ref 70–99)

## 2018-06-27 NOTE — Progress Notes (Addendum)
Pre-op progress note:   ICD in place. Patient reports no shocks. Patient denies any acute cardiac sx today. Vitals WDL. ICD orders have been sent to his EP Dr Cristopher Peru. Not received back yet . RN will f/u on orders and request again if necessary   Copy of [id number] received from PCP office [facility]  On chart 06-18-2018.   Patient reports he has virtual visit with his cardiologist  On 06-30-2018 for surgery clearance .

## 2018-06-27 NOTE — Telephone Encounter (Signed)
Patient appears to already have an appointment scheduled for pre-op clearance 06/30/18 with Jory Sims, NP.  Per office protocol, the provider should assess clearance at time of office visit and should forward their finalized clearance decision to requesting party below. I will remove this message from the pre-op box.

## 2018-06-27 NOTE — Telephone Encounter (Signed)
   [facility] Pre-operative Risk Assessment    Request for surgical clearance:  1. What type of surgery is being performed? Left shoulder arthroscopic debridement, subacromial decompression, poss. Bicep tenodesis  2. When is this surgery scheduled? 07/03/2018  3. What type of clearance is required (medical clearance vs. Pharmacy clearance to hold med vs. Both)? medical  4. Are there any medications that need to be held prior to surgery and how long? none  5. Practice name and name of physician performing surgery? [facility]  6. What is your office phone number [phone]   7.   What is your office fax number [phone]  8.   Anesthesia type (None, local, MAC, general) ? general   Justin Ray 06/27/2018, 4:33 PM  _________________________________________________________________   (provider comments below)

## 2018-06-29 NOTE — Progress Notes (Signed)
Virtual Visit via Telephone Note   This visit type was conducted due to national recommendations for restrictions regarding the COVID-19 Pandemic (e.g. social distancing) in an effort to limit this patient's exposure and mitigate transmission in our community.  Due to his co-morbid illnesses, this patient is at least at moderate risk for complications without adequate follow up.  This format is felt to be most appropriate for this patient at this time.  The patient did not have access to video technology/had technical difficulties with video requiring transitioning to audio format only (telephone).  All issues noted in this document were discussed and addressed.  No physical exam could be performed with this format.  Please refer to the patient's chart for his  consent to telehealth for [facility].   Date:  06/30/2018   ID:  Justin Ray, DOB 1965-10-28, MRN [id number]  Patient Location: Home Provider Location: Home  PCP:  Townsend Roger, MD  Cardiologist:  Quay Burow, MD  Electrophysiologist:  None   Evaluation Performed:  Follow-Up Visit  Chief Complaint:  Pre-Operative Cardiac Clearance.   History of Present Illness:    Justin Ray is a 53 y.o. male with know history of NICM, ICD in situ with generator change in 11/2012 ([place]) by Dr. Lovena Le. hypertension, HLD, with other history of OSA but not prescribed CPAP as his sleep study did not reveal O2 sat below 85%, tobacco abuse, and anxiety.  He is being seen today for cardiac evaluation prior to left shoulder arthroscopic debridement, subacromial decompression, and possible bicep tendonitis via [facility],with general anesthesia, on 07/03/2018.   On last visit with our office he was seen by Almyra Deforest, PA on 02/19/2018, and had complaints of palpitations. He had his device check on 06/23/2018 which revealed 10 episodes of NSVT.No medication changes were made on review by Dr. Lovena Le. Echo was completed in  February 2020 which revealed EF of 50%. No valvular abnormalities.   The patient does not have symptoms concerning for COVID-19 infection (fever, chills, cough, or new shortness of breath).   He is without cardiac complaints today. He has has some rare palpitations but is not having complaints of them now. He is followed by PCP for labs, which were completed 2 weeks ago. He is medically compliant and has had not complaints of chest pain or DOE.    Past Medical History:  Diagnosis Date  . AICD (automatic cardioverter/defibrillator) present   . Anxiety   . Cancer ([place])    skin cancer  . Cardiomyopathy   . Carpal tunnel syndrome 10/14   right-GSC  . Cataract   . CHF (congestive heart failure) ([place])   . Depression   . Diabetes mellitus    TYPE 2 , DIAGNOSED AS AN ADULT   . Dyslipidemia   . Hypertension   . ICD (implantable cardioverter-defibrillator) in place   . Pneumonia OVER 10 YEARS AGO   . Sleep apnea    was tested many yr ago-does not use a cpap-  . Wears glasses    Past Surgical History:  Procedure Laterality Date  . ANTERIOR CERVICAL DECOMP/DISCECTOMY FUSION N/A 03/06/2017   Procedure: ANTERIOR CERVICAL DECOMPRESSION FUSION,CERVICAL 6-7 WITH INSTRUMENTATION AND ALLOGRAFT REQUESTED TIME 2.5 HRS;  Surgeon: Phylliss Bob, MD;  Location: [place];  Service: Orthopedics;  Laterality: N/A;  ANTERIOR CERVICAL DECOMPRESSION FUSION, CERVICAL 6-7 WITH INSTRUMENTATION AND ALLOGRAFT REQUESTED TIME 2.5 HRS  . CARDIAC DEFIBRILLATOR PLACEMENT  02/01/2004  . CARDIAC DEFIBRILLATOR PLACEMENT  11/14   replaced  with new one  . CARPAL TUNNEL RELEASE Left 12/19/2012   Procedure: LEFT CARPAL TUNNEL RELEASE;  Surgeon: Roseanne Kaufman, MD;  Location: [place];  Service: Orthopedics;  Laterality: Left;  . CARPAL TUNNEL RELEASE Right 02/1999  . EYE SURGERY Bilateral    cataract surgery with lens implants  . IMPLANTABLE CARDIOVERTER DEFIBRILLATOR (ICD) GENERATOR CHANGE N/A 11/21/2012    Procedure: ICD GENERATOR CHANGE;  Surgeon: Evans Lance, MD;  Location: [facility] CATH LAB;  Service: Cardiovascular;  Laterality: N/A;  . TONSILLECTOMY       No outpatient medications have been marked as taking for the 06/30/18 encounter (Telemedicine) with Lendon Colonel, NP.     Allergies:   Codeine, Niaspan [niacin er], and Sulfonamide derivatives   Social History   Tobacco Use  . Smoking status: Current Every Day Smoker    Packs/day: 1.50    Types: Cigarettes  . Smokeless tobacco: Never Used  Substance Use Topics  . Alcohol use: Yes    Comment: A SIX PACK EVERY SIX MONTHS   . Drug use: No     Family Hx: The patient's family history includes Cancer in his mother and another family member; Coronary artery disease in his father; Heart disease in his brother.  ROS:   Please see the history of present illness.    All other systems reviewed and are negative.   Prior CV studies:   The following studies were reviewed today:  Echocardiogram 03/03/2018 1. The left ventricle has a visually estimated ejection fraction of of 50%. The cavity size was normal. Left ventricular diastolic Doppler parameters are consistent with impaired relaxation.  2. The right ventricle has normal systolic function. The cavity was normal. There is no increase in right ventricular wall thickness.  3. The mitral valve is normal in structure.  4. The tricuspid valve is normal in structure.  5. The aortic valve is tricuspid.  6. The pulmonic valve was normal in structure.  7. Right atrial pressure is estimated at 3 mmHg.  Device Check 06/23/2018 Scheduled remote reviewed. Normal device function. 10 NST episodes all on 06/14/18 [id number]. R-R is irregular with rates 160's.  Notes recorded by Evans Lance, MD on 06/23/2018 at 8:14 PM EDT  Remote device check reviewed. Histograms appropriate. Leads and battery stable for patient. Follow up as outlined above. No recommended changes.  Labs/Other Tests and  Data Reviewed:    EKG:  No ECG reviewed.  Recent Labs: 06/27/2018: BUN 17; Creatinine, Ser 1.01; Hemoglobin 17.7; Platelets 175; Potassium 4.9; Sodium 136   Recent Lipid Panel Lab Results  Component Value Date/Time   CHOL 83 (L) 05/06/2015 05:23 PM   TRIG 181 (H) 05/06/2015 05:23 PM   HDL 32 (L) 05/06/2015 05:23 PM   CHOLHDL 2.6 05/06/2015 05:23 PM   CHOLHDL 2.7 02/11/2015 09:58 AM   LDLCALC 15 05/06/2015 05:23 PM    Wt Readings from Last 3 Encounters:  06/27/18 186 lb 6 oz (84.5 kg)  02/19/18 187 lb (84.8 kg)  09/09/17 189 lb 12.8 oz (86.1 kg)     Objective:    Vital Signs:  BP 126/86   Pulse 80  -Limited by telephone visit  VITAL SIGNS:  reviewed GEN:  no acute distress NEURO:  alert and oriented x 3, no obvious focal deficit PSYCH:  normal affect  ASSESSMENT & PLAN:    Pre-Operative Cardiology Evaluation:            Chart reviewed as part of pre-operative protocol coverage.  Given past medical history and time since last visit, based on ACC/AHA guidelines, Kirin Pastorino would be at acceptable risk for the planned procedure without further cardiovascular testing.  Okay to take ASA unless surgeon prefers that he stops for surgery. Will need to restart ASAP after procedure.   2. Chronic Systolic CHF with NICM : He denies symptoms of fluid overload, edema or DOE. Continue coreg, HCTZ and digoxin.   3. Hyperlipidemia; Labs are followed by PCP. Not on statin at this time.   4. Diabetes: Followed by PCP.   5. ICD  In situ: [employer]. Followed by Dr. Lovena Le.  COVID-19 Education: The signs and symptoms of COVID-19 were discussed with the patient and how to seek care for testing (follow up with PCP or arrange E-visit).  The importance of social distancing was discussed today.  Time:   Today, I have spent 15 minutes with the patient with telehealth technology discussing the above problems.     Medication Adjustments/Labs and Tests Ordered: Current medicines are  reviewed at length with the patient today.  Concerns regarding medicines are outlined above.   Tests Ordered: No orders of the defined types were placed in this encounter.   Medication Changes: No orders of the defined types were placed in this encounter.   Disposition:  Follow up 6 months with Dr. Claiborne Billings only.   Signed, Phill Myron. [place] Pugh, ANP, AACC  06/30/2018 8:13 AM    [facility]

## 2018-06-30 ENCOUNTER — Other Ambulatory Visit: Payer: Self-pay

## 2018-06-30 ENCOUNTER — Other Ambulatory Visit (HOSPITAL_COMMUNITY)
Admission: RE | Admit: 2018-06-30 | Discharge: 2018-06-30 | Disposition: A | Discharge status: Home / Self Care | Payer: 59 | Source: Ambulatory Visit | Attending: Orthopedic Surgery | Admitting: Orthopedic Surgery

## 2018-06-30 ENCOUNTER — Telehealth (INDEPENDENT_AMBULATORY_CARE_PROVIDER_SITE_OTHER): Payer: 59 | Admitting: Adult Health

## 2018-06-30 ENCOUNTER — Encounter: Payer: Self-pay | Admitting: Adult Health

## 2018-06-30 VITALS — BP 126/86 | HR 80

## 2018-06-30 DIAGNOSIS — I428 Other cardiomyopathies: Secondary | ICD-10-CM | POA: Diagnosis not present

## 2018-06-30 DIAGNOSIS — I472 Ventricular tachycardia: Secondary | ICD-10-CM

## 2018-06-30 DIAGNOSIS — Z01818 Encounter for other preprocedural examination: Secondary | ICD-10-CM

## 2018-06-30 DIAGNOSIS — Z1159 Encounter for screening for other viral diseases: Secondary | ICD-10-CM | POA: Insufficient documentation

## 2018-06-30 DIAGNOSIS — Z95810 Presence of automatic (implantable) cardiac defibrillator: Secondary | ICD-10-CM

## 2018-06-30 DIAGNOSIS — E7800 Pure hypercholesterolemia, unspecified: Secondary | ICD-10-CM

## 2018-06-30 DIAGNOSIS — I10 Essential (primary) hypertension: Secondary | ICD-10-CM

## 2018-06-30 DIAGNOSIS — I4729 Other ventricular tachycardia: Secondary | ICD-10-CM

## 2018-06-30 NOTE — Patient Instructions (Addendum)
CLEARED FOR SHOULDER SURGERY, WE WILL CONTACT SURGEON'S OFFICE AND LET THEM KNOW.  Follow-Up: You will need a follow up appointment in 6 months.  Please call our office 2 months in advance, October 2020 to schedule this appointment.  You may see [name], MD Jory Sims, DNP, AACC  or one of the following Advanced Practice Providers on your designated Care Team:  Kerin Ransom, [place] Roby Lofts, PA-C Sande Rives, [place]       Medication Instructions:  NO CHANGES- Your physician recommends that you continue on your current medications as directed. Please refer to the Current Medication list given to you today. If you need a refill on your cardiac medications before your next appointment, please call your pharmacy. Labwork: When you have labs (blood work) and your tests are completely normal, you will receive your results ONLY by [place] (if you have MyChart) -OR- A paper copy in the mail.  At [facility], you and your health needs are our priority.  As part of our continuing mission to provide you with exceptional heart care, we have created designated Provider Care Teams.  These Care Teams include your primary Cardiologist (physician) and Advanced Practice Providers (APPs -  Physician Assistants and Nurse Practitioners) who all work together to provide you with the care you need, when you need it.  Thank you for choosing [name] at [facility]!!

## 2018-07-01 LAB — NOVEL CORONAVIRUS, NAA (HOSP ORDER, SEND-OUT TO REF LAB; TAT 18-24 HRS): SARS-CoV-2, NAA: NOT DETECTED

## 2018-07-01 NOTE — Progress Notes (Signed)
Anesthesia Chart Review   Case: [id number] Date/Time: 07/03/18 1006   Procedures:      ARTHROSCOPY SHOULDER (Left )     SHOULDER ARTHROSCOPY WITH SUBACROMIAL DECOMPRESSION POSSIBLE BICEP TENODESIS (Left )   Anesthesia type: Choice   Pre-op diagnosis: LEFT SHOULDER PAIN, POSSIBLE LABRAL TEAR, IMPINGEMENT   [facility] ROOM 07 / [facility]   Surgeon: Tania Ade, MD      DISCUSSION: 53 yo current every day smoker with h/o CHF, dyslipidemia, HTN, DM II, anxiety, depression, ICD in place (perioperative prescription on chart), left shoulder pain, possible labral tear, impingement scheduled for above procedure 07/03/2018 with Dr. Tania Ade.    Pt last seen by cardiology via telemedicine 06/30/2018.  Seen by Jory Sims, NP.  Per note, "Given past medical history and time since last visit, based on ACC/AHA guidelines, [facility] would be at acceptable risk for the planned procedure without further cardiovascular testing.  Okay to take ASA unless surgeon prefers that he stops for surgery. Will need to restart ASAP after procedure."  Pt can proceed with planned procedure barring acute status change.  VS: BP 136/77 (BP Location: Right Arm)   Pulse 83   Temp 36.8 C (Oral)   Resp 18   Ht '5\' 8"'$  (1.727 m)   Wt 84.5 kg   SpO2 98%   BMI 28.34 kg/m   PROVIDERS: Townsend Roger, MD is PCP   Quay Burow, MD is Cardiologist  LABS: Labs reviewed: Acceptable for surgery. (all labs ordered are listed, but only abnormal results are displayed)  Labs Reviewed  GLUCOSE, CAPILLARY - Abnormal; Notable for the following components:      Result Value   Glucose-Capillary 142 (*)    All other components within normal limits  CBC - Abnormal; Notable for the following components:   Hemoglobin 17.7 (*)    HCT 54.2 (*)    All other components within normal limits  BASIC METABOLIC PANEL - Abnormal; Notable for the following components:   Glucose, Bld 121 (*)    All other components within  normal limits     IMAGES:   EKG: 02/19/2018 Rate 92 bpm NSR  CV: Echo 03/03/2018 IMPRESSIONS    1. The left ventricle has a visually estimated ejection fraction of of 50%. The cavity size was normal. Left ventricular diastolic Doppler parameters are consistent with impaired relaxation.  2. The right ventricle has normal systolic function. The cavity was normal. There is no increase in right ventricular wall thickness.  3. The mitral valve is normal in structure.  4. The tricuspid valve is normal in structure.  5. The aortic valve is tricuspid.  6. The pulmonic valve was normal in structure.  7. Right atrial pressure is estimated at 3 mmHg. Past Medical History:  Diagnosis Date  . AICD (automatic cardioverter/defibrillator) present   . Anxiety   . Cancer ([place])    skin cancer  . Cardiomyopathy   . Carpal tunnel syndrome 10/14   right-GSC  . Cataract   . CHF (congestive heart failure) ([place])   . Depression   . Diabetes mellitus    TYPE 2 , DIAGNOSED AS AN ADULT   . Dyslipidemia   . Hypertension   . ICD (implantable cardioverter-defibrillator) in place   . Pneumonia OVER 10 YEARS AGO   . Sleep apnea    was tested many yr ago-does not use a cpap-  . Wears glasses     Past Surgical History:  Procedure Laterality Date  . ANTERIOR  CERVICAL DECOMP/DISCECTOMY FUSION N/A 03/06/2017   Procedure: ANTERIOR CERVICAL DECOMPRESSION FUSION,CERVICAL 6-7 WITH INSTRUMENTATION AND ALLOGRAFT REQUESTED TIME 2.5 HRS;  Surgeon: Phylliss Bob, MD;  Location: [place];  Service: Orthopedics;  Laterality: N/A;  ANTERIOR CERVICAL DECOMPRESSION FUSION, CERVICAL 6-7 WITH INSTRUMENTATION AND ALLOGRAFT REQUESTED TIME 2.5 HRS  . CARDIAC DEFIBRILLATOR PLACEMENT  02/01/2004  . CARDIAC DEFIBRILLATOR PLACEMENT  11/14   replaced with new one  . CARPAL TUNNEL RELEASE Left 12/19/2012   Procedure: LEFT CARPAL TUNNEL RELEASE;  Surgeon: Roseanne Kaufman, MD;  Location: [place];  Service:  Orthopedics;  Laterality: Left;  . CARPAL TUNNEL RELEASE Right 02/1999  . EYE SURGERY Bilateral    cataract surgery with lens implants  . IMPLANTABLE CARDIOVERTER DEFIBRILLATOR (ICD) GENERATOR CHANGE N/A 11/21/2012   Procedure: ICD GENERATOR CHANGE;  Surgeon: Evans Lance, MD;  Location: [facility] CATH LAB;  Service: Cardiovascular;  Laterality: N/A;  . TONSILLECTOMY      MEDICATIONS: . ALPRAZolam (XANAX) 0.5 MG tablet  . ARIPiprazole (ABILIFY) 2 MG tablet  . aspirin 81 MG tablet  . blood glucose meter kit and supplies KIT  . buPROPion (WELLBUTRIN SR) 150 MG 12 hr tablet  . carvedilol (COREG) 25 MG tablet  . desvenlafaxine (PRISTIQ) 100 MG 24 hr tablet  . DIGOX 125 MCG tablet  . hydrochlorothiazide (HYDRODIURIL) 25 MG tablet  . ibuprofen (ADVIL) 200 MG tablet  . insulin NPH-regular Human (HUMULIN 70/30) (70-30) 100 UNIT/ML injection  . lisinopril (PRINIVIL,ZESTRIL) 20 MG tablet  . metFORMIN (GLUCOPHAGE-XR) 500 MG 24 hr tablet  . nystatin cream (MYCOSTATIN)  . triamcinolone cream (KENALOG) 0.1 %  . VASCEPA 1 g CAPS  . VICTOZA 18 MG/3ML SOPN   No current facility-administered medications for this encounter.     Maia Plan [facility] Pre-Surgical Testing [phone] 07/01/18 1:50 PM

## 2018-07-01 NOTE — Anesthesia Preprocedure Evaluation (Addendum)
Anesthesia Evaluation  Patient identified by MRN, date of birth, ID band Patient awake    Reviewed: Allergy & Precautions, NPO status , Patient's Chart, lab work & pertinent test results  Airway Mallampati: II  TM Distance: >3 FB Neck ROM: Full    Dental no notable dental hx.    Pulmonary asthma , sleep apnea , COPD, Current Smoker,    Pulmonary exam normal breath sounds clear to auscultation       Cardiovascular hypertension, Pt. on medications Normal cardiovascular exam+ Cardiac Defibrillator  Rhythm:Regular Rate:Normal     Neuro/Psych Anxiety Depression negative neurological ROS  negative psych ROS   GI/Hepatic negative GI ROS, Neg liver ROS,   Endo/Other  diabetes, Type 2, Insulin Dependent, Oral Hypoglycemic Agents  Renal/GU negative Renal ROS  negative genitourinary   Musculoskeletal negative musculoskeletal ROS (+)   Abdominal   Peds negative pediatric ROS (+)  Hematology negative hematology ROS (+)   Anesthesia Other Findings   Reproductive/Obstetrics negative OB ROS                           Anesthesia Physical Anesthesia Plan  ASA: III  Anesthesia Plan: General   Post-op Pain Management:  Regional for Post-op pain   Induction: Intravenous  PONV Risk Score and Plan: 1 and Ondansetron and Treatment may vary due to age or medical condition  Airway Management Planned: Oral ETT  Additional Equipment:   Intra-op Plan:   Post-operative Plan: Extubation in OR  Informed Consent: I have reviewed the patients History and Physical, chart, labs and discussed the procedure including the risks, benefits and alternatives for the proposed anesthesia with the patient or authorized representative who has indicated his/her understanding and acceptance.     Dental advisory given  Plan Discussed with: CRNA  Anesthesia Plan Comments: (See PAT note 06/27/2018, Konrad Felix, PA-C)        Anesthesia Quick Evaluation

## 2018-07-02 ENCOUNTER — Encounter (HOSPITAL_BASED_OUTPATIENT_CLINIC_OR_DEPARTMENT_OTHER): Payer: Self-pay | Admitting: *Deleted

## 2018-07-02 ENCOUNTER — Other Ambulatory Visit: Payer: Self-pay

## 2018-07-02 NOTE — Progress Notes (Signed)
Pt [facility] has now [facility] to [facility] same day 07-03-2018 @ 1000.  Pt had PAT appointment at [facility] 06-27-2018.    The following is with chart / epic: EKG dated 02-19-2018 ECHO dated 03-03-2018 ICD device orders dated 06-27-2018 Cardiac clearance dated 06-30-2018  Last pacer/ icd check dated 06-23-2018 CBC, BMP result dated 06-27-2018 A1c result dated 06-18-2018 in chart , from pt's pcp. Reviewed chart w/ anesthesia, Konrad Felix PA.  Janett Billow stated pt ok to proceed at [facility].  Called and spoke w/ pt via phone to update pt instructions for dos.  Pt verbalized understanding surgery is moved to [facility].  To be npo after mn w/ exception clear liquid diet until 0430 at which time complete G2 drink (given to him at PAT appt) then nothing by mouth.  Arrive at 0800.  Pt verbalized understanding want medications for him to take am dos given to him at PAT appt.  Pt verbalized understanding no diabetic medication of any type am dos and to to 70% dose of NPH/reg insulin night before surgery.  Pt had no further questions.

## 2018-07-02 NOTE — Progress Notes (Signed)
SPOKE W/  Laurence Ferrari     SCREENING SYMPTOMS OF COVID 19:   COUGH--no  RUNNY NOSE--- no  SORE THROAT---no  NASAL CONGESTION----no  SNEEZING----no  SHORTNESS OF BREATH---no  DIFFICULTY BREATHING---no  TEMP >100.0 -----no  UNEXPLAINED BODY ACHES------no  CHILLS -------- no  HEADACHES ---------no  LOSS OF SMELL/ TASTE --------no    HAVE YOU OR ANY FAMILY MEMBER TRAVELLED PAST 14 DAYS OUT OF THE   COUNTY---no STATE----no COUNTRY----no  HAVE YOU OR ANY FAMILY MEMBER BEEN EXPOSED TO ANYONE WITH COVID 19?  no

## 2018-07-03 ENCOUNTER — Ambulatory Visit (HOSPITAL_BASED_OUTPATIENT_CLINIC_OR_DEPARTMENT_OTHER): Payer: No Typology Code available for payment source | Admitting: Physician Assistant

## 2018-07-03 ENCOUNTER — Ambulatory Visit (HOSPITAL_BASED_OUTPATIENT_CLINIC_OR_DEPARTMENT_OTHER): Payer: No Typology Code available for payment source | Admitting: Anesthesiology

## 2018-07-03 ENCOUNTER — Encounter (HOSPITAL_BASED_OUTPATIENT_CLINIC_OR_DEPARTMENT_OTHER): Payer: Self-pay | Admitting: Emergency Medicine

## 2018-07-03 ENCOUNTER — Observation Stay (HOSPITAL_BASED_OUTPATIENT_CLINIC_OR_DEPARTMENT_OTHER)
Admission: RE | Admit: 2018-07-03 | Discharge: 2018-07-04 | Disposition: A | Discharge status: Home / Self Care | Payer: No Typology Code available for payment source | Source: Ambulatory Visit | Attending: Orthopedic Surgery | Admitting: Orthopedic Surgery

## 2018-07-03 ENCOUNTER — Other Ambulatory Visit: Payer: Self-pay

## 2018-07-03 ENCOUNTER — Encounter (HOSPITAL_BASED_OUTPATIENT_CLINIC_OR_DEPARTMENT_OTHER)
Admission: RE | Disposition: A | Discharge status: Home / Self Care | Payer: Self-pay | Source: Ambulatory Visit | Attending: Orthopedic Surgery

## 2018-07-03 DIAGNOSIS — G4730 Sleep apnea, unspecified: Secondary | ICD-10-CM | POA: Insufficient documentation

## 2018-07-03 DIAGNOSIS — E785 Hyperlipidemia, unspecified: Secondary | ICD-10-CM | POA: Insufficient documentation

## 2018-07-03 DIAGNOSIS — Z885 Allergy status to narcotic agent status: Secondary | ICD-10-CM | POA: Diagnosis not present

## 2018-07-03 DIAGNOSIS — I110 Hypertensive heart disease with heart failure: Secondary | ICD-10-CM | POA: Diagnosis not present

## 2018-07-03 DIAGNOSIS — S46112A Strain of muscle, fascia and tendon of long head of biceps, left arm, initial encounter: Secondary | ICD-10-CM | POA: Diagnosis not present

## 2018-07-03 DIAGNOSIS — S43432A Superior glenoid labrum lesion of left shoulder, initial encounter: Secondary | ICD-10-CM | POA: Diagnosis not present

## 2018-07-03 DIAGNOSIS — Z882 Allergy status to sulfonamides status: Secondary | ICD-10-CM | POA: Diagnosis not present

## 2018-07-03 DIAGNOSIS — I509 Heart failure, unspecified: Secondary | ICD-10-CM | POA: Diagnosis not present

## 2018-07-03 DIAGNOSIS — E119 Type 2 diabetes mellitus without complications: Secondary | ICD-10-CM | POA: Diagnosis not present

## 2018-07-03 DIAGNOSIS — Z9889 Other specified postprocedural states: Secondary | ICD-10-CM | POA: Diagnosis present

## 2018-07-03 DIAGNOSIS — Z85828 Personal history of other malignant neoplasm of skin: Secondary | ICD-10-CM | POA: Diagnosis not present

## 2018-07-03 DIAGNOSIS — X58XXXA Exposure to other specified factors, initial encounter: Secondary | ICD-10-CM | POA: Diagnosis not present

## 2018-07-03 DIAGNOSIS — F329 Major depressive disorder, single episode, unspecified: Secondary | ICD-10-CM | POA: Insufficient documentation

## 2018-07-03 DIAGNOSIS — Z79899 Other long term (current) drug therapy: Secondary | ICD-10-CM | POA: Diagnosis not present

## 2018-07-03 DIAGNOSIS — M7542 Impingement syndrome of left shoulder: Secondary | ICD-10-CM | POA: Insufficient documentation

## 2018-07-03 DIAGNOSIS — Z8249 Family history of ischemic heart disease and other diseases of the circulatory system: Secondary | ICD-10-CM | POA: Diagnosis not present

## 2018-07-03 DIAGNOSIS — Z800 Family history of malignant neoplasm of digestive organs: Secondary | ICD-10-CM | POA: Insufficient documentation

## 2018-07-03 DIAGNOSIS — I429 Cardiomyopathy, unspecified: Secondary | ICD-10-CM | POA: Insufficient documentation

## 2018-07-03 DIAGNOSIS — Z809 Family history of malignant neoplasm, unspecified: Secondary | ICD-10-CM | POA: Insufficient documentation

## 2018-07-03 DIAGNOSIS — Z981 Arthrodesis status: Secondary | ICD-10-CM | POA: Diagnosis not present

## 2018-07-03 DIAGNOSIS — Z888 Allergy status to other drugs, medicaments and biological substances status: Secondary | ICD-10-CM | POA: Insufficient documentation

## 2018-07-03 DIAGNOSIS — Z95810 Presence of automatic (implantable) cardiac defibrillator: Secondary | ICD-10-CM | POA: Diagnosis not present

## 2018-07-03 HISTORY — PX: SHOULDER ARTHROSCOPY: SHX128

## 2018-07-03 HISTORY — PX: SHOULDER ARTHROSCOPY WITH SUBACROMIAL DECOMPRESSION AND BICEP TENDON REPAIR: SHX5689

## 2018-07-03 LAB — GLUCOSE, CAPILLARY
Glucose-Capillary: 99 mg/dL (ref 70–99)
Glucose-Capillary: 75 mg/dL (ref 70–99)
Glucose-Capillary: 158 mg/dL — ABNORMAL HIGH (ref 70–99)
Glucose-Capillary: 143 mg/dL — ABNORMAL HIGH (ref 70–99)

## 2018-07-03 SURGERY — ARTHROSCOPY, SHOULDER
Anesthesia: General | Site: Shoulder | Laterality: Left

## 2018-07-03 MED ORDER — ONDANSETRON HCL 4 MG/2ML IJ SOLN
INTRAMUSCULAR | Status: DC | PRN
  Administered 2018-07-03: 4 mg via INTRAVENOUS

## 2018-07-03 MED ORDER — CEFAZOLIN SODIUM-DEXTROSE 2-4 GM/100ML-% IV SOLN
2.0000 g | INTRAVENOUS | Status: AC
  Administered 2018-07-03: 2 g via INTRAVENOUS
  Filled 2018-07-03: qty 100

## 2018-07-03 MED ORDER — EPHEDRINE 5 MG/ML INJ
INTRAVENOUS | Status: AC
  Filled 2018-07-03: qty 10

## 2018-07-03 MED ORDER — ONDANSETRON HCL 4 MG/2ML IJ SOLN
4.0000 mg | Freq: Four times a day (QID) | INTRAMUSCULAR | Status: DC | PRN
  Filled 2018-07-03: qty 2

## 2018-07-03 MED ORDER — METOCLOPRAMIDE HCL 5 MG PO TABS
5.0000 mg | ORAL_TABLET | Freq: Three times a day (TID) | ORAL | Status: DC | PRN
  Filled 2018-07-03: qty 2

## 2018-07-03 MED ORDER — ACETAMINOPHEN 500 MG PO TABS
1000.0000 mg | ORAL_TABLET | Freq: Four times a day (QID) | ORAL | Status: DC
  Administered 2018-07-03 – 2018-07-04 (×3): 1000 mg via ORAL
  Filled 2018-07-03: qty 2

## 2018-07-03 MED ORDER — DIPHENHYDRAMINE HCL 12.5 MG/5ML PO ELIX
12.5000 mg | ORAL_SOLUTION | ORAL | Status: DC | PRN
  Filled 2018-07-03: qty 10

## 2018-07-03 MED ORDER — PROPOFOL 10 MG/ML IV BOLUS
INTRAVENOUS | Status: DC | PRN
  Administered 2018-07-03: 180 mg via INTRAVENOUS

## 2018-07-03 MED ORDER — INSULIN ASPART 100 UNIT/ML ~~LOC~~ SOLN
SUBCUTANEOUS | Status: AC
  Filled 2018-07-03: qty 1

## 2018-07-03 MED ORDER — DOCUSATE SODIUM 100 MG PO CAPS
ORAL_CAPSULE | ORAL | Status: AC
  Filled 2018-07-03: qty 1

## 2018-07-03 MED ORDER — METOCLOPRAMIDE HCL 5 MG/ML IJ SOLN
10.0000 mg | Freq: Once | INTRAMUSCULAR | Status: DC | PRN
  Filled 2018-07-03: qty 2

## 2018-07-03 MED ORDER — MENTHOL 3 MG MT LOZG
1.0000 | LOZENGE | OROMUCOSAL | Status: DC | PRN
  Filled 2018-07-03: qty 9

## 2018-07-03 MED ORDER — DIGOXIN 125 MCG PO TABS
0.1250 mg | ORAL_TABLET | Freq: Every day | ORAL | Status: DC
  Administered 2018-07-04: 0.125 mg via ORAL
  Filled 2018-07-03 (×2): qty 1

## 2018-07-03 MED ORDER — FENTANYL CITRATE (PF) 100 MCG/2ML IJ SOLN
25.0000 ug | INTRAMUSCULAR | Status: DC | PRN
  Filled 2018-07-03: qty 1

## 2018-07-03 MED ORDER — ALUMINUM HYDROXIDE GEL 320 MG/5ML PO SUSP
15.0000 mL | ORAL | Status: DC | PRN
  Filled 2018-07-03: qty 30

## 2018-07-03 MED ORDER — SUGAMMADEX SODIUM 200 MG/2ML IV SOLN
INTRAVENOUS | Status: AC
  Filled 2018-07-03: qty 2

## 2018-07-03 MED ORDER — ACETAMINOPHEN 500 MG PO TABS
ORAL_TABLET | ORAL | Status: AC
  Filled 2018-07-03: qty 2

## 2018-07-03 MED ORDER — SODIUM CHLORIDE 0.9 % IR SOLN
Status: DC | PRN
  Administered 2018-07-03: 6000 mL

## 2018-07-03 MED ORDER — SUGAMMADEX SODIUM 200 MG/2ML IV SOLN
INTRAVENOUS | Status: DC | PRN
  Administered 2018-07-03: 300 mg via INTRAVENOUS

## 2018-07-03 MED ORDER — LIDOCAINE HCL (CARDIAC) PF 100 MG/5ML IV SOSY
PREFILLED_SYRINGE | INTRAVENOUS | Status: DC | PRN
  Administered 2018-07-03: 100 mg via INTRAVENOUS

## 2018-07-03 MED ORDER — MIDAZOLAM HCL 2 MG/2ML IJ SOLN
2.0000 mg | Freq: Once | INTRAMUSCULAR | Status: AC
  Administered 2018-07-03: 2 mg via INTRAVENOUS
  Filled 2018-07-03: qty 2

## 2018-07-03 MED ORDER — METFORMIN HCL ER 500 MG PO TB24
1000.0000 mg | ORAL_TABLET | Freq: Two times a day (BID) | ORAL | Status: DC
  Administered 2018-07-03: 1000 mg via ORAL
  Filled 2018-07-03 (×2): qty 2

## 2018-07-03 MED ORDER — ONDANSETRON HCL 4 MG/2ML IJ SOLN
INTRAMUSCULAR | Status: AC
  Filled 2018-07-03: qty 2

## 2018-07-03 MED ORDER — VENLAFAXINE HCL ER 150 MG PO CP24
150.0000 mg | ORAL_CAPSULE | Freq: Every day | ORAL | Status: DC
  Filled 2018-07-03 (×2): qty 1

## 2018-07-03 MED ORDER — HYDROMORPHONE HCL 1 MG/ML IJ SOLN
0.5000 mg | INTRAMUSCULAR | Status: DC | PRN
  Filled 2018-07-03: qty 1

## 2018-07-03 MED ORDER — BUPIVACAINE LIPOSOME 1.3 % IJ SUSP
INTRAMUSCULAR | Status: DC | PRN
  Administered 2018-07-03: 10 mL

## 2018-07-03 MED ORDER — PROPOFOL 10 MG/ML IV BOLUS
INTRAVENOUS | Status: AC
  Filled 2018-07-03: qty 20

## 2018-07-03 MED ORDER — CEFAZOLIN SODIUM-DEXTROSE 2-4 GM/100ML-% IV SOLN
INTRAVENOUS | Status: AC
  Filled 2018-07-03: qty 100

## 2018-07-03 MED ORDER — ASPIRIN 81 MG PO CHEW
81.0000 mg | CHEWABLE_TABLET | Freq: Every day | ORAL | Status: DC
  Filled 2018-07-03: qty 1

## 2018-07-03 MED ORDER — CARVEDILOL 25 MG PO TABS
25.0000 mg | ORAL_TABLET | Freq: Two times a day (BID) | ORAL | Status: DC
  Administered 2018-07-03: 25 mg via ORAL
  Filled 2018-07-03 (×2): qty 1

## 2018-07-03 MED ORDER — ARIPIPRAZOLE 2 MG PO TABS
2.0000 mg | ORAL_TABLET | Freq: Every day | ORAL | Status: DC
  Administered 2018-07-04: 2 mg via ORAL
  Filled 2018-07-03 (×2): qty 1

## 2018-07-03 MED ORDER — PHENYLEPHRINE 40 MCG/ML (10ML) SYRINGE FOR IV PUSH (FOR BLOOD PRESSURE SUPPORT)
PREFILLED_SYRINGE | INTRAVENOUS | Status: AC
  Filled 2018-07-03: qty 10

## 2018-07-03 MED ORDER — SODIUM CHLORIDE 0.9 % IV SOLN
INTRAVENOUS | Status: DC
  Filled 2018-07-03: qty 1000

## 2018-07-03 MED ORDER — SENNOSIDES-DOCUSATE SODIUM 8.6-50 MG PO TABS
1.0000 | ORAL_TABLET | Freq: Every evening | ORAL | Status: DC | PRN
  Filled 2018-07-03: qty 1

## 2018-07-03 MED ORDER — HYDROCHLOROTHIAZIDE 25 MG PO TABS
25.0000 mg | ORAL_TABLET | Freq: Every day | ORAL | Status: DC
  Administered 2018-07-03: 25 mg via ORAL
  Filled 2018-07-03 (×2): qty 1

## 2018-07-03 MED ORDER — LACTATED RINGERS IV SOLN
INTRAVENOUS | Status: DC
  Administered 2018-07-03: 13:00:00 via INTRAVENOUS
  Filled 2018-07-03 (×2): qty 1000

## 2018-07-03 MED ORDER — ONDANSETRON HCL 4 MG PO TABS
4.0000 mg | ORAL_TABLET | Freq: Four times a day (QID) | ORAL | Status: DC | PRN
  Filled 2018-07-03: qty 1

## 2018-07-03 MED ORDER — LACTATED RINGERS IV SOLN
INTRAVENOUS | Status: DC
  Administered 2018-07-03 (×2): via INTRAVENOUS
  Filled 2018-07-03: qty 1000

## 2018-07-03 MED ORDER — MIDAZOLAM HCL 5 MG/5ML IJ SOLN
INTRAMUSCULAR | Status: DC | PRN
  Administered 2018-07-03: 1 mg via INTRAVENOUS

## 2018-07-03 MED ORDER — OXYCODONE HCL 5 MG PO TABS
10.0000 mg | ORAL_TABLET | ORAL | Status: DC | PRN
  Filled 2018-07-03: qty 3

## 2018-07-03 MED ORDER — ALPRAZOLAM 0.5 MG PO TABS
0.5000 mg | ORAL_TABLET | Freq: Every day | ORAL | Status: DC | PRN
  Filled 2018-07-03: qty 1

## 2018-07-03 MED ORDER — MIDAZOLAM HCL 2 MG/2ML IJ SOLN
INTRAMUSCULAR | Status: AC
  Filled 2018-07-03: qty 2

## 2018-07-03 MED ORDER — LISINOPRIL 20 MG PO TABS
20.0000 mg | ORAL_TABLET | Freq: Two times a day (BID) | ORAL | Status: DC
  Administered 2018-07-03: 20 mg via ORAL
  Filled 2018-07-03 (×2): qty 1

## 2018-07-03 MED ORDER — FENTANYL CITRATE (PF) 100 MCG/2ML IJ SOLN
100.0000 ug | Freq: Once | INTRAMUSCULAR | Status: AC
  Administered 2018-07-03: 100 ug via INTRAVENOUS
  Filled 2018-07-03: qty 2

## 2018-07-03 MED ORDER — DOCUSATE SODIUM 100 MG PO CAPS
100.0000 mg | ORAL_CAPSULE | Freq: Two times a day (BID) | ORAL | Status: DC
  Administered 2018-07-03 (×2): 100 mg via ORAL
  Filled 2018-07-03 (×2): qty 1

## 2018-07-03 MED ORDER — PHENOL 1.4 % MT LIQD
1.0000 | OROMUCOSAL | Status: DC | PRN
  Filled 2018-07-03: qty 177

## 2018-07-03 MED ORDER — ICOSAPENT ETHYL 1 G PO CAPS: 2.0000 g | ORAL_CAPSULE | Freq: Two times a day (BID) | ORAL | Status: DC

## 2018-07-03 MED ORDER — ROCURONIUM BROMIDE 100 MG/10ML IV SOLN
INTRAVENOUS | Status: DC | PRN
  Administered 2018-07-03: 60 mg via INTRAVENOUS

## 2018-07-03 MED ORDER — EPHEDRINE SULFATE 50 MG/ML IJ SOLN
INTRAMUSCULAR | Status: DC | PRN
  Administered 2018-07-03: 5 mg via INTRAVENOUS
  Administered 2018-07-03: 7 mg via INTRAVENOUS
  Administered 2018-07-03: 5 mg via INTRAVENOUS

## 2018-07-03 MED ORDER — MEPERIDINE HCL 25 MG/ML IJ SOLN
6.2500 mg | INTRAMUSCULAR | Status: DC | PRN
  Filled 2018-07-03: qty 1

## 2018-07-03 MED ORDER — OXYCODONE HCL 5 MG PO TABS
5.0000 mg | ORAL_TABLET | ORAL | Status: DC | PRN
  Administered 2018-07-04: 5 mg via ORAL
  Filled 2018-07-03: qty 2

## 2018-07-03 MED ORDER — BUPIVACAINE HCL (PF) 0.5 % IJ SOLN
INTRAMUSCULAR | Status: DC | PRN
  Administered 2018-07-03: 15 mL

## 2018-07-03 MED ORDER — ACETAMINOPHEN 325 MG PO TABS
325.0000 mg | ORAL_TABLET | Freq: Four times a day (QID) | ORAL | Status: DC | PRN
  Filled 2018-07-03: qty 2

## 2018-07-03 MED ORDER — FENTANYL CITRATE (PF) 100 MCG/2ML IJ SOLN
INTRAMUSCULAR | Status: AC
  Filled 2018-07-03: qty 2

## 2018-07-03 MED ORDER — LIRAGLUTIDE 18 MG/3ML ~~LOC~~ SOPN: 0.6000 mg | PEN_INJECTOR | SUBCUTANEOUS | Status: DC

## 2018-07-03 MED ORDER — FENTANYL CITRATE (PF) 100 MCG/2ML IJ SOLN
INTRAMUSCULAR | Status: DC | PRN
  Administered 2018-07-03: 50 ug via INTRAVENOUS

## 2018-07-03 MED ORDER — CHLORHEXIDINE GLUCONATE 4 % EX LIQD
60.0000 mL | Freq: Once | CUTANEOUS | Status: DC
  Filled 2018-07-03: qty 118

## 2018-07-03 MED ORDER — INSULIN ASPART 100 UNIT/ML ~~LOC~~ SOLN
0.0000 [IU] | Freq: Three times a day (TID) | SUBCUTANEOUS | Status: DC
  Administered 2018-07-03: 3 [IU] via SUBCUTANEOUS
  Administered 2018-07-04: 08:00:00 2 [IU] via SUBCUTANEOUS
  Filled 2018-07-03: qty 0.15

## 2018-07-03 MED ORDER — PHENYLEPHRINE HCL (PRESSORS) 10 MG/ML IV SOLN
INTRAVENOUS | Status: DC | PRN
  Administered 2018-07-03 (×2): 40 ug via INTRAVENOUS
  Administered 2018-07-03: 80 ug via INTRAVENOUS

## 2018-07-03 MED ORDER — METOCLOPRAMIDE HCL 5 MG/ML IJ SOLN
5.0000 mg | Freq: Three times a day (TID) | INTRAMUSCULAR | Status: DC | PRN
  Filled 2018-07-03: qty 2

## 2018-07-03 MED ORDER — FENTANYL CITRATE (PF) 250 MCG/5ML IJ SOLN
INTRAMUSCULAR | Status: AC
  Filled 2018-07-03: qty 5

## 2018-07-03 SURGICAL SUPPLY — 62 items
AID PSTN UNV HD RSTRNT DISP (MISCELLANEOUS) ×1
APL PRP STRL LF DISP 70% ISPRP (MISCELLANEOUS) ×1
BNDG COHESIVE 4X5 TAN STRL (GAUZE/BANDAGES/DRESSINGS) ×1 IMPLANT
BURR OVAL 8 FLU 4.0X13 (MISCELLANEOUS) IMPLANT
CANNULA 5.75X7 CRYSTAL CLEAR (CANNULA) IMPLANT
CANNULA TWIST IN 8.25X7CM (CANNULA) IMPLANT
CHLORAPREP W/TINT 26 (MISCELLANEOUS) ×2 IMPLANT
COVER WAND RF STERILE (DRAPES) IMPLANT
CUTTER BONE 4.0MM X 13CM (MISCELLANEOUS) IMPLANT
DECANTER SPIKE VIAL GLASS SM (MISCELLANEOUS) IMPLANT
DRAPE INCISE IOBAN 66X45 STRL (DRAPES) ×2 IMPLANT
DRAPE ORTHO SPLIT 87X125 STRL (DRAPES) ×4 IMPLANT
DRAPE SHOULDER BEACH CHAIR (DRAPES) ×2 IMPLANT
DRAPE STERI 35X30 U-POUCH (DRAPES) ×2 IMPLANT
DRAPE SURG 17X23 STRL (DRAPES) ×2 IMPLANT
DRAPE U-SHAPE 47X51 STRL (DRAPES) ×2 IMPLANT
DRAPE U-SHAPE 76X120 STRL (DRAPES) ×4 IMPLANT
DRSG PAD ABDOMINAL 8X10 ST (GAUZE/BANDAGES/DRESSINGS) ×2 IMPLANT
ELECT REM PT RETURN 9FT ADLT (ELECTROSURGICAL) ×2
ELECTRODE REM PT RTRN 9FT ADLT (ELECTROSURGICAL) ×1 IMPLANT
GAUZE SPONGE 4X4 12PLY STRL (GAUZE/BANDAGES/DRESSINGS) ×2 IMPLANT
GAUZE SPONGE 4X4 12PLY STRL LF (GAUZE/BANDAGES/DRESSINGS) ×1 IMPLANT
GAUZE XEROFORM 1X8 LF (GAUZE/BANDAGES/DRESSINGS) ×2 IMPLANT
GLOVE BIO SURGEON STRL SZ7.5 (GLOVE) ×4 IMPLANT
GLOVE BIOGEL PI IND STRL 8 (GLOVE) ×2 IMPLANT
GLOVE BIOGEL PI INDICATOR 8 (GLOVE) ×2
GOWN STRL REUS W/ TWL LRG LVL3 (GOWN DISPOSABLE) ×2 IMPLANT
GOWN STRL REUS W/TWL LRG LVL3 (GOWN DISPOSABLE) ×4
MANIFOLD NEPTUNE II (INSTRUMENTS) ×2 IMPLANT
NDL 1/2 CIR CATGUT .05X1.09 (NEEDLE) IMPLANT
NDL SCORPION MULTI FIRE (NEEDLE) IMPLANT
NDL SPNL 18GX3.5 QUINCKE PK (NEEDLE) IMPLANT
NDL SUT 6 .5 CRC .975X.05 MAYO (NEEDLE) IMPLANT
NEEDLE 1/2 CIR CATGUT .05X1.09 (NEEDLE) IMPLANT
NEEDLE MAYO TAPER (NEEDLE)
NEEDLE SCORPION MULTI FIRE (NEEDLE) IMPLANT
NEEDLE SPNL 18GX3.5 QUINCKE PK (NEEDLE) ×2 IMPLANT
NS IRRIG 1000ML POUR BTL (IV SOLUTION) IMPLANT
PACK ARTHROSCOPY DSU (CUSTOM PROCEDURE TRAY) ×2 IMPLANT
PACK BASIN DAY SURGERY FS (CUSTOM PROCEDURE TRAY) ×2 IMPLANT
PAD ABD 8X10 STRL (GAUZE/BANDAGES/DRESSINGS) ×1 IMPLANT
PORT APPOLLO RF 90DEGREE MULTI (SURGICAL WAND) ×2 IMPLANT
PROBE BIPOLAR ATHRO 135MM 90D (MISCELLANEOUS) ×1 IMPLANT
RESTRAINT HEAD UNIVERSAL NS (MISCELLANEOUS) ×2 IMPLANT
SHEET MEDIUM DRAPE 40X70 STRL (DRAPES) ×1 IMPLANT
SLEEVE SCD COMPRESS KNEE MED (MISCELLANEOUS) ×2 IMPLANT
SLING ARM FOAM STRAP LRG (SOFTGOODS) ×1 IMPLANT
SLING ARM IMMOBILIZER MED (SOFTGOODS) IMPLANT
SLING ARM MED ADULT FOAM STRAP (SOFTGOODS) IMPLANT
SLING ARM XL FOAM STRAP (SOFTGOODS) IMPLANT
STOCKINETTE IMPERVIOUS LG (DRAPES) ×2 IMPLANT
SUPPORT WRAP ARM LG (MISCELLANEOUS) IMPLANT
SUT ETHILON 3 0 PS 1 (SUTURE) ×3 IMPLANT
SUT PDS AB 0 CT 36 (SUTURE) ×1 IMPLANT
SUT TIGER TAPE 7 IN WHITE (SUTURE) IMPLANT
SUTURE TAPE 1.3 40 TPR END (SUTURE) IMPLANT
SUTURETAPE 1.3 40 TPR END (SUTURE) ×4
TAPE CLOTH SURG 6X10 WHT LF (GAUZE/BANDAGES/DRESSINGS) ×1 IMPLANT
TAPE FIBER 2MM 7IN #2 BLUE (SUTURE) IMPLANT
TOWEL OR 17X26 10 PK STRL BLUE (TOWEL DISPOSABLE) ×2 IMPLANT
TUBING ARTHROSCOPY IRRIG 16FT (MISCELLANEOUS) ×2 IMPLANT
TUBING SUCTION 1/4X6FT (MISCELLANEOUS) ×2 IMPLANT

## 2018-07-03 NOTE — Progress Notes (Signed)
This RN received a phone call from medical device rep for patients defibrillator.   He reports that patient has a monitor at home that sends over the readings to his cardiologist. When the patient gets home the defibrillator will then sync back with the monitor and send the readings.  He feels no need at this time to see the patient.

## 2018-07-03 NOTE — Anesthesia Procedure Notes (Signed)
Procedure Name: Intubation Date/Time: 07/03/2018 10:22 AM Performed by: Suan Halter, CRNA Pre-anesthesia Checklist: Patient identified, Emergency Drugs available, Suction available and Patient being monitored Patient Re-evaluated:Patient Re-evaluated prior to induction Oxygen Delivery Method: Circle system utilized Preoxygenation: Pre-oxygenation with 100% oxygen Induction Type: IV induction Ventilation: Mask ventilation without difficulty Laryngoscope Size: Mac and 4 Grade View: Grade I Tube type: Oral Tube size: 7.5 mm Number of attempts: 1 Airway Equipment and Method: Stylet and Oral airway Placement Confirmation: ETT inserted through vocal cords under direct vision,  positive ETCO2 and breath sounds checked- equal and bilateral Secured at: 21 cm Tube secured with: Tape Dental Injury: Teeth and Oropharynx as per pre-operative assessment

## 2018-07-03 NOTE — Anesthesia Postprocedure Evaluation (Signed)
Anesthesia Post Note  Patient: Justin Ray  Procedure(s) Performed: ARTHROSCOPY SHOULDER (Left Shoulder) SHOULDER ARTHROSCOPY WITH SUBACROMIAL DECOMPRESSION  BICEP TENODESIS (Left Shoulder)     Patient location during evaluation: PACU Anesthesia Type: General Level of consciousness: awake and alert Pain management: pain level controlled Vital Signs Assessment: post-procedure vital signs reviewed and stable Respiratory status: spontaneous breathing, nonlabored ventilation, respiratory function stable and patient connected to nasal cannula oxygen Cardiovascular status: blood pressure returned to baseline and stable Postop Assessment: no apparent nausea or vomiting Anesthetic complications: no    Last Vitals:  Vitals:   07/03/18 1145 07/03/18 1200  BP: (!) 176/102   Pulse: 88 83  Resp: 20 (!) 26  Temp:    SpO2: 100% 100%    Last Pain:  Vitals:   07/03/18 1200  TempSrc:   PainSc: 0-No pain                 Montez Hageman

## 2018-07-03 NOTE — Op Note (Signed)
Procedure(s): ARTHROSCOPY SHOULDER SHOULDER ARTHROSCOPY WITH SUBACROMIAL DECOMPRESSION  BICEP TENODESIS Procedure Note  Justin Ray male 53 y.o. 07/03/2018   Preoperative diagnosis: #1 left shoulder proximal biceps root complex injury #2 left shoulder impingement  Postoperative diagnosis: Same Plus partial-thickness tear of the subscapularis  Procedure(s) and Anesthesia Type:  #1 left shoulder arthroscopic biceps tenodesis #2 left shoulder arthroscopic subacromial decompression #3 extensive debridement partial tear subscapularis and superior labral tear  Surgeon(s) and Role:    Tania Ade, MD - Primary     Surgeon: Isabella Stalling   Assistants: Jeanmarie Hubert PA-C (Danielle was present and scrubbed throughout the procedure and was essential in positioning, assisting with the camera and instrumentation,, and closure)  Anesthesia: General endotracheal anesthesia with preoperative interscalene block given by the attending anesthesiologist    Procedure Detail  ARTHROSCOPY SHOULDER, SHOULDER ARTHROSCOPY WITH SUBACROMIAL DECOMPRESSION  BICEP TENODESIS  Estimated Blood Loss: Min         Drains: none  Blood Given: none         Specimens: none        Complications:  * No complications entered in OR log *         Disposition: PACU - hemodynamically stable.         Condition: stable    Procedure:   INDICATIONS FOR SURGERY: The patient is 53 y.o. male who had an injury at work with resultant left shoulder pain.  Failed extensive conservative management.  Indicated for surgical treatment to decrease pain and restore function.  OPERATIVE FINDINGS: Examination under anesthesia: No stiffness or instability    DESCRIPTION OF PROCEDURE: The patient was identified in preoperative  holding area where I personally marked the operative site after  verifying site, side, and procedure with the patient. An interscalene block was given by the attending  anesthesiologist the holding area.  The patient was taken back to the operating room where general anesthesia was induced without complication and was placed in the beach-chair position with the back  elevated about 60 degrees and all extremities and head and neck carefully padded and  positioned.   The left upper extremity was then prepped and  draped in a standard sterile fashion. The appropriate time-out  procedure was carried out. The patient did receive IV antibiotics  within 30 minutes of incision.   A small posterior portal incision was made and the arthroscope was introduced into the joint. An anterior portal was then established above the subscapularis using needle localization. Small cannula was placed anteriorly. Diagnostic arthroscopy was then carried out.  The subscapularis had approximately 10% to 20% partial tearing involving articular side.  No significant detachment of the tendon from the tuberosity.  This was extensively debrided back to healthy bleeding tendon.  No formal repair was felt to be necessary. The superior labrum was noted to be extensively torn and detached from the superior glenoid with involvement of the biceps anchor.  The biceps root itself also was about 40% torn and the biceps tendon was pulled into the joint which was severely inflamed and felt to be part of the pain process.  The decision was made to proceed with a arthroscopic biceps tenodesis.  Using a spinal needle was then PDS to shuttle sutures 2 suture tapes were passed through the biceps tendon in a crossing pattern at the level of the rotator interval.  These were passed percutaneously.  The biceps was then released from the superior labrum which is extensively debrided.  The arthroscope was  then introduced into the subacromial space a standard lateral portal was established with needle localization. The shaver was used through the lateral portal to perform extensive bursectomy. Coracoacromial ligament  was examined and found to be frayed indicating impingement.  The bursal surface of the rotator cuff was examined and noted to have a small area anteriorly which appeared a little bit thin likely due to some impingement but no exposed tuberosity or partial tearing.  At this point a large cannula was placed laterally and the suture tapes were identified anteriorly and sequentially tied over the rotator interval securing the biceps tenodesis.  The coracoacromial ligament was taken down off the anterior acromion with the ArthroCare exposing a moderate Anterior acromial spur. A high-speed bur was then used through the lateral portal to take down the anterior acromial spur from lateral to medial in a standard acromioplasty.  The acromioplasty was also viewed from the lateral portal and the bur was used as necessary to ensure that the acromion was completely flat from posterior to anterior.  The arthroscopic equipment was removed from the joint and the portals were closed with 3-0 nylon in an interrupted fashion. Sterile dressings were then applied including Xeroform 4 x 4's ABDs and tape. The patient was then allowed to awaken from general anesthesia, placed in a sling, transferred to the stretcher and taken to the recovery room in stable condition.   POSTOPERATIVE PLAN: The patient will be discharged home today and will followup in one week for suture removal and wound check.  The patient will be observed overnight and discharged home tomorrow morning.  He will follow the standard proximal biceps tenodesis protocol

## 2018-07-03 NOTE — Anesthesia Procedure Notes (Signed)
Anesthesia Regional Block: Supraclavicular block   Pre-Anesthetic Checklist: ,, timeout performed, Correct Patient, Correct Site, Correct Laterality, Correct Procedure, Correct Position, site marked, Risks and benefits discussed,  Surgical consent,  Pre-op evaluation,  At surgeon's request and post-op pain management  Laterality: Left and Upper  Prep: Maximum Sterile Barrier Precautions used, chloraprep       Needles:  Injection technique: Single-shot  Needle Type: Echogenic Stimulator Needle     Needle Length: 10cm      Additional Needles:   Procedures:,,,, ultrasound used (permanent image in chart),,,,  Narrative:  Start time: 07/03/2018 9:05 AM End time: 07/03/2018 9:15 AM Injection made incrementally with aspirations every 5 mL.  Performed by: Personally  Anesthesiologist: Montez Hageman, MD  Additional Notes: Risks, benefits and alternative to block explained extensively.  Patient tolerated procedure well, without complications.

## 2018-07-03 NOTE — H&P (Signed)
Justin Ray is an 53 y.o. male.   Chief Complaint: Left shoulder pain HPI: Status post left shoulder injury in October 2018 at work.  He went on to have continued pain despite extensive conservative management.  He also was treated for associated spine issues and subsequently return for reevaluation of the shoulder which continued to give him trouble.  He was unable to get an MRI and it was felt that diagnostic arthroscopy with treatment as indicated was the next appropriate step for him.  Risk benefits and alternatives were discussed at length.  Past Medical History:  Diagnosis Date  . AICD (automatic cardioverter/defibrillator) present   . Anxiety   . Cancer ([place])    skin cancer  . Cardiomyopathy   . Carpal tunnel syndrome 10/14   right-GSC  . Cataract   . CHF (congestive heart failure) ([place])   . Depression   . Diabetes mellitus    TYPE 2 , DIAGNOSED AS AN ADULT   . Dyslipidemia   . Hypertension   . ICD (implantable cardioverter-defibrillator) in place   . Pneumonia OVER 10 YEARS AGO   . Sleep apnea    was tested many yr ago-does not use a cpap-  . Wears glasses     Past Surgical History:  Procedure Laterality Date  . ANTERIOR CERVICAL DECOMP/DISCECTOMY FUSION N/A 03/06/2017   Procedure: ANTERIOR CERVICAL DECOMPRESSION FUSION,CERVICAL 6-7 WITH INSTRUMENTATION AND ALLOGRAFT REQUESTED TIME 2.5 HRS;  Surgeon: Phylliss Bob, MD;  Location: [facility];  Service: Orthopedics;  Laterality: N/A;  ANTERIOR CERVICAL DECOMPRESSION FUSION, CERVICAL 6-7 WITH INSTRUMENTATION AND ALLOGRAFT REQUESTED TIME 2.5 HRS  . CARDIAC DEFIBRILLATOR PLACEMENT  02/01/2004  . CARDIAC DEFIBRILLATOR PLACEMENT  11/14   replaced with new one  . CARPAL TUNNEL RELEASE Left 12/19/2012   Procedure: LEFT CARPAL TUNNEL RELEASE;  Surgeon: Roseanne Kaufman, MD;  Location: [facility];  Service: Orthopedics;  Laterality: Left;  . CARPAL TUNNEL RELEASE Right 02/1999  . EYE SURGERY Bilateral    cataract surgery  with lens implants  . IMPLANTABLE CARDIOVERTER DEFIBRILLATOR (ICD) GENERATOR CHANGE N/A 11/21/2012   Procedure: ICD GENERATOR CHANGE;  Surgeon: Evans Lance, MD;  Location: [facility] CATH LAB;  Service: Cardiovascular;  Laterality: N/A;  . TONSILLECTOMY      Family History  Problem Relation Age of Onset  . Coronary artery disease Father        CABG  . Cancer Mother        colon  . Heart disease Brother   . Cancer Other    Social History:  reports that he has been smoking cigarettes. He has been smoking about 1.50 packs per day. He has never used smokeless tobacco. He reports current alcohol use. He reports that he does not use drugs.  Allergies:  Allergies  Allergen Reactions  . Codeine Shortness Of Breath and Other (See Comments)    Severe HA's.  . Niaspan [Niacin Er] Other (See Comments)    Per pt feels like skin is burning( REDNESS), and pins/needles are sticking   . Sulfonamide Derivatives Other (See Comments)    thrush    Medications Prior to Admission  Medication Sig Dispense Refill  . ALPRAZolam (XANAX) 0.5 MG tablet Take 0.5 mg by mouth daily as needed for anxiety.    . ARIPiprazole (ABILIFY) 2 MG tablet Take 2 mg by mouth daily.    Marland Kitchen aspirin 81 MG tablet Take 81 mg by mouth daily.     Marland Kitchen buPROPion (WELLBUTRIN SR) 150 MG 12 hr  tablet Take 150 mg by mouth 2 (two) times daily.    . carvedilol (COREG) 25 MG tablet take 1 tablet by mouth twice a day WITH A MEAL (Patient taking differently: Take 25 mg by mouth 2 (two) times daily with a meal. take 1 tablet by mouth twice a day with A MEAL) 180 tablet 1  . desvenlafaxine (PRISTIQ) 100 MG 24 hr tablet TAKE 1 TABLET BY MOUTH ONCE DAILY. 90 tablet 3  . DIGOX 125 MCG tablet TAKE 1 TABLET BY MOUTH DAILY. (Patient taking differently: Take 0.125 mg by mouth daily. ) 90 tablet 3  . hydrochlorothiazide (HYDRODIURIL) 25 MG tablet Take 25 mg by mouth daily.    . insulin NPH-regular Human (HUMULIN 70/30) (70-30) 100 UNIT/ML injection inject 65  units subcutaneously every morning and inject 50-55 units subcutaneously every evening (Patient taking differently: Inject 55-65 Units into the skin See admin instructions. inject 65 units subcutaneously every morning and inject 55 units subcutaneously every evening) 40 vial 11  . lisinopril (PRINIVIL,ZESTRIL) 20 MG tablet TAKE 1 TABLET BY MOUTH TWICE A DAY. 180 tablet 3  . metFORMIN (GLUCOPHAGE-XR) 500 MG 24 hr tablet Take 1,000 mg by mouth 2 (two) times daily.   0  . nystatin cream (MYCOSTATIN) Apply 1 application topically 2 (two) times daily as needed for dry skin.    Marland Kitchen triamcinolone cream (KENALOG) 0.1 % Apply 1 application topically 2 (two) times daily as needed (drt skin).    . VASCEPA 1 g CAPS TAKE 2 CAPSULES(2 GRAMS) BY MOUTH TWICE DAILY (Patient taking differently: Take 2 g by mouth 2 (two) times a day. ) 120 capsule 11  . VICTOZA 18 MG/3ML SOPN Inject 0.6 mg into the skin every morning.   0  . blood glucose meter kit and supplies KIT Test blood sugar 3 times daily. Dx code: E11.65 1 each 0  . ibuprofen (ADVIL) 200 MG tablet Take 400 mg by mouth every 6 (six) hours as needed (pain /  headache).      Results for orders placed or performed during the hospital encounter of 07/03/18 (from the past 48 hour(s))  Glucose, capillary     Status: None   Collection Time: 07/03/18  8:12 AM  Result Value Ref Range   Glucose-Capillary 99 70 - 99 mg/dL   No results found.  ROS  Blood pressure (!) 142/82, pulse 87, temperature 97.9 F (36.6 C), temperature source Oral, resp. rate 16, height '5\' 8"'$  (1.727 m), weight 86.2 kg, SpO2 97 %. Physical Exam   Assessment/Plan Status post left shoulder injury in October 2018 at work.  He went on to have continued pain despite extensive conservative management.  He also was treated for associated spine issues and subsequently return for reevaluation of the shoulder which continued to give him trouble.  He was unable to get an MRI and it was felt that  diagnostic arthroscopy with treatment as indicated was the next appropriate step for him.  Risk benefits and alternatives were discussed at length.  Risks / benefits of surgery discussed Consent on chart  NPO for OR Preop antibiotics   Isabella Stalling, MD 07/03/2018, 9:19 AM

## 2018-07-03 NOTE — Progress Notes (Signed)
Assisted Dr. Marcell Barlow with left, ultrasound guided, supraclavicular block. Side rails up, monitors on throughout procedure. See vital signs in flow sheet. Tolerated Procedure well.

## 2018-07-03 NOTE — Transfer of Care (Signed)
Immediate Anesthesia Transfer of Care Note  Patient: Justin Ray  Procedure(s) Performed: ARTHROSCOPY SHOULDER (Left Shoulder) SHOULDER ARTHROSCOPY WITH SUBACROMIAL DECOMPRESSION  BICEP TENODESIS (Left Shoulder)  Patient Location: PACU  Anesthesia Type:General  Level of Consciousness: awake, alert , oriented and patient cooperative  Airway & Oxygen Therapy: Patient Spontanous Breathing and Patient connected to face mask oxygen  Post-op Assessment: Report given to RN and Post -op Vital signs reviewed and stable  Post vital signs: Reviewed and stable  Last Vitals:  Vitals Value Taken Time  BP 121/63 07/03/18 1138  Temp 36.4 C 07/03/18 1136  Pulse 94 07/03/18 1145  Resp 17 07/03/18 1145  SpO2 100 % 07/03/18 1145  Vitals shown include unvalidated device data.  Last Pain:  Vitals:   07/03/18 0751  TempSrc: Oral      Patients Stated Pain Goal: 3 (97/58/83 2549)  Complications: No apparent anesthesia complications

## 2018-07-04 DIAGNOSIS — S43432A Superior glenoid labrum lesion of left shoulder, initial encounter: Secondary | ICD-10-CM | POA: Diagnosis not present

## 2018-07-04 LAB — GLUCOSE, CAPILLARY: Glucose-Capillary: 125 mg/dL — ABNORMAL HIGH (ref 70–99)

## 2018-07-04 MED ORDER — ACETAMINOPHEN 500 MG PO TABS
ORAL_TABLET | ORAL | Status: AC
  Filled 2018-07-04: qty 2

## 2018-07-04 MED ORDER — OXYCODONE-ACETAMINOPHEN 5-325 MG PO TABS: 1.0000 | ORAL_TABLET | ORAL | 0 refills | Status: DC | PRN

## 2018-07-04 MED ORDER — TIZANIDINE HCL 4 MG PO TABS: 4.0000 mg | ORAL_TABLET | Freq: Three times a day (TID) | ORAL | 1 refills | Status: DC

## 2018-07-04 MED ORDER — OXYCODONE HCL 5 MG PO TABS
ORAL_TABLET | ORAL | Status: AC
  Filled 2018-07-04: qty 1

## 2018-07-04 MED ORDER — INSULIN ASPART 100 UNIT/ML ~~LOC~~ SOLN
SUBCUTANEOUS | Status: AC
  Filled 2018-07-04: qty 1

## 2018-07-04 NOTE — Discharge Instructions (Signed)
Discharge Instructions after Arthroscopic Shoulder Repair   A sling has been provided for you. Remain in your sling at all times. This includes sleeping in your sling.  Use ice on the shoulder intermittently over the first 48 hours after surgery.  Pain medicine has been prescribed for you.  Use your medicine liberally over the first 48 hours, and then you can begin to taper your use. You may take Extra Strength Tylenol or Tylenol only in place of the pain pills. DO NOT take ANY nonsteroidal anti-inflammatory pain medications: Advil, Motrin, Ibuprofen, Aleve, Naproxen, or Narprosyn.  You may remove your dressing after two days. If the incision sites are still moist, place a Band-Aid over the moist site(s). Change Band-Aids daily until dry.  You may shower 5 days after surgery. The incisions CANNOT get wet prior to 5 days. Simply allow the water to wash over the site and then pat dry. Do not rub the incisions. Make sure your axilla (armpit) is completely dry after showering.  Take one aspirin a day for 2 weeks after surgery, unless you have an aspirin sensitivity/ allergy or asthma.   Please call [phone] during normal business hours or [phone] after hours for any problems. Including the following:  - excessive redness of the incisions - drainage for more than 4 days - fever of more than 101.5 F  *Please note that pain medications will not be refilled after hours or on weekends.  Regional Anesthesia Blocks  1. Numbness or the inability to move the "blocked" extremity may last from 3-48 hours after placement. The length of time depends on the medication injected and your individual response to the medication. If the numbness is not going away after 48 hours, call your surgeon.  2. The extremity that is blocked will need to be protected until the numbness is gone and the  Strength has returned. Because you cannot feel it, you will need to take extra care to avoid injury. Because it may  be weak, you may have difficulty moving it or using it. You may not know what position it is in without looking at it while the block is in effect.  3. For blocks in the legs and feet, returning to weight bearing and walking needs to be done carefully. You will need to wait until the numbness is entirely gone and the strength has returned. You should be able to move your leg and foot normally before you try and bear weight or walk. You will need someone to be with you when you first try to ensure you do not fall and possibly risk injury.  4. Bruising and tenderness at the needle site are common side effects and will resolve in a few days.  5. Persistent numbness or new problems with movement should be communicated to the surgeon or the [place] [phone] [place] [phone]).

## 2018-07-04 NOTE — Discharge Summary (Signed)
Patient ID: Justin Ray MRN: [id number] DOB/AGE: 1965/05/26 53 y.o.  Admit date: 07/03/2018 Discharge date: 07/04/2018  Admission Diagnoses:  Active Problems:   S/P arthroscopy of left shoulder   Discharge Diagnoses:  Same  Past Medical History:  Diagnosis Date  . AICD (automatic cardioverter/defibrillator) present   . Anxiety   . Cancer ([place])    skin cancer  . Cardiomyopathy   . Carpal tunnel syndrome 10/14   right-GSC  . Cataract   . CHF (congestive heart failure) ([place])   . Depression   . Diabetes mellitus    TYPE 2 , DIAGNOSED AS AN ADULT   . Dyslipidemia   . Hypertension   . ICD (implantable cardioverter-defibrillator) in place   . Pneumonia OVER 10 YEARS AGO   . Sleep apnea    was tested many yr ago-does not use a cpap-  . Wears glasses     Surgeries: Procedure(s): ARTHROSCOPY SHOULDER SHOULDER ARTHROSCOPY WITH SUBACROMIAL DECOMPRESSION  BICEP TENODESIS on 07/03/2018   Consultants:   Discharged Condition: Improved  Hospital Course: Justin Ray is an 53 y.o. male who was admitted 07/03/2018 for operative treatment of left shoulder pain. Patient has severe unremitting pain that affects sleep, daily activities, and work/hobbies. After pre-op clearance the patient was taken to the operating room on 07/03/2018 and underwent  Procedure(s): ARTHROSCOPY SHOULDER SHOULDER ARTHROSCOPY WITH SUBACROMIAL DECOMPRESSION  BICEP TENODESIS.    Patient was given perioperative antibiotics:  Anti-infectives (From admission, onward)   Start     Dose/Rate Route Frequency Ordered Stop   07/03/18 0800  ceFAZolin (ANCEF) IVPB 2g/100 mL premix     2 g 200 mL/hr over 30 Minutes Intravenous On call to O.R. 07/03/18 0751 07/03/18 1010       Patient was given sequential compression devices, early ambulation, and asa to prevent DVT.  Patient benefited maximally from hospital stay and there were no complications.    Recent vital signs:  Patient Vitals for the past 24 hrs:  BP  Temp Temp src Pulse Resp SpO2 Height Weight  07/04/18 0725 125/81 - - 83 14 94 % - -  07/04/18 0513 - - - 87 - - - -  07/04/18 0459 (!) 144/85 98.2 F (36.8 C) - 85 18 96 % - -  07/04/18 0230 118/73 98.2 F (36.8 C) - 87 18 96 % - -  07/03/18 2134 118/76 98.2 F (36.8 C) - 89 20 94 % - -  07/03/18 1729 122/71 98.4 F (36.9 C) - 87 (!) 22 92 % - -  07/03/18 1645 132/80 98.2 F (36.8 C) - 95 - 94 % - -  07/03/18 1540 125/78 - - - - - - -  07/03/18 1530 125/78 97.8 F (36.6 C) - 87 20 94 % - -  07/03/18 1430 117/78 97.9 F (36.6 C) - 83 14 93 % - -  07/03/18 1330 (!) 130/91 97.7 F (36.5 C) - 85 20 94 % - -  07/03/18 1315 113/70 - - 86 (!) 23 93 % - -  07/03/18 1300 112/69 - - 82 17 97 % - -  07/03/18 1245 - - - 89 11 92 % - -  07/03/18 1230 127/83 - - 85 16 92 % - -  07/03/18 1215 - - - 85 (!) 21 99 % - -  07/03/18 1200 (!) 145/88 - - 83 (!) 26 100 % - -  07/03/18 1145 (!) 176/102 - - 88 20 100 % - -  07/03/18 1136 121/63 Marland Kitchen)  97.5 F (36.4 C) - 86 (!) 22 98 % - -  07/03/18 1010 - - - 86 16 99 % - -  07/03/18 1005 126/73 - - 86 (!) 21 99 % - -  07/03/18 1000 124/69 - - 94 (!) 22 100 % - -  07/03/18 0955 117/63 - - 90 (!) 21 98 % - -  07/03/18 0950 114/68 - - 87 (!) 21 99 % - -  07/03/18 0751 (!) 142/82 97.9 F (36.6 C) Oral 87 16 97 % '5\' 8"'$  (1.727 m) 86.2 kg     Recent laboratory studies: No results for input(s): WBC, HGB, HCT, PLT, NA, K, CL, CO2, BUN, CREATININE, GLUCOSE, INR, CALCIUM in the last 72 hours.  Invalid input(s): PT, 2   Discharge Medications:   Allergies as of 07/04/2018      Reactions   Codeine Shortness Of Breath, Other (See Comments)   Severe HA's.   Niaspan [niacin Er] Other (See Comments)   Per pt feels like skin is burning( REDNESS), and pins/needles are sticking    Sulfonamide Derivatives Other (See Comments)   thrush      Medication List    TAKE these medications   ALPRAZolam 0.5 MG tablet Commonly known as: XANAX Take 0.5 mg by mouth  daily as needed for anxiety.   ARIPiprazole 2 MG tablet Commonly known as: ABILIFY Take 2 mg by mouth daily.   aspirin 81 MG tablet Take 81 mg by mouth daily.   blood glucose meter kit and supplies Kit Test blood sugar 3 times daily. Dx code: E11.65   buPROPion 150 MG 12 hr tablet Commonly known as: WELLBUTRIN SR Take 150 mg by mouth 2 (two) times daily.   carvedilol 25 MG tablet Commonly known as: COREG take 1 tablet by mouth twice a day WITH A MEAL What changed: See the new instructions.   desvenlafaxine 100 MG 24 hr tablet Commonly known as: Pristiq TAKE 1 TABLET BY MOUTH ONCE DAILY.   Digox 0.125 MG tablet Generic drug: digoxin TAKE 1 TABLET BY MOUTH DAILY. What changed: how much to take   hydrochlorothiazide 25 MG tablet Commonly known as: HYDRODIURIL Take 25 mg by mouth daily.   ibuprofen 200 MG tablet Commonly known as: ADVIL Take 400 mg by mouth every 6 (six) hours as needed (pain /  headache).   insulin NPH-regular Human (70-30) 100 UNIT/ML injection Commonly known as: HumuLIN 70/30 inject 65 units subcutaneously every morning and inject 50-55 units subcutaneously every evening What changed:   how much to take  how to take this  when to take this  additional instructions Notes to patient: Novalog sliding scale given at 7:20 -2 units   lisinopril 20 MG tablet Commonly known as: ZESTRIL TAKE 1 TABLET BY MOUTH TWICE A DAY.   metFORMIN 500 MG 24 hr tablet Commonly known as: GLUCOPHAGE-XR Take 1,000 mg by mouth 2 (two) times daily.   nystatin cream Commonly known as: MYCOSTATIN Apply 1 application topically 2 (two) times daily as needed for dry skin.   oxyCODONE-acetaminophen 5-325 MG tablet Commonly known as: Percocet Take 1 tablet by mouth every 4 (four) hours as needed for severe pain.   tiZANidine 4 MG tablet Commonly known as: Zanaflex Take 1 tablet (4 mg total) by mouth 3 (three) times daily.   triamcinolone cream 0.1 % Commonly known  as: KENALOG Apply 1 application topically 2 (two) times daily as needed (drt skin).   Vascepa 1 g Caps Generic drug: Icosapent Ethyl  TAKE 2 CAPSULES(2 GRAMS) BY MOUTH TWICE DAILY What changed: See the new instructions.   Victoza 18 MG/3ML Sopn Generic drug: liraglutide Inject 0.6 mg into the skin every morning.       Diagnostic Studies: No results found.  Disposition: Discharge disposition: 01-Home or Self Care       Discharge Instructions    Call MD / Call 911   Complete by: As directed    If you experience chest pain or shortness of breath, CALL 911 and be transported to the hospital emergency room.  If you develope a fever above 101 F, pus (white drainage) or increased drainage or redness at the wound, or calf pain, call your surgeon's office.   Constipation Prevention   Complete by: As directed    Drink plenty of fluids.  Prune juice may be helpful.  You may use a stool softener, such as Colace (over the counter) 100 mg twice a day.  Use MiraLax (over the counter) for constipation as needed.   Diet - low sodium heart healthy   Complete by: As directed    Increase activity slowly as tolerated   Complete by: As directed       Follow-up Information    Tania Ade, MD. Schedule an appointment as soon as possible for a visit in 1 week.   Specialty: Orthopedic Surgery Contact information: [address] [place] [address] [phone]            Signed: Grier Mitts 07/04/2018, 7:43 AM

## 2018-07-07 ENCOUNTER — Encounter (HOSPITAL_BASED_OUTPATIENT_CLINIC_OR_DEPARTMENT_OTHER): Payer: Self-pay | Admitting: Orthopedic Surgery

## 2018-07-11 NOTE — Telephone Encounter (Signed)
Probably atrial fib but do we have a duration. If less than 10 minutes, no treatment. GT

## 2018-09-10 ENCOUNTER — Other Ambulatory Visit: Payer: Self-pay | Admitting: Internal Medicine

## 2018-09-23 ENCOUNTER — Ambulatory Visit (INDEPENDENT_AMBULATORY_CARE_PROVIDER_SITE_OTHER): Payer: 59 | Admitting: *Deleted

## 2018-09-23 DIAGNOSIS — I428 Other cardiomyopathies: Secondary | ICD-10-CM

## 2018-09-24 LAB — CUP PACEART REMOTE DEVICE CHECK
Battery Remaining Longevity: 78 mo
Battery Remaining Percentage: 82 %
Brady Statistic RV Percent Paced: 0 %
Date Time Interrogation Session: 20200909035900
HighPow Impedance: 67 Ohm
Implantable Lead Implant Date: 20060117
Implantable Lead Location: 753860
Implantable Lead Model: 185
Implantable Lead Serial Number: 124785
Implantable Pulse Generator Implant Date: 20141107
Lead Channel Impedance Value: 562 Ohm
Lead Channel Pacing Threshold Amplitude: 1.4 V
Lead Channel Pacing Threshold Pulse Width: 0.8 ms
Lead Channel Setting Pacing Amplitude: 2.8 V
Lead Channel Setting Pacing Pulse Width: 0.8 ms
Lead Channel Setting Sensing Sensitivity: 0.6 mV
Pulse Gen Serial Number: 126350

## 2018-10-08 ENCOUNTER — Encounter: Payer: Self-pay | Admitting: Cardiology

## 2018-10-08 NOTE — Progress Notes (Signed)
Remote ICD transmission.   

## 2018-10-14 ENCOUNTER — Encounter: Payer: Self-pay | Admitting: Internal Medicine

## 2018-10-14 ENCOUNTER — Ambulatory Visit (INDEPENDENT_AMBULATORY_CARE_PROVIDER_SITE_OTHER): Payer: 59 | Admitting: Internal Medicine

## 2018-10-14 ENCOUNTER — Other Ambulatory Visit: Payer: Self-pay

## 2018-10-14 VITALS — BP 126/72 | HR 85 | Ht 68.0 in | Wt 183.0 lb

## 2018-10-14 DIAGNOSIS — I5022 Chronic systolic (congestive) heart failure: Secondary | ICD-10-CM

## 2018-10-14 DIAGNOSIS — Z95810 Presence of automatic (implantable) cardiac defibrillator: Secondary | ICD-10-CM | POA: Diagnosis not present

## 2018-10-14 NOTE — Patient Instructions (Signed)
Medication Instructions:  Your physician recommends that you continue on your current medications as directed. Please refer to the Current Medication list given to you today.  Labwork: None ordered.  Testing/Procedures: None ordered.  Follow-Up: Your physician wants you to follow-up in: one year with Dr. Lovena Le.  You will receive a reminder letter in the mail two months in advance. If you don't receive a letter, please call our office to schedule the follow-up appointment.  Remote monitoring is used to monitor your ICD from home. This monitoring reduces the number of office visits required to check your device to one time per year. It allows [place] to keep an eye on the functioning of your device to ensure it is working properly. You are scheduled for a device check from home on 12/23/2018. You may send your transmission at any time that day. If you have a wireless device, the transmission will be sent automatically. After your physician reviews your transmission, you will receive a postcard with your next transmission date.  Any Other Special Instructions Will Be Listed Below (If Applicable).  If you need a refill on your cardiac medications before your next appointment, please call your pharmacy.

## 2018-10-14 NOTE — Progress Notes (Signed)
HPI Justin Ray returns today for followup. He is a pleasant 53 yo man with a h/o a non-ischemic CM, s/p ICD insertion, and HTN. He has done well in the interim. He has occaisional palpitations. Allergies  Allergen Reactions  . Codeine Shortness Of Breath and Other (See Comments)    Severe HA's.  . Niaspan [Niacin Er] Other (See Comments)    Per pt feels like skin is burning( REDNESS), and pins/needles are sticking   . Sulfonamide Derivatives Other (See Comments)    thrush     Current Outpatient Medications  Medication Sig Dispense Refill  . ALPRAZolam (XANAX) 0.5 MG tablet Take 0.5 mg by mouth daily as needed for anxiety.    . ARIPiprazole (ABILIFY) 2 MG tablet Take 2 mg by mouth daily.    Marland Kitchen aspirin 81 MG tablet Take 81 mg by mouth daily.     . blood glucose meter kit and supplies KIT Test blood sugar 3 times daily. Dx code: E11.65 1 each 0  . buPROPion (WELLBUTRIN SR) 150 MG 12 hr tablet Take 150 mg by mouth 2 (two) times daily.    Marland Kitchen buPROPion (ZYBAN) 150 MG 12 hr tablet Take 1 tablet by mouth as directed.    . carvedilol (COREG) 25 MG tablet take 1 tablet by mouth twice a day WITH A MEAL (Patient taking differently: Take 25 mg by mouth 2 (two) times daily with a meal. take 1 tablet by mouth twice a day with A MEAL) 180 tablet 1  . desvenlafaxine (PRISTIQ) 100 MG 24 hr tablet TAKE 1 TABLET BY MOUTH ONCE DAILY. 90 tablet 3  . DIGOX 125 MCG tablet TAKE 1 TABLET BY MOUTH DAILY. (Patient taking differently: Take 0.125 mg by mouth daily. ) 90 tablet 3  . hydrochlorothiazide (HYDRODIURIL) 25 MG tablet Take 25 mg by mouth daily.    Marland Kitchen ibuprofen (ADVIL) 200 MG tablet Take 400 mg by mouth every 6 (six) hours as needed (pain /  headache).    . insulin NPH-regular Human (HUMULIN 70/30) (70-30) 100 UNIT/ML injection inject 65 units subcutaneously every morning and inject 50-55 units subcutaneously every evening (Patient taking differently: Inject 55-65 Units into the skin See admin  instructions. inject 65 units subcutaneously every morning and inject 55 units subcutaneously every evening) 40 vial 11  . lisinopril (ZESTRIL) 20 MG tablet Take 1 tablet (20 mg total) by mouth 2 (two) times daily. Please keep upcoming appt with Dr. Lovena Le in September for future refills. Thank you 180 tablet 0  . metFORMIN (GLUCOPHAGE-XR) 500 MG 24 hr tablet Take 1,000 mg by mouth 2 (two) times daily.   0  . nystatin cream (MYCOSTATIN) Apply 1 application topically 2 (two) times daily as needed for dry skin.    Marland Kitchen oxyCODONE-acetaminophen (PERCOCET) 5-325 MG tablet Take 1 tablet by mouth every 4 (four) hours as needed for severe pain. 20 tablet 0  . tiZANidine (ZANAFLEX) 4 MG tablet Take 1 tablet (4 mg total) by mouth 3 (three) times daily. 30 tablet 1  . triamcinolone cream (KENALOG) 0.1 % Apply 1 application topically 2 (two) times daily as needed (drt skin).    . VASCEPA 1 g CAPS TAKE 2 CAPSULES(2 GRAMS) BY MOUTH TWICE DAILY (Patient taking differently: Take 2 g by mouth 2 (two) times a day. ) 120 capsule 11  . VICTOZA 18 MG/3ML SOPN Inject 0.6 mg into the skin every morning.   0   No current facility-administered medications for this visit.  Past Medical History:  Diagnosis Date  . AICD (automatic cardioverter/defibrillator) present   . Anxiety   . Cancer ([place])    skin cancer  . Cardiomyopathy   . Carpal tunnel syndrome 10/14   right-GSC  . Cataract   . CHF (congestive heart failure) ([place])   . Depression   . Diabetes mellitus    TYPE 2 , DIAGNOSED AS AN ADULT   . Dyslipidemia   . Hypertension   . ICD (implantable cardioverter-defibrillator) in place   . Pneumonia OVER 10 YEARS AGO   . Sleep apnea    was tested many yr ago-does not use a cpap-  . Wears glasses     ROS:   All systems reviewed and negative except as noted in the HPI.   Past Surgical History:  Procedure Laterality Date  . ANTERIOR CERVICAL DECOMP/DISCECTOMY FUSION N/A 03/06/2017   Procedure: ANTERIOR  CERVICAL DECOMPRESSION FUSION,CERVICAL 6-7 WITH INSTRUMENTATION AND ALLOGRAFT REQUESTED TIME 2.5 HRS;  Surgeon: Phylliss Bob, MD;  Location: [facility];  Service: Orthopedics;  Laterality: N/A;  ANTERIOR CERVICAL DECOMPRESSION FUSION, CERVICAL 6-7 WITH INSTRUMENTATION AND ALLOGRAFT REQUESTED TIME 2.5 HRS  . CARDIAC DEFIBRILLATOR PLACEMENT  02/01/2004  . CARDIAC DEFIBRILLATOR PLACEMENT  11/14   replaced with new one  . CARPAL TUNNEL RELEASE Left 12/19/2012   Procedure: LEFT CARPAL TUNNEL RELEASE;  Surgeon: Roseanne Kaufman, MD;  Location: [facility];  Service: Orthopedics;  Laterality: Left;  . CARPAL TUNNEL RELEASE Right 02/1999  . EYE SURGERY Bilateral    cataract surgery with lens implants  . IMPLANTABLE CARDIOVERTER DEFIBRILLATOR (ICD) GENERATOR CHANGE N/A 11/21/2012   Procedure: ICD GENERATOR CHANGE;  Surgeon: Evans Lance, MD;  Location[facility] CATH LAB;  Service: Cardiovascular;  Laterality: N/A;  . SHOULDER ARTHROSCOPY Left 07/03/2018   Procedure: ARTHROSCOPY SHOULDER;  Surgeon: Tania Ade, MD;  Location: [facility];  Service: Orthopedics;  Laterality: Left;  . SHOULDER ARTHROSCOPY WITH SUBACROMIAL DECOMPRESSION AND BICEP TENDON REPAIR Left 07/03/2018   Procedure: SHOULDER ARTHROSCOPY WITH SUBACROMIAL DECOMPRESSION  BICEP TENODESIS;  Surgeon: Tania Ade, MD;  Location: [facility];  Service: Orthopedics;  Laterality: Left;  . TONSILLECTOMY       Family History  Problem Relation Age of Onset  . Coronary artery disease Father        CABG  . Cancer Mother        colon  . Heart disease Brother   . Cancer Other      Social History   Socioeconomic History  . Marital status: Widowed    Spouse name: Not on file  . Number of children: 0  . Years of education: Not on file  . Highest education level: Not on file  Occupational History  . Occupation: maintenance    Employer: UNEMPLOYED  Social Needs  . Financial resource strain: Not on  file  . Food insecurity    Worry: Not on file    Inability: Not on file  . Transportation needs    Medical: Not on file    Non-medical: Not on file  Tobacco Use  . Smoking status: Current Every Day Smoker    Packs/day: 1.50    Types: Cigarettes  . Smokeless tobacco: Never Used  Substance and Sexual Activity  . Alcohol use: Yes    Comment: A SIX PACK EVERY SIX MONTHS   . Drug use: No  . Sexual activity: Never    Comment: number of sex partners in the last 1 months  0  Lifestyle  . Physical activity    Days per week: Not on file    Minutes per session: Not on file  . Stress: Not on file  Relationships  . Social Herbalist on phone: Not on file    Gets together: Not on file    Attends religious service: Not on file    Active member of club or organization: Not on file    Attends meetings of clubs or organizations: Not on file    Relationship status: Not on file  . Intimate partner violence    Fear of current or ex partner: Not on file    Emotionally abused: Not on file    Physically abused: Not on file    Forced sexual activity: Not on file  Other Topics Concern  . Not on file  Social History Narrative  . Not on file     BP 126/72   Pulse 85   Ht '5\' 8"'$  (1.727 m)   Wt 183 lb (83 kg)   SpO2 95%   BMI 27.83 kg/m   Physical Exam:  Well appearing NAD HEENT: Unremarkable Neck:  No JVD, no thyromegally Lymphatics:  No adenopathy Back:  No CVA tenderness Lungs:  Clear with no wheezes HEART:  Regular rate rhythm, no murmurs, no rubs, no clicks Abd:  soft, positive bowel sounds, no organomegally, no rebound, no guarding Ext:  2 plus pulses, no edema, no cyanosis, no clubbing Skin:  No rashes no nodules Neuro:  CN II through XII intact, motor grossly intact  DEVICE  Normal device function.  See PaceArt for details.   Assess/Plan: 1. Chronic systolic heart failure - his symptoms are now class 1. He will continue his current meds. 2. ICD - his [place]  Sci single chamber ICD is working normally.  3. HTN - his bp is well controlled. He will continue his current meds.  Justin Ray.D.

## 2018-10-18 ENCOUNTER — Other Ambulatory Visit: Payer: Self-pay | Admitting: Internal Medicine

## 2018-12-06 ENCOUNTER — Other Ambulatory Visit: Payer: Self-pay | Admitting: Internal Medicine

## 2018-12-23 ENCOUNTER — Ambulatory Visit (INDEPENDENT_AMBULATORY_CARE_PROVIDER_SITE_OTHER): Payer: 59 | Admitting: *Deleted

## 2018-12-23 DIAGNOSIS — I5022 Chronic systolic (congestive) heart failure: Secondary | ICD-10-CM | POA: Diagnosis not present

## 2018-12-23 LAB — CUP PACEART REMOTE DEVICE CHECK
Battery Remaining Longevity: 78 mo
Battery Remaining Percentage: 82 %
Brady Statistic RV Percent Paced: 0 %
Date Time Interrogation Session: 20201208123000
HighPow Impedance: 67 Ohm
Implantable Lead Implant Date: 20060117
Implantable Lead Location: 753860
Implantable Lead Model: 185
Implantable Lead Serial Number: 124785
Implantable Pulse Generator Implant Date: 20141107
Lead Channel Impedance Value: 546 Ohm
Lead Channel Pacing Threshold Amplitude: 1.4 V
Lead Channel Pacing Threshold Pulse Width: 0.8 ms
Lead Channel Setting Pacing Amplitude: 2.8 V
Lead Channel Setting Pacing Pulse Width: 0.8 ms
Lead Channel Setting Sensing Sensitivity: 0.6 mV
Pulse Gen Serial Number: 126350

## 2018-12-29 ENCOUNTER — Encounter: Payer: Self-pay | Admitting: Cardiovascular Disease

## 2018-12-29 ENCOUNTER — Other Ambulatory Visit: Payer: Self-pay

## 2018-12-29 ENCOUNTER — Ambulatory Visit: Payer: 59 | Admitting: Cardiovascular Disease

## 2018-12-29 VITALS — BP 138/80 | HR 59 | Ht 68.0 in | Wt 187.0 lb

## 2018-12-29 DIAGNOSIS — I10 Essential (primary) hypertension: Secondary | ICD-10-CM | POA: Diagnosis not present

## 2018-12-29 DIAGNOSIS — Z95810 Presence of automatic (implantable) cardiac defibrillator: Secondary | ICD-10-CM | POA: Diagnosis not present

## 2018-12-29 DIAGNOSIS — I428 Other cardiomyopathies: Secondary | ICD-10-CM | POA: Diagnosis not present

## 2018-12-29 DIAGNOSIS — E782 Mixed hyperlipidemia: Secondary | ICD-10-CM | POA: Diagnosis not present

## 2018-12-29 DIAGNOSIS — R002 Palpitations: Secondary | ICD-10-CM

## 2018-12-29 MED ORDER — FENOFIBRATE 160 MG PO TABS: 160.0000 mg | ORAL_TABLET | Freq: Every day | ORAL | Status: DC

## 2018-12-29 NOTE — Patient Instructions (Signed)
Follow-Up: IN 12 months Please call our office 2 months in advance, OCT 2021 to schedule this [place] 2021 appointment. In Person [name], MD.    Medication Instructions:  The current medical regimen is effective;  continue present plan and medications as directed. Please refer to the Current Medication list given to you today. If you need a refill on your cardiac medications before your next appointment, please call your pharmacy.  At [facility], you and your health needs are our priority.  As part of our continuing mission to provide you with exceptional heart care, we have created designated Provider Care Teams.  These Care Teams include your primary Cardiologist (physician) and Advanced Practice Providers (APPs -  Physician Assistants and Nurse Practitioners) who all work together to provide you with the care you need, when you need it.  Thank you for choosing [name] at [facility]!!        Happy Holidays!!

## 2018-12-29 NOTE — Progress Notes (Signed)
Patient ID: Justin Ray, male   DOB: September 22, 1965, 53 y.o.   MRN: [id number]     Primary MD: Dr. Tami Lin  PATIENT PROFILE: Justin Ray is a 53 y.o. male who was referred through the courtesy of Dr. Laney Pastor for cardiology care with me.  The patient had been previously followed in [place] for his cardiology care has also seen Dr. Lovena Le who follows him from an electrophysiologic standpoint with his history of cardiomyopathy and ICD implantation.  I last saw him in January 2019.      He presents for follow-up evaluation.    HPI:  [name] has history of chronic systolic heart failure, class II and is felt to have a nonischemic cardiomyopathy.  He underwent insertion of an ICD in January 2006 and in November 2014 underwent ICD generator change by Dr.Taylor.  He has a history of anxiety/depression, type 2 diabetes mellitus on insulin, hyperlipidemia, and has continued have difficulty with elevated triglycerides.  He had been followed by the cardiology group in [place], but has developed recent discontent with his physician and is requested a new cardiology evaluation.  The patient has been on lisinopril 20 mg twice a day, digoxin 0.125 mg and carvedilol 25 mg twice a day for his coronary myopathy.  Hypertension.  He has been on Crestor 20 mg for hyperlipidemia.  An earlier this year he was started on placebo 2 capsules twice a day due to persistent elevated triglyceride levels.  His last lipid study 2 months ago showed a total cholesterol of 73, triglycerides 225, HDL 27, and EDL 45.  I suspect his LDL is inaccurate and this was recorded as 1.  He has a history of continued tobacco use, and started smoking at age 63 and currently is trying to quit but is still smoking.  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  He does not routinely exercise.  In 2004 he apparently underwent a sleep study and was told of having obstructive sleep apnea but  was never started on CPAP therapy.  Presently, he goes to bed about 1 AM and wakes up at 8 AM.  He often is up at least 2-4 times per night.  His sleep is nonrestorative.  He snores loudly.  He does note daytime sleepiness.  He is diabetic on insulin.  He has been taking Xanax as needed for anxiety.    Since I initially saw him in April 2017.  He underwent a sleep study at the [facility].  This was done on 06/29/2015.  This study was consistent with upper airway resistance syndrome with an AHI of 2.6 per hour.  His mean oxygen saturation was 88.47% with a minimum oxygen desaturation to 85%.  There was evidence for soft snoring.  Of note, he was never able to achieve any supine sleep.  He presents for reevaluation.  An Epworth Sleepiness Scale score was calculated today in the office and this endorsed at 9 with a moderate chance of dozing sitting and reading, watching TV, and while lying down to rest in the afternoon and circumstances persist.  He had a slight chance of dozing dozing while sitting inactive in a public place, as a passenger in a car, and sitting quietly after lunch.    I saw him in May 2018 and last saw him in January 2019.  He hasdfelt well from a cardiovascular standpoint and denies any chest pain or shortness of breath.  He saw  Dr. Lovena Le in June 2018 and he was doing well.  Systolic heart failure symptoms had improved and were now class I.  He has not had any recurrent ICD therapies but does have evidence for nonsustained ventricular tachycardia.  He has been undergoing physical therapy for shoulder and neck.  He underwent a follow-up echo Doppler study in October 2018, which now showed significant improvement in LV function with an EF of 50-55%.  Left atrium was mildly dilated.    Since his February 02, 2017 evaluation, he has continued to be stable.  He specifically denies chest pain or shortness of breath.  He notes a very rare isolated palpitation which lasts only  seconds.  He is sleeping well.  He is followed closely by Dr. Gorden Harms who checks his laboratory.  Hemoglobin A1c had improved to 6.6 on December 25, 2018.  There is no change in exercise tolerance.  He had recent laboratory in June 2020 which showed a total cholesterol 169, HDL 27, and triglycerides were 572.  He has been on Vascepa 2 capsules twice a day.  He continues to be on carvedilol 25 mg twice a day, digoxin 0.125 mg daily, HCTZ 25 mg in addition to lisinopril 20 mg daily for his blood pressure and prior heart failure.  He is diabetic on metformin and Victoza.  He presents for evaluation.    Past Medical History:  Diagnosis Date  . AICD (automatic cardioverter/defibrillator) present   . Anxiety   . Cancer ([place])    skin cancer  . Cardiomyopathy   . Carpal tunnel syndrome 10/14   right-GSC  . Cataract   . CHF (congestive heart failure) ([place])   . Depression   . Diabetes mellitus    TYPE 2 , DIAGNOSED AS AN ADULT   . Dyslipidemia   . Hypertension   . ICD (implantable cardioverter-defibrillator) in place   . Pneumonia OVER 10 YEARS AGO   . Sleep apnea    was tested many yr ago-does not use a cpap-  . Wears glasses     Past Surgical History:  Procedure Laterality Date  . ANTERIOR CERVICAL DECOMP/DISCECTOMY FUSION N/A 03/06/2017   Procedure: ANTERIOR CERVICAL DECOMPRESSION FUSION,CERVICAL 6-7 WITH INSTRUMENTATION AND ALLOGRAFT REQUESTED TIME 2.5 HRS;  Surgeon: Phylliss Bob, MD;  Location: [facility];  Service: Orthopedics;  Laterality: N/A;  ANTERIOR CERVICAL DECOMPRESSION FUSION, CERVICAL 6-7 WITH INSTRUMENTATION AND ALLOGRAFT REQUESTED TIME 2.5 HRS  . CARDIAC DEFIBRILLATOR PLACEMENT  02/01/2004  . CARDIAC DEFIBRILLATOR PLACEMENT  11/14   replaced with new one  . CARPAL TUNNEL RELEASE Left 12/19/2012   Procedure: LEFT CARPAL TUNNEL RELEASE;  Surgeon: Roseanne Kaufman, MD;  Location: [facility];  Service: Orthopedics;  Laterality: Left;  . CARPAL TUNNEL RELEASE Right  02/1999  . EYE SURGERY Bilateral    cataract surgery with lens implants  . IMPLANTABLE CARDIOVERTER DEFIBRILLATOR (ICD) GENERATOR CHANGE N/A 11/21/2012   Procedure: ICD GENERATOR CHANGE;  Surgeon: Evans Lance, MD;  Location: [facility] CATH LAB;  Service: Cardiovascular;  Laterality: N/A;  . SHOULDER ARTHROSCOPY Left 07/03/2018   Procedure: ARTHROSCOPY SHOULDER;  Surgeon: Tania Ade, MD;  Location: [facility];  Service: Orthopedics;  Laterality: Left;  . SHOULDER ARTHROSCOPY WITH SUBACROMIAL DECOMPRESSION AND BICEP TENDON REPAIR Left 07/03/2018   Procedure: SHOULDER ARTHROSCOPY WITH SUBACROMIAL DECOMPRESSION  BICEP TENODESIS;  Surgeon: Tania Ade, MD;  Location: [facility];  Service: Orthopedics;  Laterality: Left;  . TONSILLECTOMY      Allergies  Allergen  Reactions  . Codeine Shortness Of Breath and Other (See Comments)    Severe HA's.  . Niaspan [Niacin Er] Other (See Comments)    Per pt feels like skin is burning( REDNESS), and pins/needles are sticking   . Sulfonamide Derivatives Other (See Comments)    thrush    Current Outpatient Medications  Medication Sig Dispense Refill  . ARIPiprazole (ABILIFY) 2 MG tablet Take 2 mg by mouth daily.    Marland Kitchen aspirin 81 MG tablet Take 81 mg by mouth daily.     . blood glucose meter kit and supplies KIT Test blood sugar 3 times daily. Dx code: E11.65 1 each 0  . carvedilol (COREG) 25 MG tablet take 1 tablet by mouth twice a day WITH A MEAL (Patient taking differently: Take 25 mg by mouth 2 (two) times daily with a meal. take 1 tablet by mouth twice a day with A MEAL) 180 tablet 1  . clonazePAM (KLONOPIN) 0.5 MG tablet Take 0.5 mg by mouth 2 (two) times daily as needed.    . desvenlafaxine (PRISTIQ) 100 MG 24 hr tablet TAKE 1 TABLET BY MOUTH ONCE DAILY. 90 tablet 3  . digoxin (LANOXIN) 0.125 MG tablet TAKE 1 TABLET BY MOUTH DAILY. 90 tablet 3  . hydrochlorothiazide (HYDRODIURIL) 25 MG tablet Take 25 mg by mouth  daily.    Marland Kitchen ibuprofen (ADVIL) 200 MG tablet Take 400 mg by mouth every 6 (six) hours as needed (pain /  headache).    . insulin NPH-regular Human (HUMULIN 70/30) (70-30) 100 UNIT/ML injection inject 65 units subcutaneously every morning and inject 50-55 units subcutaneously every evening (Patient taking differently: Inject 55-65 Units into the skin See admin instructions. inject 65 units subcutaneously every morning and inject 55 units subcutaneously every evening) 40 vial 11  . lisinopril (ZESTRIL) 20 MG tablet TAKE 1 TABLET BY MOUTH TWICE DAILY 180 tablet 2  . metFORMIN (GLUCOPHAGE-XR) 500 MG 24 hr tablet Take 1,000 mg by mouth 2 (two) times daily.   0  . tiZANidine (ZANAFLEX) 4 MG tablet Take 1 tablet (4 mg total) by mouth 3 (three) times daily. 30 tablet 1  . VASCEPA 1 g CAPS TAKE 2 CAPSULES(2 GRAMS) BY MOUTH TWICE DAILY (Patient taking differently: Take 2 g by mouth 2 (two) times a day. ) 120 capsule 11  . VICTOZA 18 MG/3ML SOPN Inject 0.6 mg into the skin every morning.   0  . fenofibrate 160 MG tablet Take 1 tablet (160 mg total) by mouth daily.     No current facility-administered medications for this visit.    Social History   Socioeconomic History  . Marital status: Widowed    Spouse name: Not on file  . Number of children: 0  . Years of education: Not on file  . Highest education level: Not on file  Occupational History  . Occupation: maintenance    Employer: UNEMPLOYED  Tobacco Use  . Smoking status: Current Every Day Smoker    Packs/day: 1.50    Types: Cigarettes  . Smokeless tobacco: Never Used  Substance and Sexual Activity  . Alcohol use: Yes    Comment: A SIX PACK EVERY SIX MONTHS   . Drug use: No  . Sexual activity: Never    Comment: number of sex partners in the last 12 months  0  Other Topics Concern  . Not on file  Social History Narrative  . Not on file   Social Determinants of Health   Financial Resource Strain:   .  Difficulty of Paying Living  Expenses: Not on file  Food Insecurity:   . Worried About Charity fundraiser in the Last Year: Not on file  . Ran Out of Food in the Last Year: Not on file  Transportation Needs:   . Lack of Transportation (Medical): Not on file  . Lack of Transportation (Non-Medical): Not on file  Physical Activity:   . Days of Exercise per Week: Not on file  . Minutes of Exercise per Session: Not on file  Stress:   . Feeling of Stress : Not on file  Social Connections:   . Frequency of Communication with Friends and Family: Not on file  . Frequency of Social Gatherings with Friends and Family: Not on file  . Attends Religious Services: Not on file  . Active Member of Clubs or Organizations: Not on file  . Attends Archivist Meetings: Not on file  . Marital Status: Not on file  Intimate Partner Violence:   . Fear of Current or Ex-Partner: Not on file  . Emotionally Abused: Not on file  . Physically Abused: Not on file  . Sexually Abused: Not on file   Additional social history is notable that he is widowed for 9 years.  He lives by himself.  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  He completed 12th grade of education.  He continues to smoke cigarettes and has been smoking since age 62.  He does drink occasional beer.  He walks intermittently outside work.  Family History  Problem Relation Age of Onset  . Coronary artery disease Father        CABG  . Cancer Mother        colon  . Heart disease Brother   . Cancer Other    Additional family history is that his mother died at 52 secondary to cancer.  His father is deceased and had undergone CABG revascularization surgery.  His 2 brothers, ages 4 and 35.  ROS General: Negative; No fevers, chills, or night sweats HEENT: Negative; No changes in vision or hearing, sinus congestion, difficulty swallowing Pulmonary: Negative; No cough, wheezing, shortness of breath, hemoptysis Cardiovascular:  See HPI; No chest  pain, presyncope, syncope, palpitations, edema GI: Negative; No nausea, vomiting, diarrhea, or abdominal pain GU: Negative; No dysuria, hematuria, or difficulty voiding Musculoskeletal: Negative; no myalgias, joint pain, or weakness Hematologic/Oncologic: Negative; no easy bruising, bleeding Endocrine: Negative; no heat/cold intolerance; no diabetes Neuro: Negative; no changes in balance, headaches Skin: Negative; No rashes or skin lesions Psychiatric: Positive for anxiety and depression Sleep: Positive for previous poor sleep which has significantly improved.  Snoring; No  bruxism, restless legs, hypnogagnic hallucinations Other comprehensive 14 point system review is negative   Physical Exam BP 138/80   Pulse (!) 59   Ht _0  (1.727 m)   Wt 187 lb (84.8 kg)   SpO2 95%   BMI 28.43 kg/m    Repeat blood pressure by me 130/80  Wt Readings from Last 3 Encounters:  12/29/18 187 lb (84.8 kg)  10/14/18 183 lb (83 kg)  07/03/18 190 lb 1.6 oz (86.2 kg)   General: Alert, oriented, no distress.  Skin: normal turgor, no rashes, warm and dry HEENT: Normocephalic, atraumatic. Pupils equal round and reactive to light; sclera anicteric; extraocular muscles intact;  Nose without nasal septal hypertrophy Mouth/Parynx benign; Mallinpatti scale 3 Neck: No JVD, no carotid bruits; normal carotid upstroke Lungs: clear to ausculatation and percussion; no wheezing or  rales Chest wall: without tenderness to palpitation Heart: PMI not displaced, RRR, s1 s2 normal, 1/6 systolic murmur, no diastolic murmur, no rubs, gallops, thrills, or heaves Abdomen: soft, nontender; no hepatosplenomehaly, BS+; abdominal aorta nontender and not dilated by palpation. Back: no CVA tenderness Pulses 2+ Musculoskeletal: full range of motion, normal strength, no joint deformities Extremities: no clubbing cyanosis or edema, Homan's sign negative  Neurologic: grossly nonfocal; Cranial nerves grossly wnl Psychologic:  Normal mood and affect   ECG (independently read by me): Normal sinus rhythm at 89 bpm.  No ectopy.  Normal intervals.  January 2019 ECG (independently read by me): Normal sinus rhythm at 90 bpm.  Borderline LVH.  No ectopy.  Normal intervals.  May 2018 ECG (independently read by me):normal sinus rhythm at 69 bpm.  Normal intervals.  No ectopy.  April 2017 ECG (independently read by me): Normal sinus rhythm at 73 bpm.  Borderline voltage criteria for LVH.  LABS:  BMP Latest Ref Rng & Units 06/27/2018 03/05/2017 10/23/2016  Glucose 70 - 99 mg/dL 121(H) 149(H) 107(H)  BUN 6 - 20 mg/dL _0 Creatinine 0.61 - 1.24 mg/dL 1.01 0.86 0.82  Sodium 135 - 145 mmol/L 136 140 137  Potassium 3.5 - 5.1 mmol/L 4.9 4.6 3.8  Chloride 98 - 111 mmol/L 105 107 107  CO2 22 - 32 mmol/L _1 Calcium 8.9 - 10.3 mg/dL 8.9 9.1 8.6(L)     Hepatic Function Latest Ref Rng & Units 03/05/2017 10/23/2016 05/24/2015  Total Protein 6.5 - 8.1 g/dL 6.9 7.3 6.9  Albumin 3.5 - 5.0 g/dL 4.0 3.9 4.3  AST 15 - 41 U/L 32 30 31  ALT 17 - 63 U/L 43 37 42  Alk Phosphatase 38 - 126 U/L 63 73 59  Total Bilirubin 0.3 - 1.2 mg/dL 0.6 0.5 0.7    CBC Latest Ref Rng & Units 06/27/2018 03/05/2017 10/23/2016  WBC 4.0 - 10.5 K/uL 7.1 8.5 8.5  Hemoglobin 13.0 - 17.0 g/dL 17.7(H) 17.5(H) 16.6  Hematocrit 39.0 - 52.0 % 54.2(H) 50.7 47.9  Platelets 150 - 400 K/uL 175 151 177   Lab Results  Component Value Date   MCV 93.6 06/27/2018   MCV 91.0 03/05/2017   MCV 89.4 10/23/2016   Lab Results  Component Value Date   TSH 0.800 09/05/2011   Lab Results  Component Value Date   HGBA1C 6.7 (H) 03/05/2017     BNP    Component Value Date/Time   BNP 11.9 05/24/2015 1036    ProBNP No results found for: PROBNP   Lipid Panel     Component Value Date/Time   CHOL 83 (L) 05/06/2015 1723   TRIG 181 (H) 05/06/2015 1723   HDL 32 (L) 05/06/2015 1723   CHOLHDL 2.6 05/06/2015 1723   CHOLHDL 2.7 02/11/2015 0958   VLDL 45 (H)  02/11/2015 0958   LDLCALC 15 05/06/2015 1723    RADIOLOGY: CUP PACEART REMOTE DEVICE CHECK  Result Date: 12/23/2018 Scheduled remote reviewed.  Normal device function.  3 NSVT, longest lasting 16 seconds Next remote 91 days.  [place]   IMPRESSION:  1. Essential hypertension   2. NICM (nonischemic cardiomyopathy) ([place])   3. Automatic implantable cardioverter-defibrillator in situ   4. Hyperlipidemia, mixed   5. Palpitations    ASSESSMENT AND PLAN: Mr. Abdulloh Ullom is a 53 year old gentleman who has a long-standing history of tobacco use. He underwent initial defibrillator insertion in 6579 for systolic heart failure and in 2014 underwent an ICD  upgrade generator change.  An echo Doppler study was done by [facility] in 2015 showed an ejection fraction at a ~ 45% with LVH and grade 1 diastolic dysfunction.  His  echo Doppler study from October 2018 now showed significant improvement in  EF at 50-55%.  He has not had any defibrillator discharges. Blood pressure today is stable on his medical regimen now consisting of carvedilol 25 mg twice a day, HCTZ 25 mg daily, lisinopril 20 mg twice a day.  He continues to be on digoxin in light of his prior cardiomyopathy.  I reviewed most recent lab studies by Dr. Vassie Loll [facility] which showed significant triglyceride elevation at 572 despite taking Vascepa 2 capsules daily.  I am adding fenofibrate 160 mg to his regimen.  Recent hemoglobin A1c had improved with significant change in diet to 6.6 from previous elevation.  Remotely, and that we had discussed the possibility of transitioning him to [place], but with his class I symptoms and normalization of LV function this is not indicated.  His most recent sleep study showed probable increased upper airway resistance without definitive gnosis of sleep apnea; however, supine sleep was not present on that study.  He believes he is sleeping well and that his sleep has improved.  Progressive symptoms develop  in the future repeat evaluation may be indicated.  He will be undergoing follow-up laboratory with his primary physician.  I will see him in 1 year for reevaluation.  Time spent: 25 minutes Troy Sine, MD, [facility] 12/30/2018 7:02 PM

## 2018-12-30 ENCOUNTER — Encounter: Payer: Self-pay | Admitting: Cardiovascular Disease

## 2019-01-22 LAB — CUP PACEART INCLINIC DEVICE CHECK
Date Time Interrogation Session: 20200929171333
Implantable Lead Implant Date: 20060117
Implantable Lead Location: 753860
Implantable Lead Model: 185
Implantable Lead Serial Number: 124785
Implantable Pulse Generator Implant Date: 20141107
Pulse Gen Serial Number: 126350

## 2019-01-23 NOTE — Progress Notes (Signed)
ICD remote 

## 2019-03-24 ENCOUNTER — Ambulatory Visit (INDEPENDENT_AMBULATORY_CARE_PROVIDER_SITE_OTHER): Payer: 59 | Admitting: *Deleted

## 2019-03-24 DIAGNOSIS — I5022 Chronic systolic (congestive) heart failure: Secondary | ICD-10-CM

## 2019-03-25 LAB — CUP PACEART REMOTE DEVICE CHECK
Battery Remaining Longevity: 72 mo
Battery Remaining Percentage: 76 %
Brady Statistic RV Percent Paced: 0 %
Date Time Interrogation Session: 20210310000000
HighPow Impedance: 69 Ohm
Implantable Lead Implant Date: 20060117
Implantable Lead Location: 753860
Implantable Lead Model: 185
Implantable Lead Serial Number: 124785
Implantable Pulse Generator Implant Date: 20141107
Lead Channel Impedance Value: 549 Ohm
Lead Channel Pacing Threshold Amplitude: 1.4 V
Lead Channel Pacing Threshold Pulse Width: 0.8 ms
Lead Channel Setting Pacing Amplitude: 2.8 V
Lead Channel Setting Pacing Pulse Width: 0.8 ms
Lead Channel Setting Sensing Sensitivity: 0.6 mV
Pulse Gen Serial Number: 126350

## 2019-03-25 NOTE — Progress Notes (Signed)
ICD Remote  

## 2019-04-11 ENCOUNTER — Other Ambulatory Visit: Payer: Self-pay | Admitting: Cardiovascular Disease

## 2019-04-16 IMAGING — RF DG CERVICAL SPINE 2 OR 3 VIEWS
1 series · 2 of 2 positions shown · non-contrast
Comparison: 05/21/2012

CLINICAL DATA: ACDF C6-7

EXAM:
DG C-ARM 61-120 MIN; CERVICAL SPINE - 2-3 VIEW

[Series 1: run · 2 of 2 slices shown]
[im 1/2]
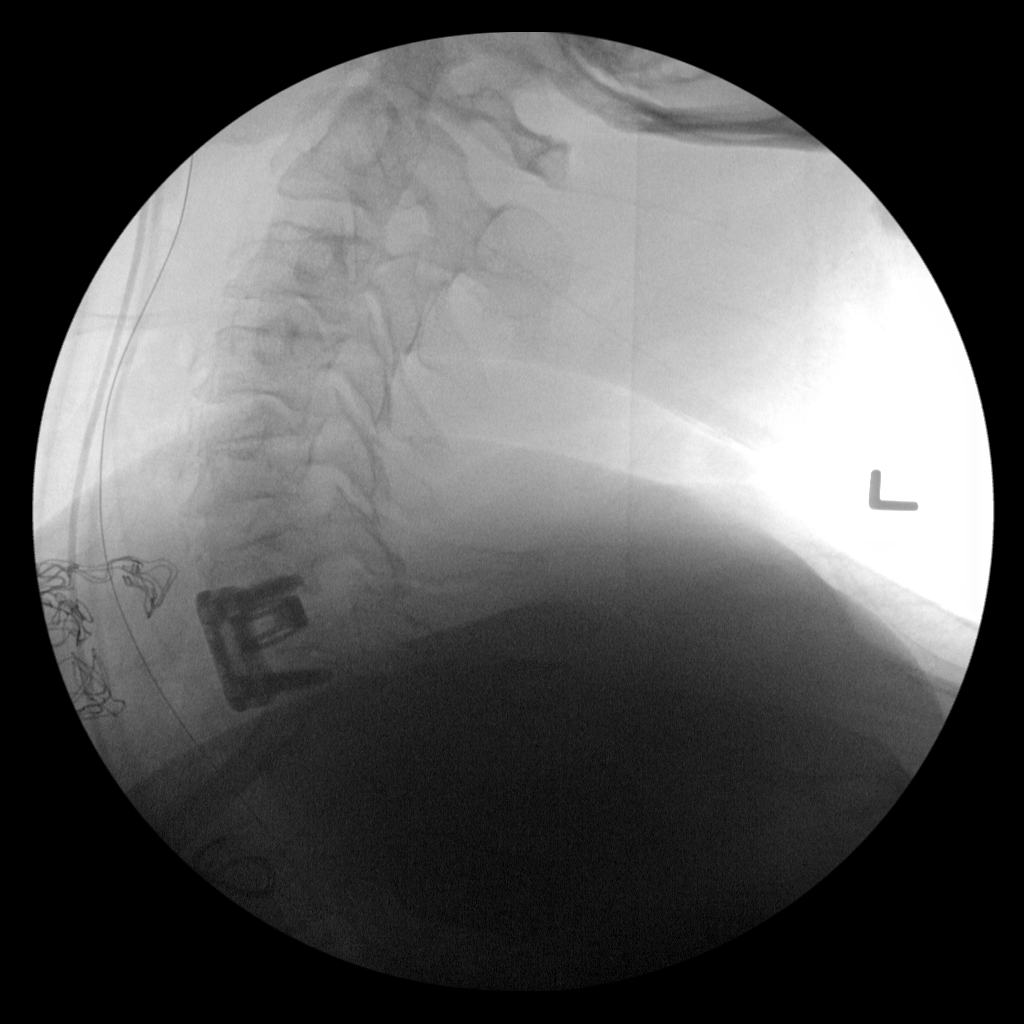
[im 2/2]
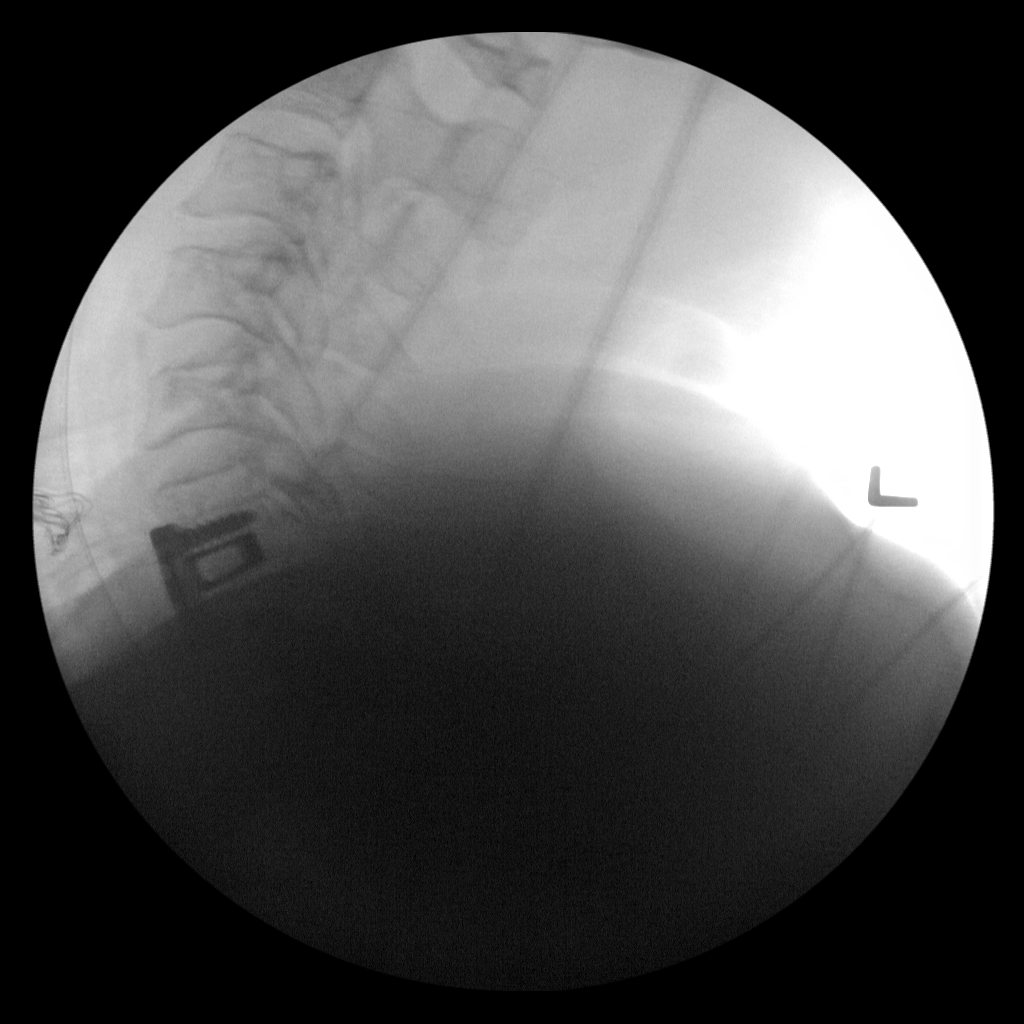

[2 of 2 positions shown; findings below may reference images not displayed]

FINDINGS: Intraoperative spot images demonstrate changes of anterior fusion at
C6-7. The C7 vertebral body is difficult to visualize due to
overlying shoulders. No visible hardware complicating feature.
IMPRESSION: ACDF C6-7. No visible complicating feature. C7 not well visualized
due to overlying shoulders.

## 2019-06-23 ENCOUNTER — Ambulatory Visit (INDEPENDENT_AMBULATORY_CARE_PROVIDER_SITE_OTHER): Payer: 59 | Admitting: *Deleted

## 2019-06-23 DIAGNOSIS — I428 Other cardiomyopathies: Secondary | ICD-10-CM

## 2019-06-24 ENCOUNTER — Telehealth: Payer: Self-pay

## 2019-06-24 LAB — CUP PACEART REMOTE DEVICE CHECK
Battery Remaining Longevity: 72 mo
Battery Remaining Percentage: 75 %
Brady Statistic RV Percent Paced: 0 %
Date Time Interrogation Session: 20210609115400
HighPow Impedance: 66 Ohm
Implantable Lead Implant Date: 20060117
Implantable Lead Location: 753860
Implantable Lead Model: 185
Implantable Lead Serial Number: 124785
Implantable Pulse Generator Implant Date: 20141107
Lead Channel Impedance Value: 506 Ohm
Lead Channel Pacing Threshold Amplitude: 1.4 V
Lead Channel Pacing Threshold Pulse Width: 0.8 ms
Lead Channel Setting Pacing Amplitude: 2.8 V
Lead Channel Setting Pacing Pulse Width: 0.8 ms
Lead Channel Setting Sensing Sensitivity: 0.6 mV
Pulse Gen Serial Number: 126350

## 2019-06-24 NOTE — Telephone Encounter (Signed)
Left message for patient to remind of missed remote transmission.  

## 2019-06-25 NOTE — Progress Notes (Signed)
Remote ICD transmission.   

## 2019-08-29 ENCOUNTER — Other Ambulatory Visit: Payer: Self-pay | Admitting: Internal Medicine

## 2019-09-14 ENCOUNTER — Encounter: Payer: Self-pay | Admitting: Cardiovascular Disease

## 2019-09-14 ENCOUNTER — Ambulatory Visit: Payer: 59 | Admitting: Cardiovascular Disease

## 2019-09-14 ENCOUNTER — Other Ambulatory Visit: Payer: Self-pay

## 2019-09-14 VITALS — BP 142/82 | HR 73 | Ht 68.0 in | Wt 187.0 lb

## 2019-09-14 DIAGNOSIS — I428 Other cardiomyopathies: Secondary | ICD-10-CM

## 2019-09-14 DIAGNOSIS — E782 Mixed hyperlipidemia: Secondary | ICD-10-CM

## 2019-09-14 DIAGNOSIS — G4733 Obstructive sleep apnea (adult) (pediatric): Secondary | ICD-10-CM

## 2019-09-14 DIAGNOSIS — Z95810 Presence of automatic (implantable) cardiac defibrillator: Secondary | ICD-10-CM

## 2019-09-14 DIAGNOSIS — I5022 Chronic systolic (congestive) heart failure: Secondary | ICD-10-CM

## 2019-09-14 DIAGNOSIS — I10 Essential (primary) hypertension: Secondary | ICD-10-CM

## 2019-09-14 MED ORDER — FUROSEMIDE 20 MG PO TABS: 20.0000 mg | ORAL_TABLET | Freq: Every day | ORAL | 3 refills | Status: DC

## 2019-09-14 NOTE — Progress Notes (Signed)
Patient ID: Justin Ray, male   DOB: [birth year], 54 y.o.   MRN: [id number]     Primary MD: Dr. Leonia Reader  PATIENT PROFILE: Justin Ray is a 54 y.o. male who was referred through the courtesy of Dr. Merla Riches for cardiology care with me.  The patient had been previously followed in [place] for his cardiology care has also seen Dr. Ladona Ridgel who follows him from an electrophysiologic standpoint with his history of cardiomyopathy and ICD implantation.  I last saw him in December 2020.  He presents for follow-up evaluation.      HPI:  Justin Ray has history of chronic systolic heart failure, class II and is felt to have a nonischemic cardiomyopathy.  He underwent insertion of an ICD in January 2006 and in November 2014 underwent ICD generator change by Dr.Taylor.  He has a history of anxiety/depression, type 2 diabetes mellitus on insulin, hyperlipidemia, and has continued have difficulty with elevated triglycerides.  He had been followed by the cardiology group in [place], but has developed recent discontent with his physician and is requested a new cardiology evaluation.  The patient has been on lisinopril 20 mg twice a day, digoxin 0.125 mg and carvedilol 25 mg twice a day for his coronary myopathy.  Hypertension.  He has been on Crestor 20 mg for hyperlipidemia.  An earlier this year he was started on placebo 2 capsules twice a day due to persistent elevated triglyceride levels.  His last lipid study 2 months ago showed a total cholesterol of 73, triglycerides 225, HDL 27, and EDL 45.  I suspect his LDL is inaccurate and this was recorded as 1.  He has a history of continued tobacco use, and started smoking at age 75 and currently is trying to quit but is still smoking.  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  He does not routinely exercise.  In 2004 he apparently underwent a sleep study and was told of having obstructive sleep apnea but was never  started on CPAP therapy.  Presently, he goes to bed about 1 AM and wakes up at 8 AM.  He often is up at least 2-4 times per night.  His sleep is nonrestorative.  He snores loudly.  He does note daytime sleepiness.  He is diabetic on insulin.  He has been taking Xanax as needed for anxiety.    Since I initially saw him in April 2017.  He underwent a sleep study at the [facility] on 06/29/2015.  This study was consistent with upper airway resistance syndrome with an AHI of 2.6 per hour.  His mean oxygen saturation was 88.47% with a minimum oxygen desaturation to 85%.  There was evidence for soft snoring.  Of note, he was never able to achieve any supine sleep.  He presents for reevaluation.  An Epworth Sleepiness Scale score was calculated today in the office and this endorsed at 9 with a moderate chance of dozing sitting and reading, watching TV, and while lying down to rest in the afternoon and circumstances persist.  He had a slight chance of dozing dozing while sitting inactive in a public place, as a passenger in a car, and sitting quietly after lunch.    I saw him in May 2018 and last saw him in January 2019.  He hasdfelt well from a cardiovascular standpoint and denies any chest pain or shortness of breath.  He saw Dr. Ladona Ridgel in June 2018 and  he was doing well.  Systolic heart failure symptoms had improved and were now class I.  He has not had any recurrent ICD therapies but does have evidence for nonsustained ventricular tachycardia.  He has been undergoing physical therapy for shoulder and neck.  He underwent a follow-up echo Doppler study in October 2018, which now showed significant improvement in LV function with an EF of 50-55%.  Left atrium was mildly dilated.    Since his February 02, 2017 evaluation, he has continued to be stable.    I last saw him in December 2020.  At that time he denied any chest pain or shortness of breath and admitted to a rare isolated palpitation lasting  only seconds.  He was sleeping well.  He is followed closely by Dr. Mellody Memos who checks his laboratory.  Hemoglobin A1c had improved to 6.6 on December 25, 2018.  There is no change in exercise tolerance.  He had recent laboratory in June 2020 which showed a total cholesterol 169, HDL 27, and triglycerides were 572.  He has been on Vascepa 2 capsules twice a day.  He continues to be on carvedilol 25 mg twice a day, digoxin 0.125 mg daily, HCTZ 25 mg in addition to lisinopril 20 mg daily for his blood pressure and prior heart failure.  He is diabetic on metformin and Victoza.    Since his last evaluation, he has continued to be stable with reference to chest pain.  At times he does admit to leg swelling despite HCTZ 25 mg daily. Triglycerides in March 2021 were 569. Apparently insurance denied Vascepa.  He is not sleeping as well as he had in the past and admits to nocturia 4-5 times per night, snoring, frequent awakenings, awakening gasping for breath as well as daytime sleepiness.  He presents for evaluation.   Past Medical History:  Diagnosis Date  . AICD (automatic cardioverter/defibrillator) present   . Anxiety   . Cancer ([place])    skin cancer  . Cardiomyopathy   . Carpal tunnel syndrome 10/14   right-GSC  . Cataract   . CHF (congestive heart failure) ([place])   . Depression   . Diabetes mellitus    TYPE 2 , DIAGNOSED AS AN ADULT   . Dyslipidemia   . Hypertension   . ICD (implantable cardioverter-defibrillator) in place   . Pneumonia OVER 10 YEARS AGO   . Sleep apnea    was tested many yr ago-does not use a cpap-  . Wears glasses     Past Surgical History:  Procedure Laterality Date  . ANTERIOR CERVICAL DECOMP/DISCECTOMY FUSION N/A 03/06/2017   Procedure: ANTERIOR CERVICAL DECOMPRESSION FUSION,CERVICAL 6-7 WITH INSTRUMENTATION AND ALLOGRAFT REQUESTED TIME 2.5 HRS;  Surgeon: Phylliss Bob, MD;  Location: [facility];  Service: Orthopedics;  Laterality: N/A;  ANTERIOR CERVICAL DECOMPRESSION  FUSION, CERVICAL 6-7 WITH INSTRUMENTATION AND ALLOGRAFT REQUESTED TIME 2.5 HRS  . CARDIAC DEFIBRILLATOR PLACEMENT  02/01/2004  . CARDIAC DEFIBRILLATOR PLACEMENT  11/14   replaced with new one  . CARPAL TUNNEL RELEASE Left 12/19/2012   Procedure: LEFT CARPAL TUNNEL RELEASE;  Surgeon: Roseanne Kaufman, MD;  Location: [facility];  Service: Orthopedics;  Laterality: Left;  . CARPAL TUNNEL RELEASE Right 02/1999  . EYE SURGERY Bilateral    cataract surgery with lens implants  . IMPLANTABLE CARDIOVERTER DEFIBRILLATOR (ICD) GENERATOR CHANGE N/A 11/21/2012   Procedure: ICD GENERATOR CHANGE;  Surgeon: Evans Lance, MD;  Location: [facility] CATH LAB;  Service: Cardiovascular;  Laterality: N/A;  .  SHOULDER ARTHROSCOPY Left 07/03/2018   Procedure: ARTHROSCOPY SHOULDER;  Surgeon: Tania Ade, MD;  Location: [facility];  Service: Orthopedics;  Laterality: Left;  . SHOULDER ARTHROSCOPY WITH SUBACROMIAL DECOMPRESSION AND BICEP TENDON REPAIR Left 07/03/2018   Procedure: SHOULDER ARTHROSCOPY WITH SUBACROMIAL DECOMPRESSION  BICEP TENODESIS;  Surgeon: Tania Ade, MD;  Location: [facility];  Service: Orthopedics;  Laterality: Left;  . TONSILLECTOMY      Allergies  Allergen Reactions  . Codeine Shortness Of Breath and Other (See Comments)    Severe HA's.  . Niaspan [Niacin Er] Other (See Comments)    Per pt feels like skin is burning( REDNESS), and pins/needles are sticking   . Sulfonamide Derivatives Other (See Comments)    thrush    Current Outpatient Medications  Medication Sig Dispense Refill  . ARIPiprazole (ABILIFY) 2 MG tablet Take 2 mg by mouth daily.    Marland Kitchen aspirin 81 MG tablet Take 81 mg by mouth daily.     . blood glucose meter kit and supplies KIT Test blood sugar 3 times daily. Dx code: E11.65 1 each 0  . carvedilol (COREG) 25 MG tablet take 1 tablet by mouth twice a day WITH A MEAL (Patient taking differently: Take 25 mg by mouth 2 (two) times daily  with a meal. take 1 tablet by mouth twice a day with A MEAL) 180 tablet 1  . clonazePAM (KLONOPIN) 0.5 MG tablet Take 0.5 mg by mouth 2 (two) times daily as needed.    . desvenlafaxine (PRISTIQ) 100 MG 24 hr tablet TAKE 1 TABLET BY MOUTH ONCE DAILY. 90 tablet 3  . digoxin (LANOXIN) 0.125 MG tablet TAKE 1 TABLET BY MOUTH DAILY. 90 tablet 3  . fenofibrate 160 MG tablet Take 1 tablet (160 mg total) by mouth daily.    . hydrochlorothiazide (HYDRODIURIL) 25 MG tablet Take 25 mg by mouth daily.    Marland Kitchen ibuprofen (ADVIL) 200 MG tablet Take 400 mg by mouth every 6 (six) hours as needed (pain /  headache).    . insulin NPH-regular Human (HUMULIN 70/30) (70-30) 100 UNIT/ML injection inject 65 units subcutaneously every morning and inject 50-55 units subcutaneously every evening (Patient taking differently: Inject 55-65 Units into the skin See admin instructions. inject 65 units subcutaneously every morning and inject 55 units subcutaneously every evening) 40 vial 11  . lisinopril (ZESTRIL) 20 MG tablet TAKE 1 TABLET BY MOUTH TWICE DAILY 180 tablet 2  . metFORMIN (GLUCOPHAGE-XR) 500 MG 24 hr tablet Take 1,000 mg by mouth 2 (two) times daily.   0  . tiZANidine (ZANAFLEX) 4 MG tablet Take 1 tablet (4 mg total) by mouth 3 (three) times daily. 30 tablet 1  . VICTOZA 18 MG/3ML SOPN Inject 0.6 mg into the skin every morning.   0  . furosemide (LASIX) 20 MG tablet Take 1 tablet (20 mg total) by mouth daily. 90 tablet 3   No current facility-administered medications for this visit.    Social History   Socioeconomic History  . Marital status: Widowed    Spouse name: Not on file  . Number of children: 0  . Years of education: Not on file  . Highest education level: Not on file  Occupational History  . Occupation: maintenance    Employer: UNEMPLOYED  Tobacco Use  . Smoking status: Current Every Day Smoker    Packs/day: 1.50    Types: Cigarettes  . Smokeless tobacco: Never Used  Vaping Use  . Vaping Use:  Never used  Substance and Sexual Activity  . Alcohol use: Yes    Comment: A SIX PACK EVERY SIX MONTHS   . Drug use: No  . Sexual activity: Never    Comment: number of sex partners in the last 12 months  0  Other Topics Concern  . Not on file  Social History Narrative  . Not on file   Social Determinants of Health   Financial Resource Strain:   . Difficulty of Paying Living Expenses: Not on file  Food Insecurity:   . Worried About Charity fundraiser in the Last Year: Not on file  . Ran Out of Food in the Last Year: Not on file  Transportation Needs:   . Lack of Transportation (Medical): Not on file  . Lack of Transportation (Non-Medical): Not on file  Physical Activity:   . Days of Exercise per Week: Not on file  . Minutes of Exercise per Session: Not on file  Stress:   . Feeling of Stress : Not on file  Social Connections:   . Frequency of Communication with Friends and Family: Not on file  . Frequency of Social Gatherings with Friends and Family: Not on file  . Attends Religious Services: Not on file  . Active Member of Clubs or Organizations: Not on file  . Attends Archivist Meetings: Not on file  . Marital Status: Not on file  Intimate Partner Violence:   . Fear of Current or Ex-Partner: Not on file  . Emotionally Abused: Not on file  . Physically Abused: Not on file  . Sexually Abused: Not on file   Additional social history is notable that he is widowed for 10 years.  He lives by himself.  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  He completed 12th grade of education.  He continues to smoke cigarettes and has been smoking since age 55.  He does drink occasional beer.  He walks intermittently outside work.  Family History  Problem Relation Age of Onset  . Coronary artery disease Father        CABG  . Cancer Mother        colon  . Heart disease Brother   . Cancer Other    Additional family history is that his mother died at 4  secondary to cancer.  His father is deceased and had undergone CABG revascularization surgery.  His 2 brothers, ages 16 and 65.  ROS General: Negative; No fevers, chills, or night sweats HEENT: Negative; No changes in vision or hearing, sinus congestion, difficulty swallowing Pulmonary: Negative; No cough, wheezing, shortness of breath, hemoptysis Cardiovascular:  See HPI; No chest pain, presyncope, syncope, palpitations, edema GI: Negative; No nausea, vomiting, diarrhea, or abdominal pain GU: Negative; No dysuria, hematuria, or difficulty voiding Musculoskeletal: Negative; no myalgias, joint pain, or weakness Hematologic/Oncologic: Negative; no easy bruising, bleeding Endocrine: Negative; no heat/cold intolerance; no diabetes Neuro: Negative; no changes in balance, headaches Skin: Negative; No rashes or skin lesions Psychiatric: Positive for anxiety and depression Sleep: Positive for previous poor sleep which had significantly improved but now has not been as good.  Positive for snoring, awakening gasping for breath, frequent awakenings, and daytime sleepiness; No  bruxism, restless legs, hypnogagnic hallucinations Other comprehensive 14 point system review is negative   Physical Exam BP (!) 142/82   Pulse 73   Ht $R'5\' 8"'ZG$  (1.727 m)   Wt 187 lb (84.8 kg)   SpO2 97%   BMI 28.43 kg/m  Repeat blood pressure by me 122/76  Wt Readings from Last 3 Encounters:  09/14/19 187 lb (84.8 kg)  12/29/18 187 lb (84.8 kg)  10/14/18 183 lb (83 kg)   General: Alert, oriented, no distress.  Skin: normal turgor, no rashes, warm and dry HEENT: Normocephalic, atraumatic. Pupils equal round and reactive to light; sclera anicteric; extraocular muscles intact;  Nose without nasal septal hypertrophy Mouth/Parynx benign; Mallinpatti scale 3 Neck: No JVD, no carotid bruits; normal carotid upstroke Lungs: clear to ausculatation and percussion; no wheezing or rales Chest wall: without tenderness to  palpitation Heart: PMI not displaced, RRR, s1 s2 normal, 1/6 systolic murmur, no diastolic murmur, no rubs, gallops, thrills, or heaves Abdomen: soft, nontender; no hepatosplenomehaly, BS+; abdominal aorta nontender and not dilated by palpation. Back: no CVA tenderness Pulses 2+ Musculoskeletal: full range of motion, normal strength, no joint deformities Extremities: no clubbing cyanosis or edema, Homan's sign negative  Neurologic: grossly nonfocal; Cranial nerves grossly wnl Psychologic: Normal mood and affect   ECG (independently read by me): Normal sinus rhythm at 73 bpm.  No ectopy.  Normal intervals  December 29, 2018 ECG (independently read by me): Normal sinus rhythm at 89 bpm.  No ectopy.  Normal intervals.  January 2019 ECG (independently read by me): Normal sinus rhythm at 90 bpm.  Borderline LVH.  No ectopy.  Normal intervals.  May 2018 ECG (independently read by me):normal sinus rhythm at 69 bpm.  Normal intervals.  No ectopy.  April 2017 ECG (independently read by me): Normal sinus rhythm at 73 bpm.  Borderline voltage criteria for LVH.  LABS:  BMP Latest Ref Rng & Units 06/27/2018 03/05/2017 10/23/2016  Glucose 70 - 99 mg/dL 121(H) 149(H) 107(H)  BUN 6 - 20 mg/dL $Remove'17 14 20  'iXovNpO$ Creatinine 0.61 - 1.24 mg/dL 1.01 0.86 0.82  Sodium 135 - 145 mmol/L 136 140 137  Potassium 3.5 - 5.1 mmol/L 4.9 4.6 3.8  Chloride 98 - 111 mmol/L 105 107 107  CO2 22 - 32 mmol/L $RemoveB'24 22 22  'zKVdRsGU$ Calcium 8.9 - 10.3 mg/dL 8.9 9.1 8.6(L)     Hepatic Function Latest Ref Rng & Units 03/05/2017 10/23/2016 05/24/2015  Total Protein 6.5 - 8.1 g/dL 6.9 7.3 6.9  Albumin 3.5 - 5.0 g/dL 4.0 3.9 4.3  AST 15 - 41 U/L 32 30 31  ALT 17 - 63 U/L 43 37 42  Alk Phosphatase 38 - 126 U/L 63 73 59  Total Bilirubin 0.3 - 1.2 mg/dL 0.6 0.5 0.7    CBC Latest Ref Rng & Units 06/27/2018 03/05/2017 10/23/2016  WBC 4.0 - 10.5 K/uL 7.1 8.5 8.5  Hemoglobin 13.0 - 17.0 g/dL 17.7(H) 17.5(H) 16.6  Hematocrit 39 - 52 % 54.2(H) 50.7 47.9   Platelets 150 - 400 K/uL 175 151 177   Lab Results  Component Value Date   MCV 93.6 06/27/2018   MCV 91.0 03/05/2017   MCV 89.4 10/23/2016   Lab Results  Component Value Date   TSH 0.800 09/05/2011   Lab Results  Component Value Date   HGBA1C 6.7 (H) 03/05/2017     BNP    Component Value Date/Time   BNP 11.9 05/24/2015 1036    ProBNP No results found for: PROBNP   Lipid Panel     Component Value Date/Time   CHOL 83 (L) 05/06/2015 1723   TRIG 181 (H) 05/06/2015 1723   HDL 32 (L) 05/06/2015 1723   CHOLHDL 2.6 05/06/2015 1723   CHOLHDL 2.7 02/11/2015 0958   VLDL  45 (H) 02/11/2015 0958   LDLCALC 15 05/06/2015 1723    RADIOLOGY: No results found.  IMPRESSION:  1. Chronic systolic heart failure ([place])   2. NICM (nonischemic cardiomyopathy) ([place])   3. Essential hypertension   4. Hyperlipidemia, mixed   5. Reevaluate for OSA (obstructive sleep apnea)   6. Automatic implantable cardioverter-defibrillator in situ    ASSESSMENT AND PLAN: Mr. Lacharles Altschuler is a 54 year-old gentleman who has a long-standing history of tobacco use. He underwent initial defibrillator insertion in 8101 for systolic heart failure and in 2014 underwent an ICD upgrade generator change.  An echo Doppler study  by [facility] in 2015 showed an ejection fraction at a ~ 45% with LVH and grade 1 diastolic dysfunction.  His  echo Doppler study from October 2018  showed significant improvement in  EF at 50-55%.  He has not had any defibrillator discharges.  Continues to be on low-dose digoxin 0.125 mg daily.  He has experienced episodes of increased lower extremity edema.  There is no significant edema presently.  I have suggested that he can take furosemide 20 mg on a as needed basis if this occurs.  He has significant mixed hyperlipidemia and in March 2021 triglycerides had increased to 569 with a total cholesterol of 204 HDL of 20.  He has been on fenofibrate and Vascepa was recommended.   Apparently insurance denied Vascepa.  I have suggested starting Lovaza 2 capsules twice a day in its place which hopefully insurance will improve since it is generic.  He has had symptoms suggesting progression of his previously documented upper airway resistance syndrome with symptoms now suggestive of overt sleep apnea.  He is on insulin, Victoza and Metformin for his diabetes mellitus.  His primary physician is now Dr. Gorden Harms who checks his laboratory.  I am recommending he undergo a follow-up echo Doppler study to reassess systolic and diastolic function.  If EF is reduced, he may benefit from transition to [facility] rather than stay on lisinopril.  I am also recommending a follow-up sleep study for reevaluation of probable sleep apnea.  I will see him in the office in several months for follow-up evaluation and further recommendations will be at that time.   Troy Sine, MD, [facility] 09/20/2019 2:24 PM

## 2019-09-14 NOTE — Patient Instructions (Signed)
Medication Instructions:  BEGIN TAKING LASIX 20MG  AS NEEDED  *If you need a refill on your cardiac medications before your next appointment, please call your pharmacy*  Testing/Procedures: Your physician has requested that you have an echocardiogram. Echocardiography is a painless test that uses sound waves to create images of your heart. It provides your doctor with information about the size and shape of your heart and how well your heart's chambers and valves are working. This procedure takes approximately one hour. There are no restrictions for this procedure.  Your physician has recommended that you have a sleep study. This test records several body functions during sleep, including: brain activity, eye movement, oxygen and carbon dioxide blood levels, heart rate and rhythm, breathing rate and rhythm, the flow of air through your mouth and nose, snoring, body muscle movements, and chest and belly movement.     Follow-Up: At [facility], you and your health needs are our priority.  As part of our continuing mission to provide you with exceptional heart care, we have created designated Provider Care Teams.  These Care Teams include your primary Cardiologist (physician) and Advanced Practice Providers (APPs -  Physician Assistants and Nurse Practitioners) who all work together to provide you with the care you need, when you need it.  We recommend signing up for the patient portal called "MyChart".  Sign up information is provided on this After Visit Summary.  MyChart is used to connect with patients for Virtual Visits (Telemedicine).  Patients are able to view lab/test results, encounter notes, upcoming appointments, etc.  Non-urgent messages can be sent to your provider as well.   To learn more about what you can do with MyChart, go to [email or web address].    Your next appointment:   3-4 month(s)  The format for your next appointment:   In Person  Provider:   [name],  MD

## 2019-09-20 ENCOUNTER — Encounter: Payer: Self-pay | Admitting: Cardiovascular Disease

## 2019-09-22 ENCOUNTER — Ambulatory Visit (INDEPENDENT_AMBULATORY_CARE_PROVIDER_SITE_OTHER): Payer: 59 | Admitting: *Deleted

## 2019-09-22 DIAGNOSIS — I428 Other cardiomyopathies: Secondary | ICD-10-CM

## 2019-09-23 LAB — CUP PACEART REMOTE DEVICE CHECK
Battery Remaining Longevity: 66 mo
Battery Remaining Percentage: 72 %
Brady Statistic RV Percent Paced: 0 %
Date Time Interrogation Session: 20210908112500
HighPow Impedance: 66 Ohm
Implantable Lead Implant Date: 20060117
Implantable Lead Location: 753860
Implantable Lead Model: 185
Implantable Lead Serial Number: 124785
Implantable Pulse Generator Implant Date: 20141107
Lead Channel Impedance Value: 512 Ohm
Lead Channel Pacing Threshold Amplitude: 1.4 V
Lead Channel Pacing Threshold Pulse Width: 0.8 ms
Lead Channel Setting Pacing Amplitude: 2.8 V
Lead Channel Setting Pacing Pulse Width: 0.8 ms
Lead Channel Setting Sensing Sensitivity: 0.6 mV
Pulse Gen Serial Number: 126350

## 2019-09-23 NOTE — Progress Notes (Signed)
Remote ICD transmission.   

## 2019-09-26 ENCOUNTER — Other Ambulatory Visit: Payer: Self-pay | Admitting: Internal Medicine

## 2019-10-02 ENCOUNTER — Other Ambulatory Visit: Payer: Self-pay

## 2019-10-02 ENCOUNTER — Ambulatory Visit (HOSPITAL_COMMUNITY): Payer: 59 | Attending: Cardiology

## 2019-10-02 DIAGNOSIS — Z95810 Presence of automatic (implantable) cardiac defibrillator: Secondary | ICD-10-CM | POA: Diagnosis not present

## 2019-10-02 DIAGNOSIS — I428 Other cardiomyopathies: Secondary | ICD-10-CM | POA: Diagnosis present

## 2019-10-02 DIAGNOSIS — I5022 Chronic systolic (congestive) heart failure: Secondary | ICD-10-CM | POA: Diagnosis not present

## 2019-10-02 DIAGNOSIS — E782 Mixed hyperlipidemia: Secondary | ICD-10-CM

## 2019-10-02 DIAGNOSIS — I10 Essential (primary) hypertension: Secondary | ICD-10-CM | POA: Diagnosis present

## 2019-10-02 LAB — ECHOCARDIOGRAM COMPLETE
Area-P 1/2: 3.77 cm2
S' Lateral: 3.4 cm

## 2019-11-02 ENCOUNTER — Telehealth: Payer: Self-pay | Admitting: Internal Medicine

## 2019-11-02 NOTE — Telephone Encounter (Signed)
[phone] cell number. He wants to hear from the nurse soon because he has to go to work. I told him the nurse will need a transmission from his home monitor. As soon as she get the transmission she will give him a call back.

## 2019-11-02 NOTE — Telephone Encounter (Signed)
Manual transmission received 11/02/19.   Presenting: VS 100. No new episodes recorded since 07/2019. Lead trends appear stable. Patient reports of pain to area around device d/t recent fall. Has follow-up with Dr. Lovena Le 11/03/19. Patient advised to call with any further questions or concerns, verbalized understanding.

## 2019-11-02 NOTE — Telephone Encounter (Signed)
Pt c/o Shortness Of Breath: STAT if SOB developed within the last 24 hours or pt is noticeably SOB on the phone  1. Are you currently SOB (can you hear that pt is SOB on the phone)? Just when he takes deep breaths  2. How long have you been experiencing SOB? Since Friday night 10/15 when he fell at work  3. Are you SOB when sitting or when up moving around? both  4. Are you currently experiencing any other symptoms? No, patient fell at work Friday night and states the area around his defib is sore and he feels SOB when he takes deep breaths. He has an appt with Dr. Lovena Le tomorrow but was advised by his city nurse to call Dr. Lovena Le today and let him know.

## 2019-11-03 ENCOUNTER — Other Ambulatory Visit: Payer: Self-pay

## 2019-11-03 ENCOUNTER — Encounter: Payer: Self-pay | Admitting: Internal Medicine

## 2019-11-03 ENCOUNTER — Ambulatory Visit: Payer: 59 | Admitting: Internal Medicine

## 2019-11-03 VITALS — BP 128/78 | HR 92 | Ht 68.0 in | Wt 184.8 lb

## 2019-11-03 DIAGNOSIS — I5022 Chronic systolic (congestive) heart failure: Secondary | ICD-10-CM | POA: Diagnosis not present

## 2019-11-03 DIAGNOSIS — Z95810 Presence of automatic (implantable) cardiac defibrillator: Secondary | ICD-10-CM | POA: Diagnosis not present

## 2019-11-03 DIAGNOSIS — I428 Other cardiomyopathies: Secondary | ICD-10-CM

## 2019-11-03 LAB — CUP PACEART INCLINIC DEVICE CHECK
Date Time Interrogation Session: 20211019170532
HighPow Impedance: 50 Ohm
HighPow Impedance: 71 Ohm
Implantable Lead Implant Date: 20060117
Implantable Lead Location: 753860
Implantable Lead Model: 185
Implantable Lead Serial Number: 124785
Implantable Pulse Generator Implant Date: 20141107
Lead Channel Impedance Value: 543 Ohm
Lead Channel Pacing Threshold Amplitude: 1.5 V
Lead Channel Pacing Threshold Pulse Width: 0.8 ms
Lead Channel Sensing Intrinsic Amplitude: 13.4 mV
Lead Channel Setting Pacing Amplitude: 2.8 V
Lead Channel Setting Pacing Pulse Width: 0.8 ms
Lead Channel Setting Sensing Sensitivity: 0.6 mV
Pulse Gen Serial Number: 126350

## 2019-11-03 NOTE — Patient Instructions (Signed)
Medication Instructions:  Your physician recommends that you continue on your current medications as directed. Please refer to the Current Medication list given to you today.  Labwork: None ordered.  Testing/Procedures: None ordered.  Follow-Up: Your physician wants you to follow-up in: one year with Dr. Lovena Le.   You will receive a reminder letter in the mail two months in advance. If you don't receive a letter, please call our office to schedule the follow-up appointment.  Remote monitoring is used to monitor your ICD from home. This monitoring reduces the number of office visits required to check your device to one time per year. It allows [place] to keep an eye on the functioning of your device to ensure it is working properly. You are scheduled for a device check from home on 12/22/2019. You may send your transmission at any time that day. If you have a wireless device, the transmission will be sent automatically. After your physician reviews your transmission, you will receive a postcard with your next transmission date.  Any Other Special Instructions Will Be Listed Below (If Applicable).  If you need a refill on your cardiac medications before your next appointment, please call your pharmacy.

## 2019-11-03 NOTE — Progress Notes (Signed)
HPI Justin Ray returns today for followup. He is a pleasant 54 yo man with a h/o a non-ischemic CM, s/p ICD insertion, and HTN. He has done well in the interim. He has occaisional palpitations but no ICD therapies. He denies chest pain or sob. No edema. He is still working.  Allergies  Allergen Reactions  . Codeine Shortness Of Breath and Other (See Comments)    Severe HA's.  . Niaspan [Niacin Er] Other (See Comments)    Per pt feels like skin is burning( REDNESS), and pins/needles are sticking   . Sulfonamide Derivatives Other (See Comments)    thrush     Current Outpatient Medications  Medication Sig Dispense Refill  . ARIPiprazole (ABILIFY) 2 MG tablet Take 2 mg by mouth daily.    Marland Kitchen aspirin 81 MG tablet Take 81 mg by mouth daily.     . blood glucose meter kit and supplies KIT Test blood sugar 3 times daily. Dx code: E11.65 1 each 0  . carvedilol (COREG) 25 MG tablet take 1 tablet by mouth twice a day WITH A MEAL 180 tablet 1  . clonazePAM (KLONOPIN) 0.5 MG tablet Take 0.5 mg by mouth 2 (two) times daily as needed.    . desvenlafaxine (PRISTIQ) 100 MG 24 hr tablet TAKE 1 TABLET BY MOUTH ONCE DAILY. 90 tablet 3  . digoxin (LANOXIN) 0.125 MG tablet TAKE 1 TABLET BY MOUTH DAILY. 90 tablet 3  . fenofibrate 160 MG tablet Take 1 tablet (160 mg total) by mouth daily.    . furosemide (LASIX) 20 MG tablet Take 1 tablet (20 mg total) by mouth daily. 90 tablet 3  . hydrochlorothiazide (HYDRODIURIL) 25 MG tablet Take 25 mg by mouth daily.    Marland Kitchen ibuprofen (ADVIL) 200 MG tablet Take 400 mg by mouth every 6 (six) hours as needed (pain /  headache).    . insulin NPH-regular Human (HUMULIN 70/30) (70-30) 100 UNIT/ML injection inject 65 units subcutaneously every morning and inject 50-55 units subcutaneously every evening 40 vial 11  . lisinopril (ZESTRIL) 20 MG tablet TAKE 1 TABLET BY MOUTH TWICE DAILY 180 tablet 2  . metFORMIN (GLUCOPHAGE-XR) 500 MG 24 hr tablet Take 1,000 mg by mouth 2  (two) times daily.   0  . omega-3 acid ethyl esters (LOVAZA) 1 g capsule Take 2 capsules by mouth 2 (two) times daily.    . sildenafil (VIAGRA) 100 MG tablet Take 100 mg by mouth daily as needed for erectile dysfunction.    . tamsulosin (FLOMAX) 0.4 MG CAPS capsule Take 0.4 mg by mouth daily.    Marland Kitchen tiZANidine (ZANAFLEX) 4 MG tablet Take 1 tablet (4 mg total) by mouth 3 (three) times daily. 30 tablet 1  . VICTOZA 18 MG/3ML SOPN Inject 0.6 mg into the skin every morning.   0   No current facility-administered medications for this visit.     Past Medical History:  Diagnosis Date  . AICD (automatic cardioverter/defibrillator) present   . Anxiety   . Cancer ([place])    skin cancer  . Cardiomyopathy   . Carpal tunnel syndrome 10/14   right-GSC  . Cataract   . CHF (congestive heart failure) ([place])   . Depression   . Diabetes mellitus    TYPE 2 , DIAGNOSED AS AN ADULT   . Dyslipidemia   . Hypertension   . ICD (implantable cardioverter-defibrillator) in place   . Pneumonia OVER 10 YEARS AGO   . Sleep apnea  was tested many yr ago-does not use a cpap-  . Wears glasses     ROS:   All systems reviewed and negative except as noted in the HPI.   Past Surgical History:  Procedure Laterality Date  . ANTERIOR CERVICAL DECOMP/DISCECTOMY FUSION N/A 03/06/2017   Procedure: ANTERIOR CERVICAL DECOMPRESSION FUSION,CERVICAL 6-7 WITH INSTRUMENTATION AND ALLOGRAFT REQUESTED TIME 2.5 HRS;  Surgeon: Phylliss Bob, MD;  Location: [facility];  Service: Orthopedics;  Laterality: N/A;  ANTERIOR CERVICAL DECOMPRESSION FUSION, CERVICAL 6-7 WITH INSTRUMENTATION AND ALLOGRAFT REQUESTED TIME 2.5 HRS  . CARDIAC DEFIBRILLATOR PLACEMENT  02/01/2004  . CARDIAC DEFIBRILLATOR PLACEMENT  11/14   replaced with new one  . CARPAL TUNNEL RELEASE Left 12/19/2012   Procedure: LEFT CARPAL TUNNEL RELEASE;  Surgeon: Roseanne Kaufman, MD;  Location: [facility];  Service: Orthopedics;  Laterality: Left;  . CARPAL TUNNEL  RELEASE Right 02/1999  . EYE SURGERY Bilateral    cataract surgery with lens implants  . IMPLANTABLE CARDIOVERTER DEFIBRILLATOR (ICD) GENERATOR CHANGE N/A 11/21/2012   Procedure: ICD GENERATOR CHANGE;  Surgeon: Evans Lance, MD;  Location: [facility] CATH LAB;  Service: Cardiovascular;  Laterality: N/A;  . SHOULDER ARTHROSCOPY Left 07/03/2018   Procedure: ARTHROSCOPY SHOULDER;  Surgeon: Tania Ade, MD;  Location: [facility];  Service: Orthopedics;  Laterality: Left;  . SHOULDER ARTHROSCOPY WITH SUBACROMIAL DECOMPRESSION AND BICEP TENDON REPAIR Left 07/03/2018   Procedure: SHOULDER ARTHROSCOPY WITH SUBACROMIAL DECOMPRESSION  BICEP TENODESIS;  Surgeon: Tania Ade, MD;  Location: [facility];  Service: Orthopedics;  Laterality: Left;  . TONSILLECTOMY       Family History  Problem Relation Age of Onset  . Coronary artery disease Father        CABG  . Cancer Mother        colon  . Heart disease Brother   . Cancer Other      Social History   Socioeconomic History  . Marital status: Widowed    Spouse name: Not on file  . Number of children: 0  . Years of education: Not on file  . Highest education level: Not on file  Occupational History  . Occupation: maintenance    Employer: UNEMPLOYED  Tobacco Use  . Smoking status: Current Every Day Smoker    Packs/day: 1.50    Types: Cigarettes  . Smokeless tobacco: Never Used  Vaping Use  . Vaping Use: Never used  Substance and Sexual Activity  . Alcohol use: Yes    Comment: A SIX PACK EVERY SIX MONTHS   . Drug use: No  . Sexual activity: Never    Comment: number of sex partners in the last 12 months  0  Other Topics Concern  . Not on file  Social History Narrative  . Not on file   Social Determinants of Health   Financial Resource Strain:   . Difficulty of Paying Living Expenses: Not on file  Food Insecurity:   . Worried About Charity fundraiser in the Last Year: Not on file  . Ran Out of  Food in the Last Year: Not on file  Transportation Needs:   . Lack of Transportation (Medical): Not on file  . Lack of Transportation (Non-Medical): Not on file  Physical Activity:   . Days of Exercise per Week: Not on file  . Minutes of Exercise per Session: Not on file  Stress:   . Feeling of Stress : Not on file  Social Connections:   . Frequency of  Communication with Friends and Family: Not on file  . Frequency of Social Gatherings with Friends and Family: Not on file  . Attends Religious Services: Not on file  . Active Member of Clubs or Organizations: Not on file  . Attends Archivist Meetings: Not on file  . Marital Status: Not on file  Intimate Partner Violence:   . Fear of Current or Ex-Partner: Not on file  . Emotionally Abused: Not on file  . Physically Abused: Not on file  . Sexually Abused: Not on file     BP 128/78   Pulse 92   Ht $R'5\' 8"'sv$  (1.727 m)   Wt 184 lb 12.8 oz (83.8 kg)   SpO2 95%   BMI 28.10 kg/m   Physical Exam:  Well appearing NAD HEENT: Unremarkable Neck:  No JVD, no thyromegally Lymphatics:  No adenopathy Back:  No CVA tenderness Lungs:  Clear with no wheezes HEART:  Regular rate rhythm, no murmurs, no rubs, no clicks Abd:  soft, positive bowel sounds, no organomegally, no rebound, no guarding Ext:  2 plus pulses, no edema, no cyanosis, no clubbing Skin:  No rashes no nodules Neuro:  CN II through XII intact, motor grossly intact  DEVICE  Normal device function.  See PaceArt for details.   Assess/Plan: 1. Chronic systolic heart failure - his symptoms remain class 2A. He will continue his current guideline directed medical therapy. 2. ICD - his  [place] [employer] ICD is working normally with 5 years of longevity remaining.  3. HTN - his bp is normal. He will maintain a low sodium diet. 4. Tobacco abuse - he is down to a half PPD. I encouraged him to call us when he gets down to 5 a day and would like to stop. We will prescribe nicotine  patches.  Carleene Overlie Athanasia Stanwood,MD

## 2019-11-09 ENCOUNTER — Other Ambulatory Visit: Payer: Self-pay | Admitting: Nurse Practitioner

## 2019-11-09 ENCOUNTER — Ambulatory Visit
Admission: RE | Admit: 2019-11-09 | Discharge: 2019-11-09 | Disposition: A | Discharge status: Home / Self Care | Payer: No Typology Code available for payment source | Source: Ambulatory Visit | Attending: Nurse Practitioner | Admitting: Nurse Practitioner

## 2019-11-09 DIAGNOSIS — R071 Chest pain on breathing: Secondary | ICD-10-CM

## 2019-11-09 DIAGNOSIS — W19XXXA Unspecified fall, initial encounter: Secondary | ICD-10-CM

## 2019-11-09 DIAGNOSIS — R0602 Shortness of breath: Secondary | ICD-10-CM

## 2019-11-16 ENCOUNTER — Encounter: Payer: Self-pay | Admitting: Internal Medicine

## 2019-11-16 ENCOUNTER — Telehealth: Payer: Self-pay | Admitting: *Deleted

## 2019-11-16 NOTE — Telephone Encounter (Signed)
   Primary Cardiologist: [name], MD  Chart reviewed as part of pre-operative protocol coverage. Patient was contacted 11/16/2019 in reference to pre-operative risk assessment for pending surgery as outlined below.  Justin Ray was last seen on 11/03/2019 by Dr. Lovena Le.  Since that day, Justin Ray has done well without chest pain or shortness of breath. Recent echocardiogram showed normal ejection fraction.   Therefore, based on ACC/AHA guidelines, the patient would be at acceptable risk for the planned procedure without further cardiovascular testing.   The patient was advised that if he develops new symptoms prior to surgery to contact our office to arrange for a follow-up visit, and he verbalized understanding.  I will route this recommendation to the requesting party via Epic fax function and remove from pre-op pool. Please call with questions.  He may hold aspirin for 7 days prior to the surgery and restart as soon as possible after the procedure.   [place], [place] 11/16/2019, 11:01 AM

## 2019-11-16 NOTE — Progress Notes (Unsigned)
[facility]   Patient Information: Name: Justin Ray  DOB: 1965-06-06  MRN: [id number]    Planned Procedure:  Colonoscopy Surgeon:  Dr. Kyra Leyland Date of Procedure:  12/13/2019   Device Information:   Clinic EP Physician:   Cristopher Peru, MD Device Type:  Defibrillator Manufacturer and Phone #:  [employer]: [phone] Pacemaker Dependent?:  No Date of Last Device Check:  11/03/2019        Normal Device Function?:  Yes     Electrophysiologist's Recommendations:    Have magnet available.  Provide continuous ECG monitoring when magnet is used or reprogramming is to be performed.   Have Magnet available it may interfere with device.   Per Device Clinic Standing Orders, [name]  11/16/2019 11:54 AM

## 2019-11-16 NOTE — Telephone Encounter (Signed)
   [facility] Pre-operative Risk Assessment    HEARTCARE STAFF: - Please ensure there is not already an duplicate clearance open for this procedure. - Under Visit Info/Reason for Call, type in Other and utilize the format Clearance MM/DD/YY or Clearance TBD. Do not use dashes or single digits. - If request is for dental extraction, please clarify the # of teeth to be extracted.  Request for surgical clearance:  1. What type of surgery is being performed? COLONOSCOPY   2. When is this surgery scheduled? 12/13/19   3. What type of clearance is required (medical clearance vs. Pharmacy clearance to hold med vs. Both)? MEDICAL AND DEVICE CLEARANCE, (CLEARANCE FORM HAS BEEN HANDED OVER TO THE DEVICE CLINIC FOR CLEARANCE)  4. Are there any medications that need to be held prior to surgery and how long? ASA    5. Practice name and name of physician performing surgery? [facility]; DR. Christia Reading MISENHEIMER   6. What is the office phone number? [phone]   7.   What is the office fax number? [phone] ATTN: TINA  8.   Anesthesia type (None, local, MAC, general) ? NOT LISTED   Julaine Hua 11/16/2019, 10:16 AM  _________________________________________________________________   (provider comments below)

## 2019-11-16 NOTE — Telephone Encounter (Signed)
Device form completed. Faxed sent write fax.

## 2019-11-26 NOTE — Telephone Encounter (Signed)
Clearance request faxed to our office today asking to please advise on any special instructions for defibrillator during colonoscopy. I see in the previous notes that device clinic did fax notes in regards to device. I will send this message to the device clinic as they may need to resend the device clearance form.

## 2019-12-28 ENCOUNTER — Ambulatory Visit: Payer: 59 | Admitting: Cardiovascular Disease

## 2020-01-14 ENCOUNTER — Ambulatory Visit (INDEPENDENT_AMBULATORY_CARE_PROVIDER_SITE_OTHER): Payer: 59

## 2020-01-14 DIAGNOSIS — I428 Other cardiomyopathies: Secondary | ICD-10-CM | POA: Diagnosis not present

## 2020-01-14 DIAGNOSIS — I5022 Chronic systolic (congestive) heart failure: Secondary | ICD-10-CM

## 2020-01-14 LAB — CUP PACEART REMOTE DEVICE CHECK
Battery Remaining Longevity: 66 mo
Battery Remaining Percentage: 67 %
Brady Statistic RV Percent Paced: 0 %
Date Time Interrogation Session: 20211230113700
HighPow Impedance: 66 Ohm
Implantable Lead Implant Date: 20060117
Implantable Lead Location: 753860
Implantable Lead Model: 185
Implantable Lead Serial Number: 124785
Implantable Pulse Generator Implant Date: 20141107
Lead Channel Impedance Value: 514 Ohm
Lead Channel Pacing Threshold Amplitude: 1.5 V
Lead Channel Pacing Threshold Pulse Width: 0.8 ms
Lead Channel Setting Pacing Amplitude: 2.8 V
Lead Channel Setting Pacing Pulse Width: 0.8 ms
Lead Channel Setting Sensing Sensitivity: 0.6 mV
Pulse Gen Serial Number: 126350

## 2020-01-28 NOTE — Progress Notes (Signed)
Remote ICD transmission.   

## 2020-02-10 ENCOUNTER — Other Ambulatory Visit: Payer: Self-pay

## 2020-02-10 ENCOUNTER — Ambulatory Visit: Payer: 59 | Admitting: Cardiovascular Disease

## 2020-02-10 ENCOUNTER — Encounter: Payer: Self-pay | Admitting: Cardiovascular Disease

## 2020-02-10 VITALS — BP 124/82 | HR 77 | Ht 68.0 in | Wt 183.0 lb

## 2020-02-10 DIAGNOSIS — R0683 Snoring: Secondary | ICD-10-CM | POA: Diagnosis not present

## 2020-02-10 DIAGNOSIS — G4733 Obstructive sleep apnea (adult) (pediatric): Secondary | ICD-10-CM | POA: Diagnosis not present

## 2020-02-10 DIAGNOSIS — I10 Essential (primary) hypertension: Secondary | ICD-10-CM

## 2020-02-10 DIAGNOSIS — R351 Nocturia: Secondary | ICD-10-CM

## 2020-02-10 DIAGNOSIS — I428 Other cardiomyopathies: Secondary | ICD-10-CM

## 2020-02-10 DIAGNOSIS — E118 Type 2 diabetes mellitus with unspecified complications: Secondary | ICD-10-CM

## 2020-02-10 DIAGNOSIS — Z794 Long term (current) use of insulin: Secondary | ICD-10-CM

## 2020-02-10 NOTE — Progress Notes (Signed)
Patient ID: Amarri Satterly, male   DOB: [birth year], 55 y.o.   MRN: [id number]     Primary MD: Dr. Nona Dell  PATIENT PROFILE: Vasilis Luhman is a 55 y.o. male who was initially referred through the courtesy of Dr. Laney Pastor for cardiology care with me.  The patient had been previously followed in [place] for his cardiology care has also seen Dr. Lovena Le who follows him from an electrophysiologic standpoint with his history of cardiomyopathy and ICD implantation.  I last saw him in August 2021.  He presents for follow-up evaluation.    HPI:  Waleed Dettman has history of chronic systolic heart failure, class II and is felt to have a nonischemic cardiomyopathy.  He underwent insertion of an ICD in January 2006 and in November 2014 underwent ICD generator change by Dr.Taylor.  He has a history of anxiety/depression, type 2 diabetes mellitus on insulin, hyperlipidemia, and has continued have difficulty with elevated triglycerides.  He had been followed by the cardiology group in [place], but has developed recent discontent with his physician and is requested a new cardiology evaluation.  The patient has been on lisinopril 20 mg twice a day, digoxin 0.125 mg and carvedilol 25 mg twice a day for his coronary myopathy.  Hypertension.  He has been on Crestor 20 mg for hyperlipidemia.  An earlier this year he was started on placebo 2 capsules twice a day due to persistent elevated triglyceride levels.  His last lipid study 2 months ago showed a total cholesterol of 73, triglycerides 225, HDL 27, and EDL 45.  I suspect his LDL is inaccurate and this was recorded as 1.  He has a history of continued tobacco use, and started smoking at age 53 and currently is trying to quit but is still smoking.  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  He does not routinely exercise.  In 2004 he apparently underwent a sleep study and was told of having obstructive sleep apnea but was  never started on CPAP therapy.  Presently, he goes to bed about 1 AM and wakes up at 8 AM.  He often is up at least 2-4 times per night.  His sleep is nonrestorative.  He snores loudly.  He does note daytime sleepiness.  He is diabetic on insulin.  He has been taking Xanax as needed for anxiety.     He underwent a sleep study at the [facility] on 06/29/2015.  This study was consistent with upper airway resistance syndrome with an AHI of 2.6 per hour.  His mean oxygen saturation was 88.47% with a minimum oxygen desaturation to 85%.  There was evidence for soft snoring.  Of note, he was never able to achieve any supine sleep.    An Epworth Sleepiness Scale score was calculated today in the office and this endorsed at 9 with a moderate chance of dozing sitting and reading, watching TV, and while lying down to rest in the afternoon and circumstances persist.  He had a slight chance of dozing dozing while sitting inactive in a public place, as a passenger in a car, and sitting quietly after lunch.    I saw him in May 2018 and in January 2019.  He felt well from a cardiovascular standpoint and denies any chest pain or shortness of breath.  He saw Dr. Lovena Le in June 2018 and he was doing well.  Systolic heart failure symptoms had improved and were now class  I.  He has not had any recurrent ICD therapies but does have evidence for nonsustained ventricular tachycardia.  He has been undergoing physical therapy for shoulder and neck.  He underwent a follow-up echo Doppler study in October 2018, which now showed significant improvement in LV function with an EF of 50-55%.  Left atrium was mildly dilated.    I saw him in December 2020.  At that time he denied any chest pain or shortness of breath and admitted to a rare isolated palpitation lasting only seconds.  He was sleeping well.  He is followed closely by Dr. Mellody Memos who checks his laboratory.  Hemoglobin A1c had improved to 6.6 on December 25, 2018.  There is no change in exercise tolerance.  He had recent laboratory in June 2020 which showed a total cholesterol 169, HDL 27, and triglycerides were 572.  He has been on Vascepa 2 capsules twice a day.  He continues to be on carvedilol 25 mg twice a day, digoxin 0.125 mg daily, HCTZ 25 mg in addition to lisinopril 20 mg daily for his blood pressure and prior heart failure.  He is diabetic on metformin and Victoza.    When I last saw him in August 2021 he continued  to be stable with reference to chest pain.  At times he does admit to leg swelling despite HCTZ 25 mg daily. Triglycerides in March 2021 were 569. Apparently insurance denied Vascepa.  He was not sleeping as well as he had in the past and admits to nocturia 4-5 times per night, snoring, frequent awakenings, awakening gasping for breath as well as daytime sleepiness.  During that evaluation, his sleep disordered breathing progressed and I recommended he under go a diagnostic polysomnogram.  On October 02, 2019 he underwent a follow-up echo Doppler study showed an EF of 55 to 60% with grade 1 diastolic dysfunction and mild aortic sclerosis without stenosis.  Apparently, Mr. Maryann Alar in lab diagnostic polysomnogram was denied by his insurance.  Patient continues to sleep poorly and admits to frequent awakening, and is having nocturia at least 4-5 times per night.  He denies chest pain.  An Epworth Sleepiness Scale score was calculated in the office today and this endorsed at 10 consistent with excessive daytime sleepiness.  He presents for evaluation.   Past Medical History:  Diagnosis Date   AICD (automatic cardioverter/defibrillator) present    Anxiety    Cancer ([place])    skin cancer   Cardiomyopathy    Carpal tunnel syndrome 10/14   right-GSC   Cataract    CHF (congestive heart failure) (HCC)    Depression    Diabetes mellitus    TYPE 2 , DIAGNOSED AS AN ADULT    Dyslipidemia    Hypertension    ICD (implantable  cardioverter-defibrillator) in place    Pneumonia OVER 10 YEARS AGO    Sleep apnea    was tested many yr ago-does not use a cpap-   Wears glasses     Past Surgical History:  Procedure Laterality Date   ANTERIOR CERVICAL DECOMP/DISCECTOMY FUSION N/A 03/06/2017   Procedure: ANTERIOR CERVICAL DECOMPRESSION FUSION,CERVICAL 6-7 WITH INSTRUMENTATION AND ALLOGRAFT REQUESTED TIME 2.5 HRS;  Surgeon: Phylliss Bob, MD;  Location: [facility];  Service: Orthopedics;  Laterality: N/A;  ANTERIOR CERVICAL DECOMPRESSION FUSION, CERVICAL 6-7 WITH INSTRUMENTATION AND ALLOGRAFT REQUESTED TIME 2.5 HRS   CARDIAC DEFIBRILLATOR PLACEMENT  02/01/2004   CARDIAC DEFIBRILLATOR PLACEMENT  11/14   replaced with new one   CARPAL  TUNNEL RELEASE Left 12/19/2012   Procedure: LEFT CARPAL TUNNEL RELEASE;  Surgeon: Roseanne Kaufman, MD;  Location: [facility];  Service: Orthopedics;  Laterality: Left;   CARPAL TUNNEL RELEASE Right 02/1999   EYE SURGERY Bilateral    cataract surgery with lens implants   IMPLANTABLE CARDIOVERTER DEFIBRILLATOR (ICD) GENERATOR CHANGE N/A 11/21/2012   Procedure: ICD GENERATOR CHANGE;  Surgeon: Evans Lance, MD;  Location: [facility] C[facility] LAB;  Service: Cardiovascular;  Laterality: N/A;   SHOULDER ARTHROSCOPY Left 07/03/2018   Procedure: ARTHROSCOPY SHOULDER;  Surgeon: Tania Ade, MD;  Location: [facility];  Service: Orthopedics;  Laterality: Left;   SHOULDER ARTHROSCOPY WITH SUBACROMIAL DECOMPRESSION AND BICEP TENDON REPAIR Left 07/03/2018   Procedure: SHOULDER ARTHROSCOPY WITH SUBACROMIAL DECOMPRESSION  BICEP TENODESIS;  Surgeon: Tania Ade, MD;  Location: [facility];  Service: Orthopedics;  Laterality: Left;   TONSILLECTOMY      Allergies  Allergen Reactions   Codeine Shortness Of Breath and Other (See Comments)    Severe HA's.   Niaspan [Niacin Er] Other (See Comments)    Per pt feels like skin is burning( REDNESS), and pins/needles  are sticking    Sulfonamide Derivatives Other (See Comments)    thrush    Current Outpatient Medications  Medication Sig Dispense Refill   ARIPiprazole (ABILIFY) 2 MG tablet Take 2 mg by mouth daily.     aspirin 81 MG tablet Take 81 mg by mouth daily.      blood glucose meter kit and supplies KIT Test blood sugar 3 times daily. Dx code: E11.65 1 each 0   carvedilol (COREG) 25 MG tablet take 1 tablet by mouth twice a day WITH A MEAL 180 tablet 1   clonazePAM (KLONOPIN) 0.5 MG tablet Take 0.5 mg by mouth 2 (two) times daily as needed.     desvenlafaxine (PRISTIQ) 100 MG 24 hr tablet TAKE 1 TABLET BY MOUTH ONCE DAILY. 90 tablet 3   digoxin (LANOXIN) 0.125 MG tablet TAKE 1 TABLET BY MOUTH DAILY. 90 tablet 3   fenofibrate 160 MG tablet Take 1 tablet (160 mg total) by mouth daily.     hydrochlorothiazide (HYDRODIURIL) 25 MG tablet Take 25 mg by mouth daily.     ibuprofen (ADVIL) 200 MG tablet Take 400 mg by mouth every 6 (six) hours as needed (pain /  headache).     insulin NPH-regular Human (HUMULIN 70/30) (70-30) 100 UNIT/ML injection inject 65 units subcutaneously every morning and inject 50-55 units subcutaneously every evening 40 vial 11   lisinopril (ZESTRIL) 20 MG tablet TAKE 1 TABLET BY MOUTH TWICE DAILY 180 tablet 2   metFORMIN (GLUCOPHAGE-XR) 500 MG 24 hr tablet Take 1,000 mg by mouth 2 (two) times daily.   0   omega-3 acid ethyl esters (LOVAZA) 1 g capsule Take 2 capsules by mouth 2 (two) times daily.     sildenafil (VIAGRA) 100 MG tablet Take 100 mg by mouth daily as needed for erectile dysfunction.     VICTOZA 18 MG/3ML SOPN Inject 0.6 mg into the skin every morning.   0   furosemide (LASIX) 20 MG tablet Take 1 tablet (20 mg total) by mouth daily. 90 tablet 3   tiZANidine (ZANAFLEX) 4 MG tablet Take 1 tablet (4 mg total) by mouth 3 (three) times daily. (Patient not taking: Reported on 02/10/2020) 30 tablet 1   No current facility-administered medications for this  visit.    Social History   Socioeconomic History   Marital status:  Widowed    Spouse name: Not on file   Number of children: 0   Years of education: Not on file   Highest education level: Not on file  Occupational History   Occupation: maintenance    Employer: UNEMPLOYED  Tobacco Use   Smoking status: Current Every Day Smoker    Packs/day: 1.50    Types: Cigarettes   Smokeless tobacco: Never Used  Vaping Use   Vaping Use: Never used  Substance and Sexual Activity   Alcohol use: Yes    Comment: A SIX PACK EVERY SIX MONTHS    Drug use: No   Sexual activity: Never    Comment: number of sex partners in the last 12 months  0  Other Topics Concern   Not on file  Social History Narrative   Not on file   Social Determinants of Health   Financial Resource Strain: Not on file  Food Insecurity: Not on file  Transportation Needs: Not on file  Physical Activity: Not on file  Stress: Not on file  Social Connections: Not on file  Intimate Partner Violence: Not on file   Additional social history is notable that he is widowed for 10 years.  He lives by himself.  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  He completed 12th grade of education.  He continues to smoke cigarettes and has been smoking since age 52.  He does drink occasional beer.  He walks intermittently outside work.  Family History  Problem Relation Age of Onset   Coronary artery disease Father        CABG   Cancer Mother        colon   Heart disease Brother    Cancer Other    Additional family history is that his mother died at 80 secondary to cancer.  His father is deceased and had undergone CABG revascularization surgery.  His 2 brothers, ages 26 and 22.  ROS General: Negative; No fevers, chills, or night sweats HEENT: Negative; No changes in vision or hearing, sinus congestion, difficulty swallowing Pulmonary: Negative; No cough, wheezing, shortness of breath,  hemoptysis Cardiovascular:  See HPI; No chest pain, presyncope, syncope, palpitations, edema GI: Negative; No nausea, vomiting, diarrhea, or abdominal pain GU: Negative; No dysuria, hematuria, or difficulty voiding Musculoskeletal: Negative; no myalgias, joint pain, or weakness Hematologic/Oncologic: Negative; no easy bruising, bleeding Endocrine: Negative; no heat/cold intolerance; no diabetes Neuro: Negative; no changes in balance, headaches Skin: Negative; No rashes or skin lesions Psychiatric: Positive for anxiety and depression Sleep: Positive for previous poor sleep which had significantly improved but now has not been as good.  Positive for snoring, awakening gasping for breath, frequent awakenings, and daytime sleepiness; No  bruxism, restless legs, hypnogagnic hallucinations Other comprehensive 14 point system review is negative   Physical Exam BP 124/82 (BP Location: Left Arm, Patient Position: Sitting)    Pulse 77    Ht $R'5\' 8"'Vm$  (1.727 m)    Wt 183 lb (83 kg)    SpO2 95%    BMI 27.83 kg/m    Repeat blood pressure by me was 126/80.  Wt Readings from Last 3 Encounters:  02/10/20 183 lb (83 kg)  11/03/19 184 lb 12.8 oz (83.8 kg)  09/14/19 187 lb (84.8 kg)   General: Alert, oriented, no distress.  Skin: normal turgor, no rashes, warm and dry HEENT: Normocephalic, atraumatic. Pupils equal round and reactive to light; sclera anicteric; extraocular muscles intact;  Nose without nasal septal hypertrophy  Mouth/Parynx benign; Mallinpatti scale 3 Neck: No JVD, no carotid bruits; normal carotid upstroke Lungs: clear to ausculatation and percussion; no wheezing or rales Chest wall: without tenderness to palpitation Heart: PMI not displaced, RRR, s1 s2 normal, 1/6 systolic murmur, no diastolic murmur, no rubs, gallops, thrills, or heaves Abdomen: soft, nontender; no hepatosplenomehaly, BS+; abdominal aorta nontender and not dilated by palpation. Back: no CVA tenderness Pulses  2+ Musculoskeletal: full range of motion, normal strength, no joint deformities Extremities: no clubbing cyanosis or edema, Homan's sign negative  Neurologic: grossly nonfocal; Cranial nerves grossly wnl Psychologic: Normal mood and affect   ECG (independently read by me): NSR at 77; QTc 427                August 2021 ECG (independently read by me): Normal sinus rhythm at 73 bpm.  No ectopy.  Normal intervals  December 29, 2018 ECG (independently read by me): Normal sinus rhythm at 89 bpm.  No ectopy.  Normal intervals.  January 2019 ECG (independently read by me): Normal sinus rhythm at 90 bpm.  Borderline LVH.  No ectopy.  Normal intervals.  May 2018 ECG (independently read by me):normal sinus rhythm at 69 bpm.  Normal intervals.  No ectopy.  April 2017 ECG (independently read by me): Normal sinus rhythm at 73 bpm.  Borderline voltage criteria for LVH.  LABS:  BMP Latest Ref Rng & Units 06/27/2018 03/05/2017 10/23/2016  Glucose 70 - 99 mg/dL 121(H) 149(H) 107(H)  BUN 6 - 20 mg/dL $Remove'17 14 20  'QDjBAxx$ Creatinine 0.61 - 1.24 mg/dL 1.01 0.86 0.82  Sodium 135 - 145 mmol/L 136 140 137  Potassium 3.5 - 5.1 mmol/L 4.9 4.6 3.8  Chloride 98 - 111 mmol/L 105 107 107  CO2 22 - 32 mmol/L $RemoveB'24 22 22  'aDIYCYUq$ Calcium 8.9 - 10.3 mg/dL 8.9 9.1 8.6(L)     Hepatic Function Latest Ref Rng & Units 03/05/2017 10/23/2016 05/24/2015  Total Protein 6.5 - 8.1 g/dL 6.9 7.3 6.9  Albumin 3.5 - 5.0 g/dL 4.0 3.9 4.3  AST 15 - 41 U/L 32 30 31  ALT 17 - 63 U/L 43 37 42  Alk Phosphatase 38 - 126 U/L 63 73 59  Total Bilirubin 0.3 - 1.2 mg/dL 0.6 0.5 0.7    CBC Latest Ref Rng & Units 06/27/2018 03/05/2017 10/23/2016  WBC 4.0 - 10.5 K/uL 7.1 8.5 8.5  Hemoglobin 13.0 - 17.0 g/dL 17.7(H) 17.5(H) 16.6  Hematocrit 39.0 - 52.0 % 54.2(H) 50.7 47.9  Platelets 150 - 400 K/uL 175 151 177   Lab Results  Component Value Date   MCV 93.6 06/27/2018   MCV 91.0 03/05/2017   MCV 89.4 10/23/2016   Lab Results  Component Value Date   TSH  0.800 09/05/2011   Lab Results  Component Value Date   HGBA1C 6.7 (H) 03/05/2017     BNP    Component Value Date/Time   BNP 11.9 05/24/2015 1036    ProBNP No results found for: PROBNP   Lipid Panel     Component Value Date/Time   CHOL 83 (L) 05/06/2015 1723   TRIG 181 (H) 05/06/2015 1723   HDL 32 (L) 05/06/2015 1723   CHOLHDL 2.6 05/06/2015 1723   CHOLHDL 2.7 02/11/2015 0958   VLDL 45 (H) 02/11/2015 0958   LDLCALC 15 05/06/2015 1723    RADIOLOGY: CUP PACEART REMOTE DEVICE CHECK  Result Date: 01/14/2020 Scheduled remote reviewed. Normal device function.  Next remote 91 days. Kathy Breach, RN, CCDS, CV Remote  [employer]   IMPRESSION:  1. Snoring   2. Nocturia   3. OSA (obstructive sleep apnea)   4. Essential hypertension   5. H/O NICM (nonischemic cardiomyopathy) ([place]); normalization of LV function   6. Type 2 diabetes mellitus with complication, with long-term current use of insulin (HCC)    ASSESSMENT AND PLAN: Mr. Geremy Rister is a 55 year-old gentleman who has a long-standing history of tobacco use. He underwent initial defibrillator insertion in 7494 for systolic heart failure and in 2014 underwent an ICD upgrade generator change.  An echo Doppler study in 2015 showed an ejection fraction at a ~ 45% with LVH and grade 1 diastolic dysfunction.  His  echo Doppler study from October 2018  showed significant improvement in  EF at 50-55%.  He has not had any defibrillator discharges.  Presently, he continues to be on digoxin 0.125 mg daily, furosemeide 20 mg, carvedilol 25 mg twice a day, and lisinopril 20 mg daily.  His blood pressure is stable.  He is followed by Dr. Lovena Le in undergoes remote defibrillator checks at 23-month intervals.  He is diabetic on Metformin, Victoza and  insulin.  He also is on fenofibrate and omega-3 fatty acid Lovaza 2 capsules twice daily for elevated triglycerides.  I reviewed his echo Doppler study from September 2021 which continues to  show normalized LV function with EF 55 to 60%.  He has developed progressive symptoms of probable sleep apnea.  Since he was denied an in lab study, I have recommended he have a home study which we will try to expedite.    Troy Sine, MD, [facility] 02/11/2020 11:22 AM

## 2020-02-10 NOTE — Patient Instructions (Addendum)
Medication Instructions:  The current medical regimen is effective;  continue present plan and medications as directed. Please refer to the Current Medication list given to you today.  *If you need a refill on your cardiac medications before your next appointment, please call your pharmacy*  Lab Work:   Testing/Procedures:  NONE    HOME SLEEP STUDY-SOMEONE WILL BE     CALLING TO SCHEDULE.  Follow-Up: Your next appointment:  4 month(s) In Person with [name], MD.  At [facility], you and your health needs are our priority.  As part of our continuing mission to provide you with exceptional heart care, we have created designated Provider Care Teams.  These Care Teams include your primary Cardiologist (physician) and Advanced Practice Providers (APPs -  Physician Assistants and Nurse Practitioners) who all work together to provide you with the care you need, when you need it.

## 2020-02-11 ENCOUNTER — Encounter: Payer: Self-pay | Admitting: Cardiovascular Disease

## 2020-02-12 ENCOUNTER — Telehealth: Payer: Self-pay | Admitting: *Deleted

## 2020-02-12 NOTE — Telephone Encounter (Signed)
HST appointment date and time left on cell VM. Sleep labs contact information provided to patient in case he cannot take appointment time given.

## 2020-03-14 ENCOUNTER — Other Ambulatory Visit: Payer: Self-pay

## 2020-03-14 ENCOUNTER — Ambulatory Visit (HOSPITAL_BASED_OUTPATIENT_CLINIC_OR_DEPARTMENT_OTHER): Payer: 59 | Attending: Cardiovascular Disease | Admitting: Cardiovascular Disease

## 2020-03-14 DIAGNOSIS — R0683 Snoring: Secondary | ICD-10-CM

## 2020-03-14 DIAGNOSIS — R0902 Hypoxemia: Secondary | ICD-10-CM | POA: Diagnosis not present

## 2020-03-14 DIAGNOSIS — G4733 Obstructive sleep apnea (adult) (pediatric): Secondary | ICD-10-CM | POA: Diagnosis not present

## 2020-03-14 DIAGNOSIS — R351 Nocturia: Secondary | ICD-10-CM

## 2020-03-27 ENCOUNTER — Encounter (HOSPITAL_BASED_OUTPATIENT_CLINIC_OR_DEPARTMENT_OTHER): Payer: Self-pay | Admitting: Cardiovascular Disease

## 2020-03-27 NOTE — Procedures (Signed)
    Patient Name: Justin Ray, Justin Ray Date: 03/15/2020 Gender: Male D.O.B: [birth year] Age (years): 5 Referring Provider: [name] MD, ABSM Height (inches): 29 Interpreting Physician: [name] MD, ABSM Weight (lbs): 183 RPSGT: Jacolyn Reedy BMI: 28 MRN: [id number] Neck Size: 18.00  CLINICAL INFORMATION Sleep Study Type: HST  Indication for sleep study: Diabetes, Excessive Daytime Sleepiness, Fatigue, Hypertension, Morning Headaches, OSA  Epworth Sleepiness Score: 14  Most recent polysomnogram dated 06/29/2015 revealed an AHI of 2.6/h and RDI of 13.8/h  SLEEP STUDY TECHNIQUE A multi-channel overnight portable sleep study was performed. The channels recorded were: nasal airflow, thoracic respiratory movement, and oxygen saturation with a pulse oximetry. Snoring was also monitored.  MEDICATIONS ARIPiprazole (ABILIFY) 2 MG tablet aspirin 81 MG tablet blood glucose meter kit and supplies KIT carvedilol (COREG) 25 MG tablet clonazePAM (KLONOPIN) 0.5 MG tablet desvenlafaxine (PRISTIQ) 100 MG 24 hr tablet digoxin (LANOXIN) 0.125 MG tablet fenofibrate 160 MG tablet furosemide (LASIX) 20 MG tablet(Expired) hydrochlorothiazide (HYDRODIURIL) 25 MG tablet ibuprofen (ADVIL) 200 MG tablet insulin NPH-regular Human (HUMULIN 70/30) (70-30) 100 UNIT/ML injection lisinopril (ZESTRIL) 20 MG tablet metFORMIN (GLUCOPHAGE-XR) 500 MG 24 hr tablet omega-3 acid ethyl esters (LOVAZA) 1 g capsule sildenafil (VIAGRA) 100 MG tablet tiZANidine (ZANAFLEX) 4 MG tablet VICTOZA 18 MG/3ML SOPN Patient self administered medications include: N/A.  SLEEP ARCHITECTURE Patient was studied for 367.5 minutes. The sleep efficiency was 100.0 % and the patient was supine for 36.2%. The arousal index was 0.0 per hour.  RESPIRATORY PARAMETERS The overall AHI was 35.6 per hour, with a central apnea index of 0 per hour. Events were worse with supine sleep (AHI 41.0/h).  The oxygen nadir was 80%  during sleep. Time spent < 89% was 163.9 minutes.  CARDIAC DATA Mean heart rate during sleep was 86.3 bpm.  IMPRESSIONS - Severe obstructive sleep apnea (AHI  35.6/h). - Severe oxygen desaturation to a nadir of 80%. - Patient snored 6.6% during the sleep.  DIAGNOSIS - Obstructive Sleep Apnea (G47.33) - Nocturnal Hypoxemia (G47.36)  RECOMMENDATIONS - In this patient with significant cardiovascular comorbidities including chronic systolic heart failure recommend a CPAP titration study to further evaluate and treat his severe sleep disordered breathing. - Effort should be made to optimize nasal and oropharyngeal patency. - Avoid alcohol, sedatives and other CNS depressants that may worsen sleep apnea and disrupt normal sleep architecture. - Sleep hygiene should be reviewed to assess factors that may improve sleep quality. - Weight management and regular exercise should be initiated or continued. - Recommend a download and sleep clinic evaluation after initiation of therapy.    [Electronically signed] 03/27/2020 02:17 PM  [name] MD, [facility], [place], American Board of Sleep Medicine   NPI: [id number] [place] PH: [phone]   FX: [phone] [place]

## 2020-03-28 ENCOUNTER — Other Ambulatory Visit: Payer: Self-pay | Admitting: Cardiovascular Disease

## 2020-03-28 ENCOUNTER — Telehealth: Payer: Self-pay | Admitting: *Deleted

## 2020-03-28 DIAGNOSIS — J449 Chronic obstructive pulmonary disease, unspecified: Secondary | ICD-10-CM

## 2020-03-28 DIAGNOSIS — G4733 Obstructive sleep apnea (adult) (pediatric): Secondary | ICD-10-CM

## 2020-03-28 NOTE — Telephone Encounter (Signed)
Left message to return a call to me to discuss sleep study results and recommendations.

## 2020-03-29 ENCOUNTER — Telehealth: Payer: Self-pay | Admitting: *Deleted

## 2020-03-29 NOTE — Telephone Encounter (Signed)
Patient returned a call to me and was given HST results and recommendations. He agrees to proceed with CPAP titration study.

## 2020-04-01 ENCOUNTER — Telehealth: Payer: Self-pay | Admitting: *Deleted

## 2020-04-01 NOTE — Telephone Encounter (Signed)
Patient was notified that Justin Ray denied in lab CPAP titration sleep study. Dr Claiborne Billings ordered APAP. Orders have sent to [facility]. He understands that once his insurance benefits has been verified by [facility] they will contact him for set up depending on when they have a machine available.

## 2020-04-14 ENCOUNTER — Ambulatory Visit (INDEPENDENT_AMBULATORY_CARE_PROVIDER_SITE_OTHER): Payer: 59

## 2020-04-14 DIAGNOSIS — I428 Other cardiomyopathies: Secondary | ICD-10-CM

## 2020-04-14 LAB — CUP PACEART REMOTE DEVICE CHECK
Battery Remaining Longevity: 60 mo
Battery Remaining Percentage: 63 %
Brady Statistic RV Percent Paced: 0 %
Date Time Interrogation Session: 20220331050700
HighPow Impedance: 71 Ohm
Implantable Lead Implant Date: 20060117
Implantable Lead Location: 753860
Implantable Lead Model: 185
Implantable Lead Serial Number: 124785
Implantable Pulse Generator Implant Date: 20141107
Lead Channel Impedance Value: 561 Ohm
Lead Channel Pacing Threshold Amplitude: 1.5 V
Lead Channel Pacing Threshold Pulse Width: 0.8 ms
Lead Channel Setting Pacing Amplitude: 2.8 V
Lead Channel Setting Pacing Pulse Width: 0.8 ms
Lead Channel Setting Sensing Sensitivity: 0.6 mV
Pulse Gen Serial Number: 126350

## 2020-04-26 NOTE — Progress Notes (Signed)
Remote ICD transmission.   

## 2020-06-09 ENCOUNTER — Other Ambulatory Visit: Payer: Self-pay

## 2020-06-09 ENCOUNTER — Encounter: Payer: Self-pay | Admitting: Cardiovascular Disease

## 2020-06-09 ENCOUNTER — Ambulatory Visit: Payer: 59 | Admitting: Cardiovascular Disease

## 2020-06-09 VITALS — BP 124/70 | HR 77 | Ht 68.0 in | Wt 185.8 lb

## 2020-06-09 DIAGNOSIS — G4733 Obstructive sleep apnea (adult) (pediatric): Secondary | ICD-10-CM

## 2020-06-09 DIAGNOSIS — E118 Type 2 diabetes mellitus with unspecified complications: Secondary | ICD-10-CM

## 2020-06-09 DIAGNOSIS — E782 Mixed hyperlipidemia: Secondary | ICD-10-CM

## 2020-06-09 DIAGNOSIS — Z794 Long term (current) use of insulin: Secondary | ICD-10-CM

## 2020-06-09 DIAGNOSIS — I428 Other cardiomyopathies: Secondary | ICD-10-CM

## 2020-06-09 DIAGNOSIS — Z95810 Presence of automatic (implantable) cardiac defibrillator: Secondary | ICD-10-CM

## 2020-06-09 DIAGNOSIS — I10 Essential (primary) hypertension: Secondary | ICD-10-CM | POA: Diagnosis not present

## 2020-06-09 NOTE — Patient Instructions (Signed)
Medication Instructions:  Your Physician recommend you continue on your current medication as directed.    *If you need a refill on your cardiac medications before your next appointment, please call your pharmacy*   Lab Work: None ordered today   Testing/Procedures: None ordered today   Follow-Up: At [facility], you and your health needs are our priority.  As part of our continuing mission to provide you with exceptional heart care, we have created designated Provider Care Teams.  These Care Teams include your primary Cardiologist (physician) and Advanced Practice Providers (APPs -  Physician Assistants and Nurse Practitioners) who all work together to provide you with the care you need, when you need it.  We recommend signing up for the patient portal called "MyChart".  Sign up information is provided on this After Visit Summary.  MyChart is used to connect with patients for Virtual Visits (Telemedicine).  Patients are able to view lab/test results, encounter notes, upcoming appointments, etc.  Non-urgent messages can be sent to your provider as well.   To learn more about what you can do with MyChart, go to [email or web address].    Your next appointment:   3-4 month(s)  The format for your next appointment:   In Person  Provider:   [name], MD

## 2020-06-09 NOTE — Progress Notes (Signed)
Patient ID: Justin Ray, male   DOB: 1966/01/04, 55 y.o.   MRN: [id number]     Primary MD: Justin Ray  PATIENT PROFILE: Justin Ray is a 55 y.o. male who was initially referred through the courtesy of Justin Ray for cardiology care with me.  The patient had been previously followed in [place] for his cardiology care has also seen Justin Ray who follows him from an electrophysiologic standpoint with his history of cardiomyopathy and ICD implantation.  I last saw him in January 2022. He presents for 4 month  follow-up evaluation.    HPI:  Justin Ray has history of chronic systolic heart failure, class II and is felt to have a nonischemic cardiomyopathy.  He underwent insertion of an ICD in January 2006 and in November 2014 underwent ICD generator change by Justin Ray.  He has a history of anxiety/depression, type 2 diabetes mellitus on insulin, hyperlipidemia, and has continued have difficulty with elevated triglycerides.  He had been followed by the cardiology group in [place], but has developed recent discontent with his physician and is requested a new cardiology evaluation.  The patient has been on lisinopril 20 mg twice a day, digoxin 0.125 mg and carvedilol 25 mg twice a day for his coronary myopathy.  Hypertension.  He has been on Crestor 20 mg for hyperlipidemia.  An earlier this year he was started on placebo 2 capsules twice a day due to persistent elevated triglyceride levels.  His last lipid study 2 months ago showed a total cholesterol of 73, triglycerides 225, HDL 27, and EDL 45.  I suspect his LDL is inaccurate and this was recorded as 1.  He has a history of continued tobacco use, and started smoking at age 13 and currently is trying to quit but is still smoking.  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  He does not routinely exercise.  In 2004 he apparently underwent a sleep study and was told of having obstructive sleep apnea  but was never started on CPAP therapy.  Presently, he goes to bed about 1 AM and wakes up at 8 AM.  He often is up at least 2-4 times per night.  His sleep is nonrestorative.  He snores loudly.  He does note daytime sleepiness.  He is diabetic on insulin.  He has been taking Xanax as needed for anxiety.     He underwent a sleep study at the [facility] on 06/29/2015.  This study was consistent with upper airway resistance syndrome with an AHI of 2.6 per hour.  His mean oxygen saturation was 88.47% with a minimum oxygen desaturation to 85%.  There was evidence for soft snoring.  Of note, he was never able to achieve any supine sleep.    An Epworth Sleepiness Scale score was calculated today in the office and this endorsed at 9 with a moderate chance of dozing sitting and reading, watching TV, and while lying down to rest in the afternoon and circumstances persist.  He had a slight chance of dozing dozing while sitting inactive in a public place, as a passenger in a car, and sitting quietly after lunch.    I saw him in May 2018 and in January 2019.  He felt well from a cardiovascular standpoint and denies any chest pain or shortness of breath.  He saw Justin Ray in June 2018 and he was doing well.  Systolic heart failure symptoms had improved and were  now class I.  He has not had any recurrent ICD therapies but does have evidence for nonsustained ventricular tachycardia.  He has been undergoing physical therapy for shoulder and neck.  He underwent a follow-up echo Doppler study in October 2018, which now showed significant improvement in LV function with an EF of 50-55%.  Left atrium was mildly dilated.    I saw him in December 2020.  At that time he denied any chest pain or shortness of breath and admitted to a rare isolated palpitation lasting only seconds.  He was sleeping well.  He is followed closely by Justin Ray who checks his laboratory.  Hemoglobin A1c had improved to 6.6 on December 25, 2018.  There is no change in exercise tolerance.  He had recent laboratory in June 2020 which showed a total cholesterol 169, HDL 27, and triglycerides were 572.  He has been on Vascepa 2 capsules twice a day.  He continues to be on carvedilol 25 mg twice a day, digoxin 0.125 mg daily, HCTZ 25 mg in addition to lisinopril 20 mg daily for his blood pressure and prior heart failure.  He is diabetic on metformin and Victoza.    When I last saw him in August 2021 he continued  to be stable with reference to chest pain.  At times he does admit to leg swelling despite HCTZ 25 mg daily. Triglycerides in March 2021 were 569. Apparently insurance denied Vascepa.  He was not sleeping as well as he had in the past and admits to nocturia 4-5 times per night, snoring, frequent awakenings, awakening gasping for breath as well as daytime sleepiness.  During that evaluation, his sleep disordered breathing progressed and I recommended he under go a diagnostic polysomnogram.  On October 02, 2019 he underwent a follow-up echo Doppler study showed an EF of 55 to 60% with grade 1 diastolic dysfunction and mild aortic sclerosis without stenosis.  I last saw him in January 2022.  Justin Ray in-lab diagnostic polysomnogram was denied by his insurance.  He continued to sleep poorly and admits to frequent awakening, and is having nocturia at least 4-5 times per night.  He denies chest pain.  An Epworth Sleepiness Scale score was calculated in the office today and this endorsed at 10 consistent with excessive daytime sleepiness.    He underwent a home sleep study on March 14, 2020 which demonstrated severe sleep apnea with an overall AHI of 35.6/h, and events were worse with supine sleep with an AHI of 41.0.  Since this was a home study, the severity during REM sleep could not be evaluated.  He had significant oxygen desaturation to a nadir of 80% and time spent less than 89% was 163.9 minutes.  He was denied an in lab CPAP  titration which was recommended.  Since I saw him, he is still waiting for his AutoPap machine to do auto titration.  Justin Ray will be his DME company.  Unfortunately due to the supply chain issues CPAP machines are being delayed typically over 3 to 5 months.  Hopefully he will be receiving his very soon.  He denies any chest pain or shortness of breath.  He is unaware of any ICD defibrillator discharge.  He continues to undergo every 66-monthremote defibrillator device checks.  He is on carvedilol 25 mg twice a day, digoxin 0.125 mg daily, furosemide 20 mg, lisinopril 20 mg which he takes twice a day.  He is on fenofibrate and low  vase a 2 capsules twice a day.  He is diabetic on insulin, Victoza and metformin.  He presents for reevaluation.   Past Ray History:  Diagnosis Date  . AICD (automatic cardioverter/defibrillator) present   . Anxiety   . Cancer ([place])    skin cancer  . Cardiomyopathy   . Carpal tunnel syndrome 10/14   right-GSC  . Cataract   . CHF (congestive heart failure) ([place])   . Depression   . Diabetes mellitus    TYPE 2 , DIAGNOSED AS AN ADULT   . Dyslipidemia   . Hypertension   . ICD (implantable cardioverter-defibrillator) in place   . Pneumonia OVER 10 YEARS AGO   . Sleep apnea    was tested many yr ago-does not use a cpap-  . Wears glasses     Past Surgical History:  Procedure Laterality Date  . ANTERIOR CERVICAL DECOMP/DISCECTOMY FUSION N/A 03/06/2017   Procedure: ANTERIOR CERVICAL DECOMPRESSION FUSION,CERVICAL 6-7 WITH INSTRUMENTATION AND ALLOGRAFT REQUESTED TIME 2.5 HRS;  Surgeon: Phylliss Bob, MD;  Location: [facility];  Service: Orthopedics;  Laterality: N/A;  ANTERIOR CERVICAL DECOMPRESSION FUSION, CERVICAL 6-7 WITH INSTRUMENTATION AND ALLOGRAFT REQUESTED TIME 2.5 HRS  . CARDIAC DEFIBRILLATOR PLACEMENT  02/01/2004  . CARDIAC DEFIBRILLATOR PLACEMENT  11/14   replaced with new one  . CARPAL TUNNEL RELEASE Left 12/19/2012   Procedure: LEFT CARPAL  TUNNEL RELEASE;  Surgeon: Roseanne Kaufman, MD;  Location: [facility];  Service: Orthopedics;  Laterality: Left;  . CARPAL TUNNEL RELEASE Right 02/1999  . EYE SURGERY Bilateral    cataract surgery with lens implants  . IMPLANTABLE CARDIOVERTER DEFIBRILLATOR (ICD) GENERATOR CHANGE N/A 11/21/2012   Procedure: ICD GENERATOR CHANGE;  Surgeon: Evans Lance, MD;  Location: [facility] CATH LAB;  Service: Cardiovascular;  Laterality: N/A;  . SHOULDER ARTHROSCOPY Left 07/03/2018   Procedure: ARTHROSCOPY SHOULDER;  Surgeon: Tania Ade, MD;  Location: [facility];  Service: Orthopedics;  Laterality: Left;  . SHOULDER ARTHROSCOPY WITH SUBACROMIAL DECOMPRESSION AND BICEP TENDON REPAIR Left 07/03/2018   Procedure: SHOULDER ARTHROSCOPY WITH SUBACROMIAL DECOMPRESSION  BICEP TENODESIS;  Surgeon: Tania Ade, MD;  Location: [facility];  Service: Orthopedics;  Laterality: Left;  . TONSILLECTOMY      Allergies  Allergen Reactions  . Codeine Shortness Of Breath and Other (See Comments)    Severe HA's.  . Niaspan [Niacin Er] Other (See Comments)    Per pt feels like skin is burning( REDNESS), and pins/needles are sticking   . Sulfonamide Derivatives Other (See Comments)    thrush    Current Outpatient Medications  Medication Sig Dispense Refill  . ARIPiprazole (ABILIFY) 2 MG tablet Take 2 mg by mouth daily.    Marland Kitchen aspirin 81 MG tablet Take 81 mg by mouth daily.     . BD INSULIN SYRINGE U/F 31G X 5/16" 1 ML MISC 2 (two) times daily. as directed    . BD PEN NEEDLE NANO 2ND GEN 32G X 4 MM MISC See admin instructions.    . blood glucose meter kit and supplies KIT Test blood sugar 3 times daily. Dx code: E11.65 1 each 0  . carvedilol (COREG) 25 MG tablet take 1 tablet by mouth twice a day WITH A MEAL 180 tablet 1  . clonazePAM (KLONOPIN) 0.5 MG tablet Take 0.5 mg by mouth 2 (two) times daily as needed.    . desvenlafaxine (PRISTIQ) 100 MG 24 hr tablet TAKE 1 TABLET BY  MOUTH ONCE DAILY. 90 tablet 3  .  digoxin (LANOXIN) 0.125 MG tablet TAKE 1 TABLET BY MOUTH DAILY. 90 tablet 3  . fenofibrate 160 MG tablet Take 1 tablet (160 mg total) by mouth daily.    . hydrochlorothiazide (HYDRODIURIL) 25 MG tablet Take 25 mg by mouth daily.    . insulin NPH-regular Human (HUMULIN 70/30) (70-30) 100 UNIT/ML injection inject 65 units subcutaneously every morning and inject 50-55 units subcutaneously every evening 40 vial 11  . lisinopril (ZESTRIL) 20 MG tablet TAKE 1 TABLET BY MOUTH TWICE DAILY 180 tablet 2  . metFORMIN (GLUCOPHAGE-XR) 500 MG 24 hr tablet Take 1,000 mg by mouth 2 (two) times daily.   0  . omega-3 acid ethyl esters (LOVAZA) 1 g capsule Take 2 capsules by mouth 2 (two) times daily.    . sildenafil (VIAGRA) 100 MG tablet Take 100 mg by mouth daily as needed for erectile dysfunction.    Marland Kitchen VICTOZA 18 MG/3ML SOPN Inject 0.6 mg into the skin every morning.   0  . furosemide (LASIX) 20 MG tablet Take 1 tablet (20 mg total) by mouth daily. (Patient not taking: Reported on 06/09/2020) 90 tablet 3   No current facility-administered medications for this visit.    Social History   Socioeconomic History  . Marital status: Widowed    Spouse name: Not on file  . Number of children: 0  . Years of education: Not on file  . Highest education level: Not on file  Occupational History  . Occupation: maintenance    Employer: UNEMPLOYED  Tobacco Use  . Smoking status: Current Every Day Smoker    Packs/day: 1.50    Types: Cigarettes  . Smokeless tobacco: Never Used  Vaping Use  . Vaping Use: Never used  Substance and Sexual Activity  . Alcohol use: Yes    Comment: A SIX PACK EVERY SIX MONTHS   . Drug use: No  . Sexual activity: Never    Comment: number of sex partners in the last 12 months  0  Other Topics Concern  . Not on file  Social History Narrative  . Not on file   Social Determinants of Health   Financial Resource Strain: Not on file  Food Insecurity:  Not on file  Transportation Needs: Not on file  Physical Activity: Not on file  Stress: Not on file  Social Connections: Not on file  Intimate Partner Violence: Not on file   Additional social history is notable that he is widowed for 10 years.  He lives by himself.  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  He completed 12th grade of education.  He continues to smoke cigarettes and has been smoking since age 55.  He does drink occasional beer.  He walks intermittently outside work.  Family History  Problem Relation Age of Onset  . Coronary artery disease Father        CABG  . Cancer Mother        colon  . Heart disease Brother   . Cancer Other    Additional family history is that his mother died at 93 secondary to cancer.  His father is deceased and had undergone CABG revascularization surgery.  His 2 brothers, ages 35 and 1.  ROS General: Negative; No fevers, chills, or night sweats HEENT: Negative; No changes in vision or hearing, sinus congestion, difficulty swallowing Pulmonary: Negative; No cough, wheezing, shortness of breath, hemoptysis Cardiovascular:  See HPI; No chest pain, presyncope, syncope, palpitations, edema GI: Negative; No nausea, vomiting, diarrhea,  or abdominal pain GU: Negative; No dysuria, hematuria, or difficulty voiding Musculoskeletal: Negative; no myalgias, joint pain, or weakness Hematologic/Oncologic: Negative; no easy bruising, bleeding Endocrine: Negative; no heat/cold intolerance; no diabetes Neuro: Negative; no changes in balance, headaches Skin: Negative; No rashes or skin lesions Psychiatric: Positive for anxiety and depression Sleep:   Positive for snoring, awakening gasping for breath, frequent awakenings, and daytime sleepiness; No  bruxism, restless legs, hypnogagnic hallucinations Other comprehensive 14 point system review is negative   Physical Exam BP 124/70   Pulse 77   Ht 5' 8" (1.727 m)   Wt 185 lb 12.8 oz  (84.3 kg)   SpO2 95%   BMI 28.25 kg/m    Repeat blood pressure by me 120/70  Wt Readings from Last 3 Encounters:  06/09/20 185 lb 12.8 oz (84.3 kg)  03/14/20 179 lb (81.2 kg)  02/10/20 183 lb (83 kg)   General: Alert, oriented, no distress.  Skin: normal turgor, no rashes, warm and dry HEENT: Normocephalic, atraumatic. Pupils equal round and reactive to light; sclera anicteric; extraocular muscles intact;  Nose without nasal septal hypertrophy Mouth/Parynx benign; Mallinpatti scale 3 Neck: No JVD, no carotid bruits; normal carotid upstroke Lungs: clear to ausculatation and percussion; no wheezing or rales Chest wall: without tenderness to palpitation Heart: PMI not displaced, RRR, s1 s2 normal, 1/6 systolic murmur, no diastolic murmur, no rubs, gallops, thrills, or heaves Abdomen: soft, nontender; no hepatosplenomehaly, BS+; abdominal aorta nontender and not dilated by palpation. Back: no CVA tenderness Pulses 2+ Musculoskeletal: full range of motion, normal strength, no joint deformities Extremities: no clubbing cyanosis or edema, Homan's sign negative  Neurologic: grossly nonfocal; Cranial nerves grossly wnl Psychologic: Normal mood and affect   ECG (independently read by me): NSR at 77; no ectopy, normalintervals  January 2022 ECG (independently read by me): NSR at 77; QTc 427                August 2021 ECG (independently read by me): Normal sinus rhythm at 73 bpm.  No ectopy.  Normal intervals  December 29, 2018 ECG (independently read by me): Normal sinus rhythm at 89 bpm.  No ectopy.  Normal intervals.  January 2019 ECG (independently read by me): Normal sinus rhythm at 90 bpm.  Borderline LVH.  No ectopy.  Normal intervals.  May 2018 ECG (independently read by me):normal sinus rhythm at 69 bpm.  Normal intervals.  No ectopy.  April 2017 ECG (independently read by me): Normal sinus rhythm at 73 bpm.  Borderline voltage criteria for LVH.  LABS:  BMP Latest Ref Rng &  Units 06/27/2018 03/05/2017 10/23/2016  Glucose 70 - 99 mg/dL 121(H) 149(H) 107(H)  BUN 6 - 20 mg/dL _0 Creatinine 0.61 - 1.24 mg/dL 1.01 0.86 0.82  Sodium 135 - 145 mmol/L 136 140 137  Potassium 3.5 - 5.1 mmol/L 4.9 4.6 3.8  Chloride 98 - 111 mmol/L 105 107 107  CO2 22 - 32 mmol/L _1 Calcium 8.9 - 10.3 mg/dL 8.9 9.1 8.6(L)     Hepatic Function Latest Ref Rng & Units 03/05/2017 10/23/2016 05/24/2015  Total Protein 6.5 - 8.1 g/dL 6.9 7.3 6.9  Albumin 3.5 - 5.0 g/dL 4.0 3.9 4.3  AST 15 - 41 U/L 32 30 31  ALT 17 - 63 U/L 43 37 42  Alk Phosphatase 38 - 126 U/L 63 73 59  Total Bilirubin 0.3 - 1.2 mg/dL 0.6 0.5 0.7    CBC Latest Ref Rng &  Units 06/27/2018 03/05/2017 10/23/2016  WBC 4.0 - 10.5 K/uL 7.1 8.5 8.5  Hemoglobin 13.0 - 17.0 g/dL 17.7(H) 17.5(H) 16.6  Hematocrit 39.0 - 52.0 % 54.2(H) 50.7 47.9  Platelets 150 - 400 K/uL 175 151 177   Lab Results  Component Value Date   MCV 93.6 06/27/2018   MCV 91.0 03/05/2017   MCV 89.4 10/23/2016   Lab Results  Component Value Date   TSH 0.800 09/05/2011   Lab Results  Component Value Date   HGBA1C 6.7 (H) 03/05/2017     BNP    Component Value Date/Time   BNP 11.9 05/24/2015 1036    ProBNP No results found for: PROBNP   Lipid Panel     Component Value Date/Time   CHOL 83 (L) 05/06/2015 1723   TRIG 181 (H) 05/06/2015 1723   HDL 32 (L) 05/06/2015 1723   CHOLHDL 2.6 05/06/2015 1723   CHOLHDL 2.7 02/11/2015 0958   VLDL 45 (H) 02/11/2015 0958   LDLCALC 15 05/06/2015 1723    RADIOLOGY: No results found.  IMPRESSION:  No diagnosis found. ASSESSMENT AND PLAN: [name] is a 55 year-old gentleman who has a long-standing history of tobacco use. He underwent initial defibrillator insertion in 6546 for systolic heart failure and in 2014 underwent an ICD upgrade generator change.  An echo Doppler study in 2015 showed an ejection fraction at a ~ 45% with LVH and grade 1 diastolic dysfunction.  His  echo  Doppler study from October 2018  showed significant improvement in LV function with EF at 50-55%.  On his most recent echo Doppler study in September 2021 LV function remains normal at 55 to 60%.  He has not had any defibrillator discharges.  He is followed by Justin Ray for his defibrillator.  Presently, his blood pressure is stable on furosemide 20 mg, carvedilol 25 mg twice a day, and lisinopril 20 mg daily.  He continues to be on digoxin 0.125 mg. He is on fenofibrate and omega-3 fatty acids for mixed hyperlipidemia.  His home sleep confirmed severe sleep apnea with an AHI of 35.6/h.  Events were worse with supine sleep with an AHI of 41.0.  O2 nadir was 80% and time spent less than 89% was 163.9 minutes.  His in lab CPAP titration was denied by insurance but I suspect his sleep disordered breathing was even worse during REM sleep.  Unfortunately, due to supply chain issues, he has not yet received the CPAP machine for AutoPap titration.  Hopefully this will be happening in the very near future.  I will need to see him within 2 months of initiating AutoPap therapy at home. Justin Sine, MD, [facility] 06/09/2020 2:46 PM

## 2020-06-14 ENCOUNTER — Encounter: Payer: Self-pay | Admitting: Cardiovascular Disease

## 2020-07-14 ENCOUNTER — Ambulatory Visit (INDEPENDENT_AMBULATORY_CARE_PROVIDER_SITE_OTHER): Payer: 59

## 2020-07-14 DIAGNOSIS — I428 Other cardiomyopathies: Secondary | ICD-10-CM

## 2020-07-14 LAB — CUP PACEART REMOTE DEVICE CHECK
Battery Remaining Longevity: 54 mo
Battery Remaining Percentage: 60 %
Brady Statistic RV Percent Paced: 0 %
Date Time Interrogation Session: 20220630073300
HighPow Impedance: 65 Ohm
Implantable Lead Implant Date: 20060117
Implantable Lead Location: 753860
Implantable Lead Model: 185
Implantable Lead Serial Number: 124785
Implantable Pulse Generator Implant Date: 20141107
Lead Channel Impedance Value: 485 Ohm
Lead Channel Pacing Threshold Amplitude: 1.5 V
Lead Channel Pacing Threshold Pulse Width: 0.8 ms
Lead Channel Setting Pacing Amplitude: 2.8 V
Lead Channel Setting Pacing Pulse Width: 0.8 ms
Lead Channel Setting Sensing Sensitivity: 0.6 mV
Pulse Gen Serial Number: 126350

## 2020-08-02 NOTE — Progress Notes (Signed)
Remote ICD transmission.   

## 2020-08-11 ENCOUNTER — Telehealth: Payer: Self-pay | Admitting: Cardiovascular Disease

## 2020-08-11 NOTE — Telephone Encounter (Signed)
New Message:    Pt would  like to know what is the status of his C-Pap machine?

## 2020-08-28 ENCOUNTER — Other Ambulatory Visit: Payer: Self-pay | Admitting: Cardiovascular Disease

## 2020-09-07 NOTE — Telephone Encounter (Signed)
My chart message sent to patient in response to getting his CPAP machine.

## 2020-09-29 ENCOUNTER — Other Ambulatory Visit: Payer: Self-pay

## 2020-09-29 MED ORDER — DIGOXIN 125 MCG PO TABS: 125.0000 ug | ORAL_TABLET | Freq: Every day | ORAL | 0 refills | Status: DC

## 2020-10-13 ENCOUNTER — Ambulatory Visit (INDEPENDENT_AMBULATORY_CARE_PROVIDER_SITE_OTHER): Payer: 59

## 2020-10-13 DIAGNOSIS — I428 Other cardiomyopathies: Secondary | ICD-10-CM

## 2020-10-14 LAB — CUP PACEART REMOTE DEVICE CHECK
Battery Remaining Longevity: 54 mo
Battery Remaining Percentage: 56 %
Brady Statistic RV Percent Paced: 0 %
Date Time Interrogation Session: 20220930101100
HighPow Impedance: 67 Ohm
Implantable Lead Implant Date: 20060117
Implantable Lead Location: 753860
Implantable Lead Model: 185
Implantable Lead Serial Number: 124785
Implantable Pulse Generator Implant Date: 20141107
Lead Channel Impedance Value: 512 Ohm
Lead Channel Pacing Threshold Amplitude: 1.5 V
Lead Channel Pacing Threshold Pulse Width: 0.8 ms
Lead Channel Setting Pacing Amplitude: 2.8 V
Lead Channel Setting Pacing Pulse Width: 0.8 ms
Lead Channel Setting Sensing Sensitivity: 0.6 mV
Pulse Gen Serial Number: 126350

## 2020-10-20 NOTE — Progress Notes (Signed)
Remote ICD transmission.   

## 2020-11-21 ENCOUNTER — Ambulatory Visit: Payer: 59 | Admitting: Cardiovascular Disease

## 2020-11-23 ENCOUNTER — Other Ambulatory Visit: Payer: Self-pay

## 2020-11-23 ENCOUNTER — Ambulatory Visit (INDEPENDENT_AMBULATORY_CARE_PROVIDER_SITE_OTHER): Payer: 59 | Admitting: Internal Medicine

## 2020-11-23 VITALS — BP 110/62 | HR 94 | Ht 68.0 in | Wt 190.0 lb

## 2020-11-23 DIAGNOSIS — I5022 Chronic systolic (congestive) heart failure: Secondary | ICD-10-CM | POA: Diagnosis not present

## 2020-11-23 DIAGNOSIS — Z95810 Presence of automatic (implantable) cardiac defibrillator: Secondary | ICD-10-CM | POA: Diagnosis not present

## 2020-11-23 MED ORDER — LISINOPRIL 20 MG PO TABS: 20.0000 mg | ORAL_TABLET | Freq: Two times a day (BID) | ORAL | 3 refills | Status: DC

## 2020-11-23 MED ORDER — DIGOXIN 125 MCG PO TABS: 125.0000 ug | ORAL_TABLET | Freq: Every day | ORAL | 3 refills | Status: DC

## 2020-11-23 NOTE — Progress Notes (Signed)
HPI [name] returns today for followup. He is a pleasant 55 yo man with a h/o a non-ischemic CM, s/p ICD insertion, and HTN. He has done well in the interim. He has occaisional palpitations but no ICD therapies. He denies chest pain or sob. No edema. He is still working.  Allergies  Allergen Reactions   Codeine Shortness Of Breath and Other (See Comments)    Severe HA's.   Niaspan [Niacin Er] Other (See Comments)    Per pt feels like skin is burning( REDNESS), and pins/needles are sticking    Sulfonamide Derivatives Other (See Comments)    thrush     Current Outpatient Medications  Medication Sig Dispense Refill   ARIPiprazole (ABILIFY) 2 MG tablet Take 2 mg by mouth daily.     aspirin 81 MG tablet Take 81 mg by mouth daily.      BD INSULIN SYRINGE U/F 31G X 5/16" 1 ML MISC 2 (two) times daily. as directed     BD PEN NEEDLE NANO 2ND GEN 32G X 4 MM MISC See admin instructions.     blood glucose meter kit and supplies KIT Test blood sugar 3 times daily. Dx code: E11.65 1 each 0   carvedilol (COREG) 25 MG tablet take 1 tablet by mouth twice a day WITH A MEAL 180 tablet 1   clonazePAM (KLONOPIN) 0.5 MG tablet Take 0.5 mg by mouth 2 (two) times daily as needed.     desvenlafaxine (PRISTIQ) 100 MG 24 hr tablet TAKE 1 TABLET BY MOUTH ONCE DAILY. 90 tablet 3   fenofibrate 160 MG tablet Take 1 tablet (160 mg total) by mouth daily.     hydrochlorothiazide (HYDRODIURIL) 25 MG tablet Take 25 mg by mouth daily.     insulin NPH-regular Human (HUMULIN 70/30) (70-30) 100 UNIT/ML injection inject 65 units subcutaneously every morning and inject 50-55 units subcutaneously every evening 40 vial 11   metFORMIN (GLUCOPHAGE-XR) 500 MG 24 hr tablet Take 1,000 mg by mouth 2 (two) times daily.   0   omega-3 acid ethyl esters (LOVAZA) 1 g capsule Take 2 capsules by mouth 2 (two) times daily.     sildenafil (VIAGRA) 100 MG tablet Take 100 mg by mouth daily as needed for erectile dysfunction.      VICTOZA 18 MG/3ML SOPN Inject 0.6 mg into the skin every morning.   0   digoxin (LANOXIN) 0.125 MG tablet Take 1 tablet (125 mcg total) by mouth daily. 90 tablet 3   lisinopril (ZESTRIL) 20 MG tablet Take 1 tablet (20 mg total) by mouth 2 (two) times daily. 180 tablet 3   No current facility-administered medications for this visit.     Past Medical History:  Diagnosis Date   AICD (automatic cardioverter/defibrillator) present    Anxiety    Cancer ([place])    skin cancer   Cardiomyopathy    Carpal tunnel syndrome 10/14   right-GSC   Cataract    CHF (congestive heart failure) (HCC)    Depression    Diabetes mellitus    TYPE 2 , DIAGNOSED AS AN ADULT    Dyslipidemia    Hypertension    ICD (implantable cardioverter-defibrillator) in place    Pneumonia OVER 10 YEARS AGO    Sleep apnea    was tested many yr ago-does not use a cpap-   Wears glasses     ROS:   All systems reviewed and negative except as noted in the HPI.   Past  Surgical History:  Procedure Laterality Date   ANTERIOR CERVICAL DECOMP/DISCECTOMY FUSION N/A 03/06/2017   Procedure: ANTERIOR CERVICAL DECOMPRESSION FUSION,CERVICAL 6-7 WITH INSTRUMENTATION AND ALLOGRAFT REQUESTED TIME 2.5 HRS;  Surgeon: Phylliss Bob, MD;  Location: [facility];  Service: Orthopedics;  Laterality: N/A;  ANTERIOR CERVICAL DECOMPRESSION FUSION, CERVICAL 6-7 WITH INSTRUMENTATION AND ALLOGRAFT REQUESTED TIME 2.5 HRS   CARDIAC DEFIBRILLATOR PLACEMENT  02/01/2004   CARDIAC DEFIBRILLATOR PLACEMENT  11/14   replaced with new one   CARPAL TUNNEL RELEASE Left 12/19/2012   Procedure: LEFT CARPAL TUNNEL RELEASE;  Surgeon: Roseanne Kaufman, MD;  Location: [facility];  Service: Orthopedics;  Laterality: Left;   CARPAL TUNNEL RELEASE Right 02/1999   EYE SURGERY Bilateral    cataract surgery with lens implants   IMPLANTABLE CARDIOVERTER DEFIBRILLATOR (ICD) GENERATOR CHANGE N/A 11/21/2012   Procedure: ICD GENERATOR CHANGE;  Surgeon: Evans Lance,  MD;  Location: [facility] CATH LAB;  Service: Cardiovascular;  Laterality: N/A;   SHOULDER ARTHROSCOPY Left 07/03/2018   Procedure: ARTHROSCOPY SHOULDER;  Surgeon: Tania Ade, MD;  Location: [facility];  Service: Orthopedics;  Laterality: Left;   SHOULDER ARTHROSCOPY WITH SUBACROMIAL DECOMPRESSION AND BICEP TENDON REPAIR Left 07/03/2018   Procedure: SHOULDER ARTHROSCOPY WITH SUBACROMIAL DECOMPRESSION  BICEP TENODESIS;  Surgeon: Tania Ade, MD;  Location: [facility];  Service: Orthopedics;  Laterality: Left;   TONSILLECTOMY       Family History  Problem Relation Age of Onset   Coronary artery disease Father        CABG   Cancer Mother        colon   Heart disease Brother    Cancer Other      Social History   Socioeconomic History   Marital status: Widowed    Spouse name: Not on file   Number of children: 0   Years of education: Not on file   Highest education level: Not on file  Occupational History   Occupation: maintenance    Employer: UNEMPLOYED  Tobacco Use   Smoking status: Every Day    Packs/day: 1.50    Types: Cigarettes   Smokeless tobacco: Never  Vaping Use   Vaping Use: Never used  Substance and Sexual Activity   Alcohol use: Yes    Comment: A SIX PACK EVERY SIX MONTHS    Drug use: No   Sexual activity: Never    Comment: number of sex partners in the last 12 months  0  Other Topics Concern   Not on file  Social History Narrative   Not on file   Social Determinants of Health   Financial Resource Strain: Not on file  Food Insecurity: Not on file  Transportation Needs: Not on file  Physical Activity: Not on file  Stress: Not on file  Social Connections: Not on file  Intimate Partner Violence: Not on file     BP 110/62   Pulse 94   Ht $R'5\' 8"'VO$  (1.727 m)   Wt 190 lb (86.2 kg)   SpO2 93%   BMI 28.89 kg/m   Physical Exam:  Well appearing NAD HEENT: Unremarkable Neck:  No JVD, no thyromegally Lymphatics:  No  adenopathy Back:  No CVA tenderness Lungs:  Clear HEART:  Regular rate rhythm, no murmurs, no rubs, no clicks Abd:  soft, positive bowel sounds, no organomegally, no rebound, no guarding Ext:  2 plus pulses, no edema, no cyanosis, no clubbing Skin:  No rashes no nodules Neuro:  CN II through XII  intact, motor grossly intact  EKG  DEVICE  Normal device function.  See PaceArt for details.   Assess/Plan:  1. Chronic systolic heart failure - his symptoms remain class 2A. He will continue his current guideline directed medical therapy. 2. ICD - his  [employer] ICD is working normally with 4.5 years of longevity remaining.  3. HTN - his bp is normal. He will maintain a low sodium diet. 4. Tobacco abuse - he is down to a half  PPD.   Carleene Overlie Hoby Kawai,MD

## 2020-11-23 NOTE — Patient Instructions (Addendum)
Medication Instructions:  Your physician recommends that you continue on your current medications as directed. Please refer to the Current Medication list given to you today.  Labwork: None ordered.  Testing/Procedures: None ordered.  Follow-Up: Your physician wants you to follow-up in: one year with Legrand Como "Jonni Sanger" Chalmers Cater, PA-C  Remote monitoring is used to monitor your ICD from home. This monitoring reduces the number of office visits required to check your device to one time per year. It allows [place] to keep an eye on the functioning of your device to ensure it is working properly. You are scheduled for a device check from home on 01/12/2021. You may send your transmission at any time that day. If you have a wireless device, the transmission will be sent automatically. After your physician reviews your transmission, you will receive a postcard with your next transmission date.  Any Other Special Instructions Will Be Listed Below (If Applicable).  If you need a refill on your cardiac medications before your next appointment, please call your pharmacy.

## 2020-11-24 LAB — CUP PACEART INCLINIC DEVICE CHECK
Brady Statistic RV Percent Paced: 1 % — CL
Date Time Interrogation Session: 20221110170031
Implantable Lead Implant Date: 20060117
Implantable Lead Location: 753860
Implantable Lead Model: 185
Implantable Lead Serial Number: 124785
Implantable Pulse Generator Implant Date: 20141107
Lead Channel Pacing Threshold Amplitude: 1.6 V
Lead Channel Pacing Threshold Pulse Width: 0.8 ms
Lead Channel Sensing Intrinsic Amplitude: 12.9 mV
Pulse Gen Serial Number: 126350

## 2020-12-13 ENCOUNTER — Other Ambulatory Visit: Payer: Self-pay

## 2020-12-13 ENCOUNTER — Ambulatory Visit: Payer: 59 | Admitting: Cardiovascular Disease

## 2020-12-13 ENCOUNTER — Encounter: Payer: Self-pay | Admitting: Cardiovascular Disease

## 2020-12-13 VITALS — BP 144/74 | HR 85 | Ht 68.0 in | Wt 186.7 lb

## 2020-12-13 DIAGNOSIS — G4719 Other hypersomnia: Secondary | ICD-10-CM

## 2020-12-13 DIAGNOSIS — G4733 Obstructive sleep apnea (adult) (pediatric): Secondary | ICD-10-CM

## 2020-12-13 DIAGNOSIS — Z95810 Presence of automatic (implantable) cardiac defibrillator: Secondary | ICD-10-CM

## 2020-12-13 DIAGNOSIS — E782 Mixed hyperlipidemia: Secondary | ICD-10-CM

## 2020-12-13 DIAGNOSIS — I428 Other cardiomyopathies: Secondary | ICD-10-CM

## 2020-12-13 NOTE — Progress Notes (Signed)
Patient ID: Justin Ray, male   DOB: 1966/01/04, 55 y.o.   MRN: [id number]     Primary MD: Dr. Nona Dell  PATIENT PROFILE: Justin Ray is a 55 y.o. male who was initially referred through the courtesy of Dr. Laney Pastor for cardiology care with me.  The patient had been previously followed in [place] for his cardiology care has also seen Dr. Lovena Le who follows him from an electrophysiologic standpoint with his history of cardiomyopathy and ICD implantation.  I last saw him in January 2022. He presents for 4 month  follow-up evaluation.    HPI:  Justin Ray has history of chronic systolic heart failure, class II and is felt to have a nonischemic cardiomyopathy.  He underwent insertion of an ICD in January 2006 and in November 2014 underwent ICD generator change by Dr.Taylor.  He has a history of anxiety/depression, type 2 diabetes mellitus on insulin, hyperlipidemia, and has continued have difficulty with elevated triglycerides.  He had been followed by the cardiology group in [place], but has developed recent discontent with his physician and is requested a new cardiology evaluation.  The patient has been on lisinopril 20 mg twice a day, digoxin 0.125 mg and carvedilol 25 mg twice a day for his coronary myopathy.  Hypertension.  He has been on Crestor 20 mg for hyperlipidemia.  An earlier this year he was started on placebo 2 capsules twice a day due to persistent elevated triglyceride levels.  His last lipid study 2 months ago showed a total cholesterol of 73, triglycerides 225, HDL 27, and EDL 45.  I suspect his LDL is inaccurate and this was recorded as 1.  He has a history of continued tobacco use, and started smoking at age 13 and currently is trying to quit but is still smoking.  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  He does not routinely exercise.  In 2004 he apparently underwent a sleep study and was told of having obstructive sleep apnea  but was never started on CPAP therapy.  Presently, he goes to bed about 1 AM and wakes up at 8 AM.  He often is up at least 2-4 times per night.  His sleep is nonrestorative.  He snores loudly.  He does note daytime sleepiness.  He is diabetic on insulin.  He has been taking Xanax as needed for anxiety.     He underwent a sleep study at the [facility] on 06/29/2015.  This study was consistent with upper airway resistance syndrome with an AHI of 2.6 per hour.  His mean oxygen saturation was 88.47% with a minimum oxygen desaturation to 85%.  There was evidence for soft snoring.  Of note, he was never able to achieve any supine sleep.    An Epworth Sleepiness Scale score was calculated today in the office and this endorsed at 9 with a moderate chance of dozing sitting and reading, watching TV, and while lying down to rest in the afternoon and circumstances persist.  He had a slight chance of dozing dozing while sitting inactive in a public place, as a passenger in a car, and sitting quietly after lunch.    I saw him in May 2018 and in January 2019.  He felt well from a cardiovascular standpoint and denies any chest pain or shortness of breath.  He saw Dr. Lovena Le in June 2018 and he was doing well.  Systolic heart failure symptoms had improved and were  now class I.  He has not had any recurrent ICD therapies but does have evidence for nonsustained ventricular tachycardia.  He has been undergoing physical therapy for shoulder and neck.  He underwent a follow-up echo Doppler study in October 2018, which now showed significant improvement in LV function with an EF of 50-55%.  Left atrium was mildly dilated.    I saw him in December 2020.  At that time he denied any chest pain or shortness of breath and admitted to a rare isolated palpitation lasting only seconds.  He was sleeping well.  He is followed closely by Dr. Mellody Memos who checks his laboratory.  Hemoglobin A1c had improved to 6.6 on December 25, 2018.  There is no change in exercise tolerance.  He had recent laboratory in June 2020 which showed a total cholesterol 169, HDL 27, and triglycerides were 572.  He has been on Vascepa 2 capsules twice a day.  He continues to be on carvedilol 25 mg twice a day, digoxin 0.125 mg daily, HCTZ 25 mg in addition to lisinopril 20 mg daily for his blood pressure and prior heart failure.  He is diabetic on metformin and Victoza.    When I last saw him in August 2021 he continued  to be stable with reference to chest pain.  At times he does admit to leg swelling despite HCTZ 25 mg daily. Triglycerides in March 2021 were 569. Apparently insurance denied Vascepa.  He was not sleeping as well as he had in the past and admits to nocturia 4-5 times per night, snoring, frequent awakenings, awakening gasping for breath as well as daytime sleepiness.  During that evaluation, his sleep disordered breathing progressed and I recommended he under go a diagnostic polysomnogram.  On October 02, 2019 he underwent a follow-up echo Doppler study showed an EF of 55 to 60% with grade 1 diastolic dysfunction and mild aortic sclerosis without stenosis.  I saw him in January 2022.  Justin Ray in-lab diagnostic polysomnogram was denied by his insurance.  He continued to sleep poorly and admits to frequent awakening, and is having nocturia at least 4-5 times per night.  He denies chest pain.  An Epworth Sleepiness Scale score was calculated in the office today and this endorsed at 10 consistent with excessive daytime sleepiness.    He underwent a home sleep study on March 14, 2020 which demonstrated severe sleep apnea with an overall AHI of 35.6/h, and events were worse with supine sleep with an AHI of 41.0.  Since this was a home study, the severity during REM sleep could not be evaluated.  He had significant oxygen desaturation to a nadir of 80% and time spent less than 89% was 163.9 minutes.  He was denied an in lab CPAP  titration which was recommended.  I last saw him on Jun 09, 2020 which time he was still waiting for his AutoPap machine.   [facility] will be his DME company.  Unfortunately due to the supply chain issues CPAP machines are being delayed typically over 3 to 5 months.  Hopefully he will be receiving his very soon.  He denies any chest pain or shortness of breath.  He is unaware of any ICD defibrillator discharge.  He continues to undergo every 49-month remote defibrillator device checks.  He is on carvedilol 25 mg twice a day, digoxin 0.125 mg daily, furosemide 20 mg, lisinopril 20 mg which he takes twice a day.  He is on fenofibrate  and lovasa 2 capsules twice a day.  He is diabetic on insulin, Victoza and metformin.    Since I saw him he underwent follow-up evaluation with Dr. Lovena Le and has not had any ICD discharges.   Justin Ray received a new ResMed air sense 11 AutoSet CPAP unit November 11, 2020.  Since initiating therapy he notes that his sleep has significantly proved.  He typically works the second shift from 3 PM until midnight.  He often does not get to bed until 1 AM and usually gets up around 7 or 8 AM.  He has been using a full facemask.  I obtained a download from October 30 through December 12, 2020.  He is meeting compliance standards and it only missed therapy 2 days.  Sleep duration is 5 hours and 10 minutes.  His CPAP auto was set at a pressure range of 8 to 20 cm.  He does have mask leak.  AHI is excellent at 0.1.  95th percentile pressure was 8.7 with maximum average pressure 9.2.  He presents for evaluation.  Past Medical History:  Diagnosis Date   AICD (automatic cardioverter/defibrillator) present    Anxiety    Cancer ([place])    skin cancer   Cardiomyopathy    Carpal tunnel syndrome 10/14   right-GSC   Cataract    CHF (congestive heart failure) (HCC)    Depression    Diabetes mellitus    TYPE 2 , DIAGNOSED AS AN ADULT    Dyslipidemia    Hypertension    ICD  (implantable cardioverter-defibrillator) in place    Pneumonia OVER 10 YEARS AGO    Sleep apnea    was tested many yr ago-does not use a cpap-   Wears glasses     Past Surgical History:  Procedure Laterality Date   ANTERIOR CERVICAL DECOMP/DISCECTOMY FUSION N/A 03/06/2017   Procedure: ANTERIOR CERVICAL DECOMPRESSION FUSION,CERVICAL 6-7 WITH INSTRUMENTATION AND ALLOGRAFT REQUESTED TIME 2.5 HRS;  Surgeon: Phylliss Bob, MD;  Location: [place];  Service: Orthopedics;  Laterality: N/A;  ANTERIOR CERVICAL DECOMPRESSION FUSION, CERVICAL 6-7 WITH INSTRUMENTATION AND ALLOGRAFT REQUESTED TIME 2.5 HRS   CARDIAC DEFIBRILLATOR PLACEMENT  02/01/2004   CARDIAC DEFIBRILLATOR PLACEMENT  11/14   replaced with new one   CARPAL TUNNEL RELEASE Left 12/19/2012   Procedure: LEFT CARPAL TUNNEL RELEASE;  Surgeon: Roseanne Kaufman, MD;  Location: [place];  Service: Orthopedics;  Laterality: Left;   CARPAL TUNNEL RELEASE Right 02/1999   EYE SURGERY Bilateral    cataract surgery with lens implants   IMPLANTABLE CARDIOVERTER DEFIBRILLATOR (ICD) GENERATOR CHANGE N/A 11/21/2012   Procedure: ICD GENERATOR CHANGE;  Surgeon: Evans Lance, MD;  Location: [facility] C[facility] LAB;  Service: Cardiovascular;  Laterality: N/A;   SHOULDER ARTHROSCOPY Left 07/03/2018   Procedure: ARTHROSCOPY SHOULDER;  Surgeon: Tania Ade, MD;  Location: [facility];  Service: Orthopedics;  Laterality: Left;   SHOULDER ARTHROSCOPY WITH SUBACROMIAL DECOMPRESSION AND BICEP TENDON REPAIR Left 07/03/2018   Procedure: SHOULDER ARTHROSCOPY WITH SUBACROMIAL DECOMPRESSION  BICEP TENODESIS;  Surgeon: Tania Ade, MD;  Location: [place];  Service: Orthopedics;  Laterality: Left;   TONSILLECTOMY      Allergies  Allergen Reactions   Codeine Shortness Of Breath and Other (See Comments)    Severe HA's.   Niaspan [Niacin Er] Other (See Comments)    Per pt feels like skin is burning( REDNESS), and pins/needles  are sticking    Sulfonamide Derivatives Other (See Comments)    thrush  Current Outpatient Medications  Medication Sig Dispense Refill   ARIPiprazole (ABILIFY) 2 MG tablet Take 2 mg by mouth daily.     aspirin 81 MG tablet Take 81 mg by mouth daily.      BD INSULIN SYRINGE U/F 31G X 5/16" 1 ML MISC 2 (two) times daily. as directed     BD PEN NEEDLE NANO 2ND GEN 32G X 4 MM MISC See admin instructions.     blood glucose meter kit and supplies KIT Test blood sugar 3 times daily. Dx code: E11.65 1 each 0   carvedilol (COREG) 25 MG tablet take 1 tablet by mouth twice a day WITH A MEAL 180 tablet 1   clonazePAM (KLONOPIN) 0.5 MG tablet Take 0.5 mg by mouth 2 (two) times daily as needed.     desvenlafaxine (PRISTIQ) 100 MG 24 hr tablet TAKE 1 TABLET BY MOUTH ONCE DAILY. 90 tablet 3   digoxin (LANOXIN) 0.125 MG tablet Take 1 tablet (125 mcg total) by mouth daily. 90 tablet 3   fenofibrate 160 MG tablet Take 1 tablet (160 mg total) by mouth daily.     hydrochlorothiazide (HYDRODIURIL) 25 MG tablet Take 25 mg by mouth daily.     insulin NPH-regular Human (HUMULIN 70/30) (70-30) 100 UNIT/ML injection inject 65 units subcutaneously every morning and inject 50-55 units subcutaneously every evening 40 vial 11   lisinopril (ZESTRIL) 20 MG tablet Take 1 tablet (20 mg total) by mouth 2 (two) times daily. 180 tablet 3   metFORMIN (GLUCOPHAGE-XR) 500 MG 24 hr tablet Take 1,000 mg by mouth 2 (two) times daily.   0   omega-3 acid ethyl esters (LOVAZA) 1 g capsule Take 2 capsules by mouth 2 (two) times daily.     VICTOZA 18 MG/3ML SOPN Inject 0.6 mg into the skin every morning.   0   No current facility-administered medications for this visit.    Social History   Socioeconomic History   Marital status: Widowed    Spouse name: Not on file   Number of children: 0   Years of education: Not on file   Highest education level: Not on file  Occupational History   Occupation: maintenance    Employer:  UNEMPLOYED  Tobacco Use   Smoking status: Every Day    Packs/day: 1.50    Types: Cigarettes   Smokeless tobacco: Never  Vaping Use   Vaping Use: Never used  Substance and Sexual Activity   Alcohol use: Yes    Comment: A SIX PACK EVERY SIX MONTHS    Drug use: No   Sexual activity: Never    Comment: number of sex partners in the last 12 months  0  Other Topics Concern   Not on file  Social History Narrative   Not on file   Social Determinants of Health   Financial Resource Strain: Not on file  Food Insecurity: Not on file  Transportation Needs: Not on file  Physical Activity: Not on file  Stress: Not on file  Social Connections: Not on file  Intimate Partner Violence: Not on file   Additional social history is notable that he is widowed for 10 years.  He lives by himself.  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  He completed 12th grade of education.  He continues to smoke cigarettes and has been smoking since age 20.  He does drink occasional beer.  He walks intermittently outside work.  Family History  Problem Relation Age of Onset  Coronary artery disease Father        CABG   Cancer Mother        colon   Heart disease Brother    Cancer Other    Additional family history is that his mother died at 56 secondary to cancer.  His father is deceased and had undergone CABG revascularization surgery.  His 2 brothers, ages 17 and 47.  ROS General: Negative; No fevers, chills, or night sweats HEENT: Negative; No changes in vision or hearing, sinus congestion, difficulty swallowing Pulmonary: Negative; No cough, wheezing, shortness of breath, hemoptysis Cardiovascular:  See HPI; No chest pain, presyncope, syncope, palpitations, edema GI: Negative; No nausea, vomiting, diarrhea, or abdominal pain GU: Negative; No dysuria, hematuria, or difficulty voiding Musculoskeletal: Negative; no myalgias, joint pain, or weakness Hematologic/Oncologic: Negative;  no easy bruising, bleeding Endocrine: Negative; no heat/cold intolerance; no diabetes Neuro: Negative; no changes in balance, headaches Skin: Negative; No rashes or skin lesions Psychiatric: Positive for anxiety and depression Sleep:   Positive for snoring, awakening gasping for breath, frequent awakenings, and daytime sleepiness; recently started on CPAP therapy with significantly improved sleep;no  bruxism, restless legs, hypnogagnic hallucinations  An Epworth Sleepiness Scale score was calculated in the office today and this endorsed at 17 and is consistent with residual daytime sleepiness.    Epworth Sleepiness Scale: Situation   Chance of Dozing/Sleeping (0 = never , 1 = slight chance , 2 = moderate chance , 3 = high chance )   sitting and reading 3   watching TV 2   sitting inactive in a public place 2   being a passenger in a motor vehicle for an hour or more 3   lying down in the afternoon 3   sitting and talking to someone 2   sitting quietly after lunch (no alcohol) 2   while stopped for a few minutes in traffic as the driver 0   Total Score  17     Other comprehensive 14 point system review is negative   Physical Exam BP (!) 144/74   Pulse 85   Ht $R'5\' 8"'nU$  (1.727 m)   Wt 186 lb 11.2 oz (84.7 kg)   SpO2 99%   BMI 28.39 kg/m    Repeat blood pressure by me 120/70  Wt Readings from Last 3 Encounters:  12/13/20 186 lb 11.2 oz (84.7 kg)  11/23/20 190 lb (86.2 kg)  06/09/20 185 lb 12.8 oz (84.3 kg)    General: Alert, oriented, no distress.  Skin: normal turgor, no rashes, warm and dry HEENT: Normocephalic, atraumatic. Pupils equal round and reactive to light; sclera anicteric; extraocular muscles intact;  Nose without nasal septal hypertrophy Mouth/Parynx benign; Mallinpatti scale 3 Neck: No JVD, no carotid bruits; normal carotid upstroke Lungs: clear to ausculatation and percussion; no wheezing or rales Chest wall: without tenderness to palpitation Heart: PMI not  displaced, RRR, s1 s2 normal, 1/6 systolic murmur, no diastolic murmur, no rubs, gallops, thrills, or heaves Abdomen: soft, nontender; no hepatosplenomehaly, BS+; abdominal aorta nontender and not dilated by palpation. Back: no CVA tenderness Pulses 2+ Musculoskeletal: full range of motion, normal strength, no joint deformities Extremities: no clubbing cyanosis or edema, Homan's sign negative  Neurologic: grossly nonfocal; Cranial nerves grossly wnl Psychologic: Normal mood and affect     December 13, 2020  ECG (independently read by me): NSR at 85; no ectopy, normal inervals  Jun 09, 2020 ECG (independently read by me): NSR at 77; no ectopy, normalintervals  January 2022  ECG (independently read by me): NSR at 77; QTc 427                August 2021 ECG (independently read by me): Normal sinus rhythm at 73 bpm.  No ectopy.  Normal intervals  December 29, 2018 ECG (independently read by me): Normal sinus rhythm at 89 bpm.  No ectopy.  Normal intervals.  January 2019 ECG (independently read by me): Normal sinus rhythm at 90 bpm.  Borderline LVH.  No ectopy.  Normal intervals.  May 2018 ECG (independently read by me):normal sinus rhythm at 69 bpm.  Normal intervals.  No ectopy.  April 2017 ECG (independently read by me): Normal sinus rhythm at 73 bpm.  Borderline voltage criteria for LVH.  LABS:  BMP Latest Ref Rng & Units 06/27/2018 03/05/2017 10/23/2016  Glucose 70 - 99 mg/dL 121(H) 149(H) 107(H)  BUN 6 - 20 mg/dL $Remove'17 14 20  'IvQdJmS$ Creatinine 0.61 - 1.24 mg/dL 1.01 0.86 0.82  Sodium 135 - 145 mmol/L 136 140 137  Potassium 3.5 - 5.1 mmol/L 4.9 4.6 3.8  Chloride 98 - 111 mmol/L 105 107 107  CO2 22 - 32 mmol/L $RemoveB'24 22 22  'HaEApaZc$ Calcium 8.9 - 10.3 mg/dL 8.9 9.1 8.6(L)     Hepatic Function Latest Ref Rng & Units 03/05/2017 10/23/2016 05/24/2015  Total Protein 6.5 - 8.1 g/dL 6.9 7.3 6.9  Albumin 3.5 - 5.0 g/dL 4.0 3.9 4.3  AST 15 - 41 U/L 32 30 31  ALT 17 - 63 U/L 43 37 42  Alk Phosphatase 38 - 126  U/L 63 73 59  Total Bilirubin 0.3 - 1.2 mg/dL 0.6 0.5 0.7    CBC Latest Ref Rng & Units 06/27/2018 03/05/2017 10/23/2016  WBC 4.0 - 10.5 K/uL 7.1 8.5 8.5  Hemoglobin 13.0 - 17.0 g/dL 17.7(H) 17.5(H) 16.6  Hematocrit 39.0 - 52.0 % 54.2(H) 50.7 47.9  Platelets 150 - 400 K/uL 175 151 177   Lab Results  Component Value Date   MCV 93.6 06/27/2018   MCV 91.0 03/05/2017   MCV 89.4 10/23/2016   Lab Results  Component Value Date   TSH 0.800 09/05/2011   Lab Results  Component Value Date   HGBA1C 6.7 (H) 03/05/2017     BNP    Component Value Date/Time   BNP 11.9 05/24/2015 1036    ProBNP No results found for: PROBNP   Lipid Panel     Component Value Date/Time   CHOL 83 (L) 05/06/2015 1723   TRIG 181 (H) 05/06/2015 1723   HDL 32 (L) 05/06/2015 1723   CHOLHDL 2.6 05/06/2015 1723   CHOLHDL 2.7 02/11/2015 0958   VLDL 45 (H) 02/11/2015 0958   LDLCALC 15 05/06/2015 1723    RADIOLOGY: CUP PACEART INCLINIC DEVICE CHECK  Result Date: 11/24/2020 ICD check in clinic. Normal device function. Threshold and sensing consistent with previous device measurements. Impedance trends stable over time. No mode switches. 2 non-sustained VT episodes, V rates 168-192bopm, morphology matches that of slower VS rhythm.  Histogram distribution appropriate for patient and level of activity. No changes made this session. Device programmed at appropriate safety margins. Device programmed to optimize intrinsic conduction. Estimated longevity 4.5 years. Latitude 01/12/2021. ROV with EPP APP in 12 monthsElizabeth Watts BSN,RN,CCDS   IMPRESSION:  1. OSA (obstructive sleep apnea)   2. NICM (nonischemic cardiomyopathy) ([place])   3. Automatic implantable cardioverter-defibrillator in situ   4. Hyperlipidemia, mixed   5. Excessive daytime sleepiness     ASSESSMENT AND PLAN: Justin Ray is a 55 year-old gentleman who has a long-standing history of tobacco use. He underwent initial defibrillator  insertion in 0689 for systolic heart failure and in 2014 underwent an ICD upgrade generator change.  An echo Doppler study in 2015 showed an ejection fraction at a ~ 45% with LVH and grade 1 diastolic dysfunction.  His  echo Doppler study from October 2018  showed significant improvement in LV function with EF at 50-55%.  On his most recent echo Doppler study in September 2021 LV function remains normal at 55 to 60%.  He has seen Dr. Lovena Le and has not had any defibrillator discharges.  His blood pressure today is stable and was 126/70.  He continues to be on carvedilol 25 mg twice a day, lisinopril 20 mg daily, HCTZ 25 mg.  He is on metformin, insulin and Victoza for his diabetes mellitus.  He also is on digoxin 0.125 mg daily.  He continues to be on aspirin fenofibrate in addition to Lovaza for his mixed hyperlipidemia.  From a sleep perspective, finally received a new ResMed air sense 11 CPAP AutoSet unit on November 11, 2020.  He notices significant improvement in his sleep quality.  On his current download from October 30 through December 12, 2020 he is meeting compliance standards.  However, his sleep duration although compliant is still suboptimal at only 5 hours and 10 minutes.  I discussed with him optimal sleep duration of 7 and hours.  His AHI is excellent at 0.1.  He has been using a fullface mask.  He required only a low pressure and his 95th percentile pressure is 8.7 cm.  Suggested changing him to an ResMed AirFit N 30i mask which I believe he will tolerate much better and should hopefully significantly improve his documented leak.  We will continue current therapy.  I will see him in 1 year for follow-up evaluation or sooner as needed.   Troy Sine, MD, [facility] 12/21/2020 6:40 PM

## 2020-12-13 NOTE — Patient Instructions (Signed)
Medication Instructions:  Your Physician recommend you continue on your current medication as directed.    *If you need a refill on your cardiac medications before your next appointment, please call your pharmacy*   Lab Work: None ordered today   Testing/Procedures: None ordered today   Follow-Up: At [name], you and your health needs are our priority.  As part of our continuing mission to provide you with exceptional heart care, we have created designated Provider Care Teams.  These Care Teams include your primary Cardiologist (physician) and Advanced Practice Providers (APPs -  Physician Assistants and Nurse Practitioners) who all work together to provide you with the care you need, when you need it.  We recommend signing up for the patient portal called "MyChart".  Sign up information is provided on this After Visit Summary.  MyChart is used to connect with patients for Virtual Visits (Telemedicine).  Patients are able to view lab/test results, encounter notes, upcoming appointments, etc.  Non-urgent messages can be sent to your provider as well.   To learn more about what you can do with MyChart, go to [email or web address].    Your next appointment:   1 year(s)  The format for your next appointment:   In Person  Provider:   Thomas Kelly, MD       

## 2020-12-21 ENCOUNTER — Encounter: Payer: Self-pay | Admitting: Cardiovascular Disease

## 2021-01-12 ENCOUNTER — Ambulatory Visit (INDEPENDENT_AMBULATORY_CARE_PROVIDER_SITE_OTHER): Payer: 59

## 2021-01-12 DIAGNOSIS — I428 Other cardiomyopathies: Secondary | ICD-10-CM | POA: Diagnosis not present

## 2021-01-15 LAB — CUP PACEART REMOTE DEVICE CHECK
Battery Remaining Longevity: 48 mo
Battery Remaining Percentage: 54 %
Brady Statistic RV Percent Paced: 0 %
Date Time Interrogation Session: 20221230133800
HighPow Impedance: 61 Ohm
Implantable Lead Implant Date: 20060117
Implantable Lead Location: 753860
Implantable Lead Model: 185
Implantable Lead Serial Number: 124785
Implantable Pulse Generator Implant Date: 20141107
Lead Channel Impedance Value: 479 Ohm
Lead Channel Pacing Threshold Amplitude: 1.6 V
Lead Channel Pacing Threshold Pulse Width: 0.8 ms
Lead Channel Setting Pacing Amplitude: 2.8 V
Lead Channel Setting Pacing Pulse Width: 0.8 ms
Lead Channel Setting Sensing Sensitivity: 0.6 mV
Pulse Gen Serial Number: 126350

## 2021-01-24 NOTE — Progress Notes (Signed)
Remote ICD transmission.   

## 2021-04-13 ENCOUNTER — Ambulatory Visit (INDEPENDENT_AMBULATORY_CARE_PROVIDER_SITE_OTHER): Payer: 59

## 2021-04-13 DIAGNOSIS — I428 Other cardiomyopathies: Secondary | ICD-10-CM

## 2021-04-14 LAB — CUP PACEART REMOTE DEVICE CHECK
Battery Remaining Longevity: 48 mo
Battery Remaining Percentage: 52 %
Brady Statistic RV Percent Paced: 0 %
Date Time Interrogation Session: 20230330235800
HighPow Impedance: 64 Ohm
Implantable Lead Implant Date: 20060117
Implantable Lead Location: 753860
Implantable Lead Model: 185
Implantable Lead Serial Number: 124785
Implantable Pulse Generator Implant Date: 20141107
Lead Channel Impedance Value: 469 Ohm
Lead Channel Pacing Threshold Amplitude: 1.6 V
Lead Channel Pacing Threshold Pulse Width: 0.8 ms
Lead Channel Setting Pacing Amplitude: 2.8 V
Lead Channel Setting Pacing Pulse Width: 0.8 ms
Lead Channel Setting Sensing Sensitivity: 0.6 mV
Pulse Gen Serial Number: 126350

## 2021-04-25 NOTE — Progress Notes (Signed)
Remote ICD transmission.   

## 2021-07-13 ENCOUNTER — Ambulatory Visit (INDEPENDENT_AMBULATORY_CARE_PROVIDER_SITE_OTHER): Payer: 59

## 2021-07-13 DIAGNOSIS — I428 Other cardiomyopathies: Secondary | ICD-10-CM | POA: Diagnosis not present

## 2021-07-16 LAB — CUP PACEART REMOTE DEVICE CHECK
Battery Remaining Longevity: 48 mo
Battery Remaining Longevity: 48 mo
Battery Remaining Percentage: 50 %
Battery Remaining Percentage: 50 %
Brady Statistic RV Percent Paced: 0 %
Brady Statistic RV Percent Paced: 0 %
Date Time Interrogation Session: 20230630124800
Date Time Interrogation Session: 20230701155600
HighPow Impedance: 66 Ohm
HighPow Impedance: 62 Ohm
Implantable Lead Implant Date: 20060117
Implantable Lead Implant Date: 20060117
Implantable Lead Location: 753860
Implantable Lead Location: 753860
Implantable Lead Model: 185
Implantable Lead Model: 185
Implantable Lead Serial Number: 124785
Implantable Lead Serial Number: 124785
Implantable Pulse Generator Implant Date: 20141107
Implantable Pulse Generator Implant Date: 20141107
Lead Channel Impedance Value: 533 Ohm
Lead Channel Impedance Value: 467 Ohm
Lead Channel Pacing Threshold Amplitude: 1.6 V
Lead Channel Pacing Threshold Amplitude: 1.6 V
Lead Channel Pacing Threshold Pulse Width: 0.8 ms
Lead Channel Pacing Threshold Pulse Width: 0.8 ms
Lead Channel Setting Pacing Amplitude: 2.8 V
Lead Channel Setting Pacing Amplitude: 2.8 V
Lead Channel Setting Pacing Pulse Width: 0.8 ms
Lead Channel Setting Pacing Pulse Width: 0.8 ms
Lead Channel Setting Sensing Sensitivity: 0.6 mV
Lead Channel Setting Sensing Sensitivity: 0.6 mV
Pulse Gen Serial Number: 126350
Pulse Gen Serial Number: 126350

## 2021-07-27 NOTE — Progress Notes (Signed)
Remote ICD transmission.   

## 2021-09-29 ENCOUNTER — Telehealth: Payer: Self-pay | Admitting: Internal Medicine

## 2021-09-29 MED ORDER — LISINOPRIL 20 MG PO TABS: 20.0000 mg | ORAL_TABLET | Freq: Two times a day (BID) | ORAL | 2 refills | Status: DC

## 2021-09-29 NOTE — Telephone Encounter (Signed)
Pt's medication was sent to pt's pharmacy as requested. Confirmation received.  °

## 2021-09-29 NOTE — Telephone Encounter (Signed)
*  STAT* If patient is at the pharmacy, call can be transferred to refill team.   1. Which medications need to be refilled? (please list name of each medication and dose if known) Lisinopril  2. Which pharmacy/location (including street and city if local pharmacy) is medication to be sent to?[facility] [address], [place]  3. Do they need a 30 day or 90 day supply? 60 days and refills

## 2021-10-12 ENCOUNTER — Ambulatory Visit (INDEPENDENT_AMBULATORY_CARE_PROVIDER_SITE_OTHER): Payer: 59

## 2021-10-12 DIAGNOSIS — I428 Other cardiomyopathies: Secondary | ICD-10-CM

## 2021-10-16 LAB — CUP PACEART REMOTE DEVICE CHECK
Battery Remaining Longevity: 42 mo
Battery Remaining Percentage: 46 %
Brady Statistic RV Percent Paced: 0 %
Date Time Interrogation Session: 20231001200000
HighPow Impedance: 67 Ohm
Implantable Lead Implant Date: 20060117
Implantable Lead Location: 753860
Implantable Lead Model: 185
Implantable Lead Serial Number: 124785
Implantable Pulse Generator Implant Date: 20141107
Lead Channel Impedance Value: 487 Ohm
Lead Channel Pacing Threshold Amplitude: 1.6 V
Lead Channel Pacing Threshold Pulse Width: 0.8 ms
Lead Channel Setting Pacing Amplitude: 2.8 V
Lead Channel Setting Pacing Pulse Width: 0.8 ms
Lead Channel Setting Sensing Sensitivity: 0.6 mV
Pulse Gen Serial Number: 126350

## 2021-10-18 NOTE — Progress Notes (Signed)
Remote ICD transmission.   

## 2021-11-16 NOTE — Progress Notes (Signed)
Electrophysiology Office Note Date: 11/21/2021  ID:  Justin Ray, DOB [birth year], MRN [id number]  PCP: Townsend Roger, MD Primary Cardiologist: [name], MD Electrophysiologist: Cristopher Peru, MD   CC: Routine ICD follow-up  Justin Ray is a 56 y.o. male seen today for Cristopher Peru, MD for routine electrophysiology followup. Since last being seen in our clinic the patient reports doing very well.  he denies chest pain, palpitations, dyspnea, PND, orthopnea, nausea, vomiting, dizziness, syncope, edema, weight gain, or early satiety.   He has not had ICD shocks.   Device History: [employer] ICD implanted 2006, gen change 2014 for CHF   Past Medical History:  Diagnosis Date   AICD (automatic cardioverter/defibrillator) present    Anxiety    Cancer ([employer])    skin cancer   Cardiomyopathy    Carpal tunnel syndrome 10/14   right-GSC   Cataract    CHF (congestive heart failure) ([place])    Depression    Diabetes mellitus    TYPE 2 , DIAGNOSED AS AN ADULT    Dyslipidemia    Hypertension    ICD (implantable cardioverter-defibrillator) in place    Pneumonia OVER 10 YEARS AGO    Sleep apnea    was tested many yr ago-does not use a cpap-   Wears glasses    Past Surgical History:  Procedure Laterality Date   ANTERIOR CERVICAL DECOMP/DISCECTOMY FUSION N/A 03/06/2017   Procedure: ANTERIOR CERVICAL DECOMPRESSION FUSION,CERVICAL 6-7 WITH INSTRUMENTATION AND ALLOGRAFT REQUESTED TIME 2.5 HRS;  Surgeon: Phylliss Bob, MD;  Location: [place];  Service: Orthopedics;  Laterality: N/A;  ANTERIOR CERVICAL DECOMPRESSION FUSION, CERVICAL 6-7 WITH INSTRUMENTATION AND ALLOGRAFT REQUESTED TIME 2.5 HRS   CARDIAC DEFIBRILLATOR PLACEMENT  02/01/2004   CARDIAC DEFIBRILLATOR PLACEMENT  11/14   replaced with new one   CARPAL TUNNEL RELEASE Left 12/19/2012   Procedure: LEFT CARPAL TUNNEL RELEASE;  Surgeon: Roseanne Kaufman, MD;  Location: [place];  Service:  Orthopedics;  Laterality: Left;   CARPAL TUNNEL RELEASE Right 02/1999   EYE SURGERY Bilateral    cataract surgery with lens implants   IMPLANTABLE CARDIOVERTER DEFIBRILLATOR (ICD) GENERATOR CHANGE N/A 11/21/2012   Procedure: ICD GENERATOR CHANGE;  Surgeon: Evans Lance, MD;  [facility] CATH LAB;  Service: Cardiovascular;  Laterality: N/A;   SHOULDER ARTHROSCOPY Left 07/03/2018   Procedure: ARTHROSCOPY SHOULDER;  Surgeon: Tania Ade, MD;  Location: [facility];  Service: Orthopedics;  Laterality: Left;   SHOULDER ARTHROSCOPY WITH SUBACROMIAL DECOMPRESSION AND BICEP TENDON REPAIR Left 07/03/2018   Procedure: SHOULDER ARTHROSCOPY WITH SUBACROMIAL DECOMPRESSION  BICEP TENODESIS;  Surgeon: Tania Ade, MD;  Location: [place];  Service: Orthopedics;  Laterality: Left;   TONSILLECTOMY      Current Outpatient Medications  Medication Sig Dispense Refill   ARIPiprazole (ABILIFY) 2 MG tablet Take 2 mg by mouth daily.     aspirin 81 MG tablet Take 81 mg by mouth daily.      BD INSULIN SYRINGE U/F 31G X 5/16" 1 ML MISC 2 (two) times daily. as directed     BD PEN NEEDLE NANO 2ND GEN 32G X 4 MM MISC See admin instructions.     blood glucose meter kit and supplies KIT Test blood sugar 3 times daily. Dx code: E11.65 1 each 0   carvedilol (COREG) 25 MG tablet take 1 tablet by mouth twice a day WITH A MEAL 180 tablet 1   clonazePAM (KLONOPIN) 0.5 MG tablet Take 0.5  mg by mouth 2 (two) times daily as needed.     desvenlafaxine (PRISTIQ) 100 MG 24 hr tablet TAKE 1 TABLET BY MOUTH ONCE DAILY. 90 tablet 3   digoxin (LANOXIN) 0.125 MG tablet Take 1 tablet (125 mcg total) by mouth daily. 90 tablet 3   fenofibrate 160 MG tablet Take 1 tablet (160 mg total) by mouth daily.     hydrochlorothiazide (HYDRODIURIL) 25 MG tablet Take 25 mg by mouth daily.     insulin NPH-regular Human (HUMULIN 70/30) (70-30) 100 UNIT/ML injection inject 65 units subcutaneously every morning and  inject 50-55 units subcutaneously every evening 40 vial 11   lisinopril (ZESTRIL) 20 MG tablet Take 1 tablet (20 mg total) by mouth 2 (two) times daily. 60 tablet 2   metFORMIN (GLUCOPHAGE-XR) 500 MG 24 hr tablet Take 1,000 mg by mouth 2 (two) times daily.   0   omega-3 acid ethyl esters (LOVAZA) 1 g capsule Take 2 capsules by mouth 2 (two) times daily.     VICTOZA 18 MG/3ML SOPN Inject 0.6 mg into the skin every morning.   0   No current facility-administered medications for this visit.    Allergies:   Codeine, Niaspan [niacin er], and Sulfonamide derivatives   Social History: Social History   Socioeconomic History   Marital status: Widowed    Spouse name: Not on file   Number of children: 0   Years of education: Not on file   Highest education level: Not on file  Occupational History   Occupation: maintenance    Employer: UNEMPLOYED  Tobacco Use   Smoking status: Every Day    Packs/day: 1.50    Types: Cigarettes   Smokeless tobacco: Never  Vaping Use   Vaping Use: Never used  Substance and Sexual Activity   Alcohol use: Yes    Comment: A SIX PACK EVERY SIX MONTHS    Drug use: No   Sexual activity: Never    Comment: number of sex partners in the last 12 months  0  Other Topics Concern   Not on file  Social History Narrative   Not on file   Social Determinants of Health   Financial Resource Strain: Not on file  Food Insecurity: Not on file  Transportation Needs: Not on file  Physical Activity: Not on file  Stress: Not on file  Social Connections: Not on file  Intimate Partner Violence: Not on file    Family History: Family History  Problem Relation Age of Onset   Coronary artery disease Father        CABG   Cancer Mother        colon   Heart disease Brother    Cancer Other     Review of Systems: All other systems reviewed and are otherwise negative except as noted above.   Physical Exam: Vitals:   11/21/21 1102  BP: 138/80  Pulse: 62  SpO2: 95%   Weight: 183 lb 12.8 oz (83.4 kg)  Height: _0  (1.727 m)     GEN- The patient is well appearing, alert and oriented x 3 today.   HEENT: normocephalic, atraumatic; sclera clear, conjunctiva pink; hearing intact; oropharynx clear; neck supple, no JVP Lymph- no cervical lymphadenopathy Lungs- Clear to ausculation bilaterally, normal work of breathing.  No wheezes, rales, rhonchi Heart- Regular  rate and rhythm, no murmurs, rubs or gallops, PMI not laterally displaced GI- soft, non-tender, non-distended, bowel sounds present, no hepatosplenomegaly Extremities- no clubbing or cyanosis. No peripheral edema;  DP/PT/radial pulses 2+ bilaterally MS- no significant deformity or atrophy Skin- warm and dry, no rash or lesion; ICD pocket well healed Psych- euthymic mood, full affect Neuro- strength and sensation are intact  ICD interrogation- reviewed in detail today,  See PACEART report  EKG:  EKG is ordered today. Personal review of EKG ordered today shows NSR at 62 bpm  Recent Labs: No results found for requested labs within last 365 days.   Wt Readings from Last 3 Encounters:  11/21/21 183 lb 12.8 oz (83.4 kg)  12/13/20 186 lb 11.2 oz (84.7 kg)  11/23/20 190 lb (86.2 kg)     Other studies Reviewed: Additional studies/ records that were reviewed today include: Previous EP office notes.   ECHO COMPLETE WO IMAGING ENHANCING AGENT 10/02/2019 1. Left ventricular ejection fraction, by estimation, is 55 to 60%. The left ventricle has normal function. The left ventricle has no regional wall motion abnormalities. Left ventricular diastolic parameters are consistent with Grade I diastolic dysfunction (impaired relaxation). The average left ventricular global longitudinal strain is -20.8 %. The global longitudinal strain is normal. 2. Right ventricular systolic function is normal. The right ventricular size is mildly enlarged. 3. The mitral valve is normal in structure. Trivial mitral valve  regurgitation. No evidence of mitral stenosis. 4. The aortic valve is tricuspid. Aortic valve regurgitation is not visualized. Mild aortic valve sclerosis is present, with no evidence of aortic valve stenosis. 5. The inferior vena cava is normal in size with greater than 50% respiratory variability, suggesting right atrial pressure of 3 mmHg.   Assessment and Plan:  1.  Chronic systolic dysfunction s/p [employer] single chamber ICD  euvolemic today Stable on an appropriate medical regimen Normal ICD function See P[facility] Art report No changes today With normalized EF and no CHF symptoms, stop digoxin.   2. HTN Stable on current regimen   3. Tobacco abuse Encouraged cessation  Current medicines are reviewed at length with the patient today.    Labs/ tests ordered today include:  Orders Placed This Encounter  Procedures   Basic metabolic panel   EKG 95-VDIX    Disposition:   Follow up with Dr. Lovena Le in 12 months   Signed, Shirley Friar, PA-C  11/21/2021 11:10 AM  [place] [address] [phone] (office) [phone] (fax)

## 2021-11-21 ENCOUNTER — Ambulatory Visit: Payer: 59 | Attending: Student | Admitting: Student

## 2021-11-21 ENCOUNTER — Encounter: Payer: Self-pay | Admitting: Student

## 2021-11-21 VITALS — BP 138/80 | HR 62 | Ht 68.0 in | Wt 183.8 lb

## 2021-11-21 DIAGNOSIS — I428 Other cardiomyopathies: Secondary | ICD-10-CM | POA: Diagnosis not present

## 2021-11-21 DIAGNOSIS — G4733 Obstructive sleep apnea (adult) (pediatric): Secondary | ICD-10-CM | POA: Diagnosis not present

## 2021-11-21 DIAGNOSIS — Z95810 Presence of automatic (implantable) cardiac defibrillator: Secondary | ICD-10-CM

## 2021-11-21 LAB — CUP PACEART INCLINIC DEVICE CHECK
Date Time Interrogation Session: 20231107112520
HighPow Impedance: 50 Ohm
HighPow Impedance: 61 Ohm
Implantable Lead Connection Status: 753985
Implantable Lead Implant Date: 20060117
Implantable Lead Location: 753860
Implantable Lead Model: 185
Implantable Lead Serial Number: 124785
Implantable Pulse Generator Implant Date: 20141107
Lead Channel Impedance Value: 473 Ohm
Lead Channel Pacing Threshold Amplitude: 1.6 V
Lead Channel Pacing Threshold Pulse Width: 0.8 ms
Lead Channel Sensing Intrinsic Amplitude: 11.1 mV
Lead Channel Setting Pacing Amplitude: 2.8 V
Lead Channel Setting Pacing Pulse Width: 0.8 ms
Lead Channel Setting Sensing Sensitivity: 0.6 mV
Pulse Gen Serial Number: 126350

## 2021-11-21 NOTE — Patient Instructions (Signed)
Medication Instructions:  Your physician has recommended you make the following change in your medication:   DISCONTINUE: Digoxin  *If you need a refill on your cardiac medications before your next appointment, please call your pharmacy*   Lab Work: TODAY: BMET  If you have labs (blood work) drawn today and your tests are completely normal, you will receive your results only by: [place] (if you have MyChart) OR A paper copy in the mail If you have any lab test that is abnormal or we need to change your treatment, we will call you to review the results.   Follow-Up: At [facility], you and your health needs are our priority.  As part of our continuing mission to provide you with exceptional heart care, we have created designated Provider Care Teams.  These Care Teams include your primary Cardiologist (physician) and Advanced Practice Providers (APPs -  Physician Assistants and Nurse Practitioners) who all work together to provide you with the care you need, when you need it.   Your next appointment:   1 year(s)  The format for your next appointment:   In Person  Provider:   Cristopher Peru, MD     Important Information About Sugar

## 2021-11-22 LAB — BASIC METABOLIC PANEL
BUN/Creatinine Ratio: 17 (ref 9–20)
BUN: 16 mg/dL (ref 6–24)
CO2: 24 mmol/L (ref 20–29)
Calcium: 9.1 mg/dL (ref 8.7–10.2)
Chloride: 107 mmol/L — ABNORMAL HIGH (ref 96–106)
Creatinine, Ser: 0.96 mg/dL (ref 0.76–1.27)
Glucose: 105 mg/dL — ABNORMAL HIGH (ref 70–99)
Potassium: 5 mmol/L (ref 3.5–5.2)
Sodium: 141 mmol/L (ref 134–144)
eGFR: 92 mL/min/{1.73_m2} (ref >59)

## 2021-12-05 ENCOUNTER — Other Ambulatory Visit: Payer: Self-pay

## 2021-12-05 MED ORDER — "BD INSULIN SYRINGE U/F 31G X 5/16"" 1 ML MISC": 3 refills | Status: DC

## 2021-12-19 ENCOUNTER — Other Ambulatory Visit: Payer: Self-pay

## 2021-12-20 ENCOUNTER — Ambulatory Visit: Payer: 59 | Admitting: Physician Assistant

## 2021-12-20 ENCOUNTER — Encounter: Payer: Self-pay | Admitting: Physician Assistant

## 2021-12-20 NOTE — Progress Notes (Signed)
This encounter was created in error - please disregard.

## 2022-01-01 ENCOUNTER — Ambulatory Visit: Payer: 59 | Attending: Physician Assistant | Admitting: Physician Assistant

## 2022-01-01 ENCOUNTER — Encounter: Payer: Self-pay | Admitting: Physician Assistant

## 2022-01-01 VITALS — BP 140/82 | HR 74 | Ht 68.0 in | Wt 180.4 lb

## 2022-01-01 DIAGNOSIS — E119 Type 2 diabetes mellitus without complications: Secondary | ICD-10-CM

## 2022-01-01 DIAGNOSIS — I428 Other cardiomyopathies: Secondary | ICD-10-CM

## 2022-01-01 DIAGNOSIS — Z95810 Presence of automatic (implantable) cardiac defibrillator: Secondary | ICD-10-CM

## 2022-01-01 DIAGNOSIS — E785 Hyperlipidemia, unspecified: Secondary | ICD-10-CM

## 2022-01-01 DIAGNOSIS — I10 Essential (primary) hypertension: Secondary | ICD-10-CM | POA: Diagnosis not present

## 2022-01-01 DIAGNOSIS — G4733 Obstructive sleep apnea (adult) (pediatric): Secondary | ICD-10-CM

## 2022-01-01 MED ORDER — ASPIRIN 81 MG PO TBEC: 81.0000 mg | DELAYED_RELEASE_TABLET | Freq: Every day | ORAL | 3 refills | Status: DC

## 2022-01-01 MED ORDER — LISINOPRIL 20 MG PO TABS: 20.0000 mg | ORAL_TABLET | Freq: Two times a day (BID) | ORAL | 3 refills | Status: DC

## 2022-01-01 NOTE — Progress Notes (Unsigned)
Cardiology Office Note:    Date:  01/03/2022   ID:  Justin Ray, DOB May 04, 1965, MRN [id number]  PCP:  Justin Ray, [place] [facility] Cardiologist:  Justin Majestic, MD Electrophysiologist:  Justin Peru, MD     Referring MD: Justin Ray, Justin Cornea, MD   Chief Complaint  Patient presents with   Follow-up    Seen for Justin Ray    History of Present Illness:    Justin Ray is a 56 y.o. male with a hx of NICM s/p ICD, OSA, HTN, HLD, and DM II on insulin.  Patient was previously followed by [facility] cardiologist however later established with Justin Ray.  He has a longstanding history of chronic systolic heart failure felt to be nonischemic in nature.  He underwent ICD implantation in January 2006 and had a generator change out in November 2014 by Dr. Lovena Ray.  He had a sleep study in June 2017 which revealed upper airway resistance syndrome with AHI of 2.6/h.  O2 saturation nadir 85%.  EF has improved to 50 to 55% by echocardiogram in October 2018.  He has a history of difficult to treat hypertriglyceridemia and has been taking Vascepa.  Heart monitor performed in March 2020 showed no evidence of atrial fibrillation no significant irregular rhythm.  Repeat echocardiogram in September 2021 showed EF 55 to 60%, grade 1 DD, mild aortic sclerosis without stenosis.  Patient was last seen by Justin Ray in November 2022 at which time he was doing well.   Patient presents today for follow-up.  He denies any recent exertional chest pain or worsening dyspnea.  He has not used CPAP since December of last year.  I spoke to our CPAP coordinator who recommended repeating sleep study due to noncompliance.  His PCP recently took him off of hydrochlorothiazide due to increased urination.  His blood pressure is understandably borderline elevated today, he is scheduled to he see his PCP on 01/18/2022 in order to adjust his blood pressure medication further.  I will defer to his PCP to adjust his  blood pressure medication.  Unfortunately, her daughter passed away after spending 2 weeks in the hospital last Friday due to massive stroke.  He is overwhelmed at this point.  Therefore I did not push for sleep study at this time.  I asked him to reconsider sleep study in the future if he decides to get back on this CPAP machine.  If he does end up having a repeat study, instead of follow-up in 1 year, he should follow-up in 20-month    Past Medical History:  Diagnosis Date   AICD (automatic cardioverter/defibrillator) present    Anxiety    [place]    [place] cancer   Cardiomyopathy    Carpal tunnel syndrome 10/14   right-GSC   Cataract    CHF (congestive heart failure) ([facility])    Depression    Diabetes mellitus    TYPE 2 , DIAGNOSED AS AN ADULT    Dyslipidemia    Hypertension    ICD (implantable cardioverter-defibrillator) in place    Pneumonia OVER 10 YEARS AGO    Sleep apnea    was tested many yr ago-does not use a cpap-   Wears glasses     Past Surgical History:  Procedure Laterality Date   ANTERIOR CERVICAL DECOMP/DISCECTOMY FUSION N/A 03/06/2017   Procedure: ANTERIOR CERVICAL DECOMPRESSION FUSION,CERVICAL 6-7 WITH INSTRUMENTATION AND ALLOGRAFT REQUESTED TIME 2.5 HRS;  Surgeon: DPhylliss Bob MD;  Location: [facility]  Service: Orthopedics;  Laterality: N/A;  ANTERIOR CERVICAL DECOMPRESSION FUSION, CERVICAL 6-7 WITH INSTRUMENTATION AND ALLOGRAFT REQUESTED TIME 2.5 HRS   CARDIAC DEFIBRILLATOR PLACEMENT  02/01/2004   CARDIAC DEFIBRILLATOR PLACEMENT  11/14   replaced with new one   CARPAL TUNNEL RELEASE Left 12/19/2012   Procedure: LEFT CARPAL TUNNEL RELEASE;  Surgeon: Roseanne Kaufman, MD;  Location: [place];  Service: Orthopedics;  Laterality: Left;   CARPAL TUNNEL RELEASE Right 02/1999   EYE SURGERY Bilateral    cataract surgery with lens implants   IMPLANTABLE CARDIOVERTER DEFIBRILLATOR (ICD) GENERATOR CHANGE N/A 11/21/2012   Procedure: ICD GENERATOR CHANGE;   Surgeon: Evans Lance, MD;  Location: [facility] CATH LAB;  Service: Cardiovascular;  Laterality: N/A;   SHOULDER ARTHROSCOPY Left 07/03/2018   Procedure: ARTHROSCOPY SHOULDER;  Surgeon: Tania Ade, MD;  Location: [facility];  Service: Orthopedics;  Laterality: Left;   SHOULDER ARTHROSCOPY WITH SUBACROMIAL DECOMPRESSION AND BICEP TENDON REPAIR Left 07/03/2018   Procedure: SHOULDER ARTHROSCOPY WITH SUBACROMIAL DECOMPRESSION  BICEP TENODESIS;  Surgeon: Tania Ade, MD;  Location: [place];  Service: Orthopedics;  Laterality: Left;   TONSILLECTOMY      Current Medications: Current Meds  Medication Sig   ARIPiprazole (ABILIFY) 2 MG tablet Take 2 mg by mouth daily.   aspirin 81 MG tablet Take 81 mg by mouth daily.    aspirin EC 81 MG tablet Take 1 tablet (81 mg total) by mouth daily. Swallow whole.   BD INSULIN SYRINGE U/F 31G X 5/16" 1 ML MISC as directed twice daily   BD PEN NEEDLE NANO 2ND GEN 32G X 4 MM MISC See admin instructions.   blood glucose meter kit and supplies KIT Test blood sugar 3 times daily. Dx code: E11.65   carvedilol (COREG) 25 MG tablet take 1 tablet by mouth twice a day WITH A MEAL   clonazePAM (KLONOPIN) 0.5 MG tablet Take 0.5 mg by mouth 2 (two) times daily as needed.   desvenlafaxine (PRISTIQ) 100 MG 24 hr tablet TAKE 1 TABLET BY MOUTH ONCE DAILY.   fenofibrate 160 MG tablet Take 1 tablet (160 mg total) by mouth daily.   insulin NPH-regular Human (HUMULIN 70/30) (70-30) 100 UNIT/ML injection inject 65 units subcutaneously every morning and inject 50-55 units subcutaneously every evening   metFORMIN (GLUCOPHAGE-XR) 500 MG 24 hr tablet Take 1,000 mg by mouth 2 (two) times daily.    omega-3 acid ethyl esters (LOVAZA) 1 g capsule Take 2 capsules by mouth 2 (two) times daily.   [DISCONTINUED] hydrochlorothiazide (HYDRODIURIL) 25 MG tablet Take 25 mg by mouth daily.   [DISCONTINUED] lisinopril (ZESTRIL) 20 MG tablet Take 1 tablet (20 mg  total) by mouth 2 (two) times daily.   [DISCONTINUED] VICTOZA 18 MG/3ML SOPN Inject 0.6 mg into the skin every morning.      Allergies:   Codeine, Niaspan [niacin er], and Sulfonamide derivatives   Social History   Socioeconomic History   Marital status: Widowed    Spouse name: Not on file   Number of children: 0   Years of education: Not on file   Highest education level: Not on file  Occupational History   Occupation: maintenance    Employer: UNEMPLOYED  Tobacco Use   Smoking status: Every Day    Packs/day: 1.50    Types: Cigarettes   Smokeless tobacco: Never  Vaping Use   Vaping Use: Never used  Substance and Sexual Activity   Alcohol use: Yes    Comment: A  SIX PACK EVERY SIX MONTHS    Drug use: No   Sexual activity: Never    Comment: number of sex partners in the last 12 months  0  Other Topics Concern   Not on file  Social History Narrative   Not on file   Social Determinants of Health   Financial Resource Strain: Not on file  Food Insecurity: Not on file  Transportation Needs: Not on file  Physical Activity: Not on file  Stress: Not on file  Social Connections: Not on file     Family History: The patient's family history includes Cancer in his mother and another family member; Coronary artery disease in his father; Heart disease in his brother.  ROS:   Please see the history of present illness.     All other systems reviewed and are negative.  EKGs/Labs/Other Studies Reviewed:    The following studies were reviewed today:  Echo 10/02/2019 1. Left ventricular ejection fraction, by estimation, is 55 to 60%. The  left ventricle has normal function. The left ventricle has no regional  wall motion abnormalities. Left ventricular diastolic parameters are  consistent with Grade I diastolic  dysfunction (impaired relaxation). The average left ventricular global  longitudinal strain is -20.8 %. The global longitudinal strain is normal.   2. Right ventricular  systolic function is normal. The right ventricular  size is mildly enlarged.   3. The mitral valve is normal in structure. Trivial mitral valve  regurgitation. No evidence of mitral stenosis.   4. The aortic valve is tricuspid. Aortic valve regurgitation is not  visualized. Mild aortic valve sclerosis is present, with no evidence of  aortic valve stenosis.   5. The inferior vena cava is normal in size with greater than 50%  respiratory variability, suggesting right atrial pressure of 3 mmHg.   EKG:  EKG is not ordered today.    Recent Labs: 11/21/2021: BUN 16; Creatinine, Ser 0.96; Potassium 5.0; Sodium 141  Recent Lipid Panel    Component Value Date/Time   CHOL 83 (L) 05/06/2015 1723   TRIG 181 (H) 05/06/2015 1723   HDL 32 (L) 05/06/2015 1723   CHOLHDL 2.6 05/06/2015 1723   CHOLHDL 2.7 02/11/2015 0958   VLDL 45 (H) 02/11/2015 0958   LDLCALC 15 05/06/2015 1723     Risk Assessment/Calculations:           Physical Exam:    VS:  BP (!) 140/82   Pulse 74   Ht _0  (1.727 m)   Wt 180 lb 6.4 oz (81.8 kg)   SpO2 97%   BMI 27.43 kg/m        Wt Readings from Last 3 Encounters:  01/01/22 180 lb 6.4 oz (81.8 kg)  12/20/21 179 lb 6.4 oz (81.4 kg)  11/21/21 183 lb 12.8 oz (83.4 kg)     GEN:  Well nourished, well developed in no acute distress HEENT: Normal NECK: No JVD; No carotid bruits LYMPHATICS: No lymphadenopathy CARDIAC: RRR, no murmurs, rubs, gallops RESPIRATORY:  Clear to auscultation without rales, wheezing or rhonchi  ABDOMEN: Soft, non-tender, non-distended MUSCULOSKELETAL:  No edema; No deformity  SKIN: Warm and dry NEUROLOGIC:  Alert and oriented x 3 PSYCHIATRIC:  Normal affect   ASSESSMENT:    1. NICM (nonischemic cardiomyopathy) ([place])   2. ICD (implantable cardioverter-defibrillator) in place   3. Essential hypertension   4. Hyperlipidemia LDL goal <70   5. Controlled type 2 diabetes mellitus without complication, without long-term current use of  insulin (  [place])   6. OSA (obstructive sleep apnea)    PLAN:    In order of problems listed above:  Nonischemic cardiomyopathy: EF improved to 55 to 60% on last echocardiogram in September 2021.  History of ICD: Followed by Dr. Lovena Ray.  Initially underwent ICD implantation in January 2006 and had a generator change out in November 2014.  Hypertension: Blood pressure elevated, his PCP recently took him off of hydrochlorothiazide due to increased urination.  He is scheduled to see his PCP in early January to adjust blood pressure medication.  Hyperlipidemia: On fenofibrate and Lovaza.  DM2: Managed by primary care provider  Obstructive sleep apnea: He has not used his CPAP machine in about a year.  He complained of increased frequency of urination at night which I suspect is unrelated to CPAP machine.  I discussed the case with our sleep study coordinator who recommended a repeat study.  Unfortunately, his daughter recently passed away due to massive stroke, therefore the patient is quite overwhelmed at this point.  I asked him to reconsider sleep study and let us know when he is ready to proceed.  If he does have a repeat sleep study, I would recommend a sooner follow-up in 6 months.           Medication Adjustments/Labs and Tests Ordered: Current medicines are reviewed at length with the patient today.  Concerns regarding medicines are outlined above.  No orders of the defined types were placed in this encounter.  Meds ordered this encounter  Medications   lisinopril (ZESTRIL) 20 MG tablet    Sig: Take 1 tablet (20 mg total) by mouth 2 (two) times daily.    Dispense:  180 tablet    Refill:  3   aspirin EC 81 MG tablet    Sig: Take 1 tablet (81 mg total) by mouth daily. Swallow whole.    Dispense:  90 tablet    Refill:  3    Patient Instructions  Medication Instructions:  No Changes *If you need a refill on your cardiac medications before your next appointment, please call your  pharmacy*   Lab Work: No Labs If you have labs (blood work) drawn today and your tests are completely normal, you will receive your results only by: [place] (if you have MyChart) OR A paper copy in the mail If you have any lab test that is abnormal or we need to change your treatment, we will call you to review the results.   Testing/Procedures: No Testing   Follow-Up: At [facility], you and your health needs are our priority.  As part of our continuing mission to provide you with exceptional heart care, we have created designated Provider Care Teams.  These Care Teams include your primary Cardiologist (physician) and Advanced Practice Providers (APPs -  Physician Assistants and Nurse Practitioners) who all work together to provide you with the care you need, when you need it.  We recommend signing up for the patient portal called "MyChart".  Sign up information is provided on this After Visit Summary.  MyChart is used to connect with patients for Virtual Visits (Telemedicine).  Patients are able to view lab/test results, encounter notes, upcoming appointments, etc.  Non-urgent messages can be sent to your provider as well.   To learn more about what you can do with MyChart, go to [email or web address].    Your next appointment:   1 year(s) ( If repeat sleep study needed schedule appointment for 6 months ).  The format for your next appointment:   In Person  Provider:   [name], MD     Signed, Almyra Deforest, [place]  01/03/2022 4:19 PM    [place]

## 2022-01-01 NOTE — Patient Instructions (Signed)
Medication Instructions:  No Changes *If you need a refill on your cardiac medications before your next appointment, please call your pharmacy*   Lab Work: No Labs If you have labs (blood work) drawn today and your tests are completely normal, you will receive your results only by: [place] (if you have MyChart) OR A paper copy in the mail If you have any lab test that is abnormal or we need to change your treatment, we will call you to review the results.   Testing/Procedures: No Testing   Follow-Up: At [facility], you and your health needs are our priority.  As part of our continuing mission to provide you with exceptional heart care, we have created designated Provider Care Teams.  These Care Teams include your primary Cardiologist (physician) and Advanced Practice Providers (APPs -  Physician Assistants and Nurse Practitioners) who all work together to provide you with the care you need, when you need it.  We recommend signing up for the patient portal called "MyChart".  Sign up information is provided on this After Visit Summary.  MyChart is used to connect with patients for Virtual Visits (Telemedicine).  Patients are able to view lab/test results, encounter notes, upcoming appointments, etc.  Non-urgent messages can be sent to your provider as well.   To learn more about what you can do with MyChart, go to [email or web address].    Your next appointment:   1 year(s) ( If repeat sleep study needed schedule appointment for 6 months ).  The format for your next appointment:   In Person  Provider:   [name], MD

## 2022-01-03 ENCOUNTER — Encounter: Payer: Self-pay | Admitting: Physician Assistant

## 2022-01-11 ENCOUNTER — Ambulatory Visit (INDEPENDENT_AMBULATORY_CARE_PROVIDER_SITE_OTHER): Payer: 59

## 2022-01-11 DIAGNOSIS — I428 Other cardiomyopathies: Secondary | ICD-10-CM

## 2022-01-11 LAB — CUP PACEART REMOTE DEVICE CHECK
Battery Remaining Longevity: 42 mo
Battery Remaining Percentage: 46 %
Brady Statistic RV Percent Paced: 0 %
Date Time Interrogation Session: 20231228083200
HighPow Impedance: 69 Ohm
Implantable Lead Connection Status: 753985
Implantable Lead Implant Date: 20060117
Implantable Lead Location: 753860
Implantable Lead Model: 185
Implantable Lead Serial Number: 124785
Implantable Pulse Generator Implant Date: 20141107
Lead Channel Impedance Value: 486 Ohm
Lead Channel Pacing Threshold Amplitude: 1.7 V
Lead Channel Pacing Threshold Pulse Width: 0.8 ms
Lead Channel Setting Pacing Amplitude: 2.8 V
Lead Channel Setting Pacing Pulse Width: 0.8 ms
Lead Channel Setting Sensing Sensitivity: 0.6 mV
Pulse Gen Serial Number: 126350

## 2022-01-17 ENCOUNTER — Encounter: Payer: Self-pay | Admitting: Internal Medicine

## 2022-01-17 ENCOUNTER — Ambulatory Visit: Payer: 59 | Admitting: Internal Medicine

## 2022-01-17 VITALS — BP 112/78 | HR 73 | Temp 98.2°F | Resp 16 | Ht 68.0 in | Wt 181.0 lb

## 2022-01-17 DIAGNOSIS — E113293 Type 2 diabetes mellitus with mild nonproliferative diabetic retinopathy without macular edema, bilateral: Secondary | ICD-10-CM

## 2022-01-17 LAB — HEMOGLOBIN A1C
Est. average glucose Bld gHb Est-mCnc: 117 mg/dL
Hgb A1c MFr Bld: 5.7 % — ABNORMAL HIGH (ref 4.8–5.6)

## 2022-01-17 NOTE — Progress Notes (Signed)
Office Visit  Subjective   Patient ID: Justin Ray   DOB: [birth year]   Age: 57 y.o.   MRN: [id number]   Chief Complaint Chief Complaint  Patient presents with   Follow-up    Diabetes     History of Present Illness The patient is a 57 year old Caucasian/White male who returns for a follow-up visit for his diabetes.  On his last visit, we had him discontinue his victoza as his A1c has been well controlled.  He is watching his diet but is not exercising as much.   Over the interim, he has not had any problems and he states he had one episode of hypoglycemia in the 60's where he did not eat.  He otherwise has not had any other episodes of hypoglycemia and has not had to use his G-voke pen.  He remains on metformin ER '2000mg'$  daily, humulin 70/30 with 65 Units in AM and 55 Units in PM.  He specifically denies unexplained abdominal pain, nausea or vomiting.  He states his FSBS are running between 70-130.   He states he tries to eat 3 meals per day but when he does not, his blood sugars can get low.  His last HgBA1c was done 3 months ago and was 5.6%.  He came in fasting today in anticipation of lab work.  He denies any diabetic neuropathy, nephropathy or cardiovascular disease.  He did have a dilated diabetic eye exam done on 11/27/2021 where he has mild diabetic retinopathy in both eyes that is improved per their notes.         Past Medical History Past Medical History:  Diagnosis Date   AICD (automatic cardioverter/defibrillator) present    Anxiety    Cancer ([place])    skin cancer   Cardiomyopathy    Carpal tunnel syndrome 10/14   right-GSC   Cataract    CHF (congestive heart failure) (HCC)    Depression    Diabetes mellitus    TYPE 2 , DIAGNOSED AS AN ADULT    Dyslipidemia    Hypertension    ICD (implantable cardioverter-defibrillator) in place    Pneumonia OVER 10 YEARS AGO    Sleep apnea    was tested many yr ago-does not use a cpap-   Wears glasses       Allergies Allergies  Allergen Reactions   Codeine Shortness Of Breath and Other (See Comments)    Severe HA's.   Niaspan [Niacin Er] Other (See Comments)    Per pt feels like skin is burning( REDNESS), and pins/needles are sticking    Sulfonamide Derivatives Other (See Comments)    thrush     Review of Systems Review of Systems  Constitutional:  Negative for chills and fever.  Eyes:  Negative for blurred vision and double vision.  Respiratory:  Negative for cough and shortness of breath.   Cardiovascular:  Negative for chest pain and palpitations.  Gastrointestinal:  Negative for abdominal pain, constipation, diarrhea, nausea and vomiting.  Genitourinary:  Negative for frequency.  Musculoskeletal:  Negative for myalgias.  Skin:  Negative for itching and rash.  Neurological:  Negative for dizziness, weakness and headaches.       Objective:    Vitals BP 112/78   Pulse 73   Temp 98.2 F (36.8 C)   Resp 16   Ht '5\' 8"'$  (1.727 m)   Wt 181 lb (82.1 kg)   SpO2 97%   BMI 27.52 kg/m    Physical Examination Physical Exam  Constitutional:      Appearance: Normal appearance. He is not ill-appearing.  Cardiovascular:     Rate and Rhythm: Normal rate and regular rhythm.     Pulses: Normal pulses.     Heart sounds: No murmur heard.    No friction rub. No gallop.  Pulmonary:     Effort: Pulmonary effort is normal. No respiratory distress.     Breath sounds: No wheezing, rhonchi or rales.  Abdominal:     General: Abdomen is flat. Bowel sounds are normal. There is no distension.     Palpations: Abdomen is soft.     Tenderness: There is no abdominal tenderness.  Musculoskeletal:     Right lower leg: No edema.     Left lower leg: No edema.  Skin:    General: Skin is warm and dry.     Findings: No rash.  Neurological:     General: No focal deficit present.     Mental Status: He is alert and oriented to person, place, and time.        Assessment & Plan:   Mild  nonproliferative diabetic retinopathy of both eyes without macular edema associated with type 2 diabetes mellitus ([place]) He is now off his victoza.  He denies any problems with hypoglycemia as long as he eats regularly.  We will recheck his HgBA1c to see if he can stay off the [place].    Return in about 3 months (around 04/18/2022) for annual.   Townsend Roger, MD

## 2022-01-17 NOTE — Assessment & Plan Note (Signed)
He is now off his victoza.  He denies any problems with hypoglycemia as long as he eats regularly.  We will recheck his HgBA1c to see if he can stay off the [place].

## 2022-01-23 ENCOUNTER — Other Ambulatory Visit: Payer: Self-pay

## 2022-01-23 MED ORDER — HUMULIN 70/30 (70-30) 100 UNIT/ML ~~LOC~~ SUSP: SUBCUTANEOUS | 0 refills | Status: DC

## 2022-01-29 NOTE — Progress Notes (Signed)
Remote ICD transmission.   

## 2022-02-12 NOTE — Progress Notes (Signed)
Patient called.  Patient aware.  His labs show his diabetes is controlled.

## 2022-02-13 ENCOUNTER — Other Ambulatory Visit: Payer: Self-pay | Admitting: Internal Medicine

## 2022-03-27 ENCOUNTER — Other Ambulatory Visit: Payer: Self-pay | Admitting: Internal Medicine

## 2022-04-10 ENCOUNTER — Other Ambulatory Visit: Payer: Self-pay | Admitting: Internal Medicine

## 2022-04-19 ENCOUNTER — Ambulatory Visit: Payer: 59 | Admitting: Internal Medicine

## 2022-04-19 ENCOUNTER — Encounter: Payer: Self-pay | Admitting: Internal Medicine

## 2022-04-19 VITALS — BP 148/88 | HR 74 | Temp 97.9°F | Resp 16 | Ht 68.0 in | Wt 181.4 lb

## 2022-04-19 DIAGNOSIS — I5022 Chronic systolic (congestive) heart failure: Secondary | ICD-10-CM | POA: Diagnosis not present

## 2022-04-19 DIAGNOSIS — Z87891 Personal history of nicotine dependence: Secondary | ICD-10-CM | POA: Diagnosis not present

## 2022-04-19 DIAGNOSIS — E113293 Type 2 diabetes mellitus with mild nonproliferative diabetic retinopathy without macular edema, bilateral: Secondary | ICD-10-CM

## 2022-04-19 DIAGNOSIS — F330 Major depressive disorder, recurrent, mild: Secondary | ICD-10-CM | POA: Insufficient documentation

## 2022-04-19 DIAGNOSIS — I428 Other cardiomyopathies: Secondary | ICD-10-CM | POA: Diagnosis not present

## 2022-04-19 DIAGNOSIS — Z95810 Presence of automatic (implantable) cardiac defibrillator: Secondary | ICD-10-CM | POA: Insufficient documentation

## 2022-04-19 DIAGNOSIS — Z0000 Encounter for general adult medical examination without abnormal findings: Secondary | ICD-10-CM | POA: Diagnosis not present

## 2022-04-19 DIAGNOSIS — E782 Mixed hyperlipidemia: Secondary | ICD-10-CM

## 2022-04-19 DIAGNOSIS — I10 Essential (primary) hypertension: Secondary | ICD-10-CM

## 2022-04-19 DIAGNOSIS — Z6827 Body mass index (BMI) 27.0-27.9, adult: Secondary | ICD-10-CM | POA: Diagnosis not present

## 2022-04-19 DIAGNOSIS — M4802 Spinal stenosis, cervical region: Secondary | ICD-10-CM

## 2022-04-19 DIAGNOSIS — I4891 Unspecified atrial fibrillation: Secondary | ICD-10-CM | POA: Insufficient documentation

## 2022-04-19 DIAGNOSIS — F411 Generalized anxiety disorder: Secondary | ICD-10-CM

## 2022-04-19 DIAGNOSIS — N400 Enlarged prostate without lower urinary tract symptoms: Secondary | ICD-10-CM

## 2022-04-19 DIAGNOSIS — J4489 Other specified chronic obstructive pulmonary disease: Secondary | ICD-10-CM

## 2022-04-19 DIAGNOSIS — F17210 Nicotine dependence, cigarettes, uncomplicated: Secondary | ICD-10-CM

## 2022-04-19 DIAGNOSIS — G4733 Obstructive sleep apnea (adult) (pediatric): Secondary | ICD-10-CM

## 2022-04-19 MED ORDER — HYDROCHLOROTHIAZIDE 25 MG PO TABS: 25.0000 mg | ORAL_TABLET | ORAL | 3 refills | Status: DC

## 2022-04-19 MED ORDER — GLUCOSE BLOOD VI STRP: ORAL_STRIP | 12 refills | Status: AC

## 2022-04-19 MED ORDER — HUMULIN 70/30 (70-30) 100 UNIT/ML ~~LOC~~ SUSP: SUBCUTANEOUS | 0 refills | Status: DC

## 2022-04-19 NOTE — Assessment & Plan Note (Signed)
His GAD is currently controlled.  We will continue his current medications.

## 2022-04-19 NOTE — Assessment & Plan Note (Signed)
I want him to eat healthy, exercise and lose weight. 

## 2022-04-19 NOTE — Assessment & Plan Note (Signed)
He has had neck surgery in the past.  He has no problems with pain or radiculopathy at this time.

## 2022-04-19 NOTE — Assessment & Plan Note (Signed)
He has seen cardiology and his cardiomyopathy has improved from his ECHO in 09/2019.  The patient shows no evidence of decompenstation.

## 2022-04-19 NOTE — Assessment & Plan Note (Signed)
I asked him to cut back and try to cut using cigarettes.

## 2022-04-19 NOTE — Progress Notes (Signed)
Office Visit  Subjective   Patient ID: Justin Ray   DOB: May 31, 1965   Age: 57 y.o.   MRN: [id number]   Chief Complaint Chief Complaint  Patient presents with   Annual Exam     History of Present Illness Justin Ray is a 57 year old Caucasian/White male who presents for his annual health maintenance exam. He is due for the following health maintenance studies: screening labs. This patient's past medical history AICD Placement, Anxiety Disorder, Generalized, Atrial Fibrillation, Basal Cell Carcinoma, COPD, Depression, Diabetes Mellitus, Type II, Diabetic Retinopathy, Hypercholesterolemia, Hypertension, Non-ischemic Cardiomyopathy, Obstructive Sleep Apnea, Systolic heart failure; chronic, Tobacco Abuse, and Ventricular Tachycardia.   The patient had a dilated diabetic eye exam on 11/27/2021 that showed non-proliferative diabetic retinopathy where they felt it was improving.  He denies any problems with his vision. His last colonoscopy was in 12/18/2019 which showed some adenomatous polyps, diverticular disease and int/ext hemorrhoids. They want to repeat his colonoscopy in 5 years. There is a family history of colon cancer. His prior colonoscopy was in 2016 and they found 2 polyps and at that time. He does have a history of BPH and was followed by urology who last saw him on 06/2019. They were following his PSA levels. He denies any problems with urination today. He does exercise daily by walking regularly. He does get yearly flu vaccines. He had a pneumovax 23 vaccine in 2017 and a shingrix vaccine in 10/2016. He has not had any of the COVID-19 vaccines. He has a family history of heart disease in his father and grandmother. He is on an ASA 81mg  daily.  The patient is a 57 year old Caucasian/White male who returns for a follow-up visit for his T2 diabetes.  This past year, we had him discontinue his victoza as his A1c has been well controlled.  He is watching his diet and has been walking for  exercise.  Over the interim, he has not had any problems and he states he had one episode of hypoglycemia this morning.  He did not take his insulin this morning.  He states he is not having hypoglycemia as often and he states he had no symptoms of hypoglycemia and he wonders if this was incorrect on his glucometer.  He has not had to use his G-voke pen since his last visit.  He remains on metformin ER 2000mg  daily, humulin 70/30 with 65 Units in AM and 55 Units in PM.  He specifically denies unexplained abdominal pain, nausea or vomiting.  He states his FSBS are running between 70-120 where he check his FSBS once a day.   He states he tries to eat 3 meals per day but when he does not, his blood sugars can get low.  His last HgBA1c was done 3 months ago and was 5.7%.  He came in fasting today in anticipation of lab work.  He denies any diabetic neuropathy, nephropathy or cardiovascular disease.  He did have a dilated diabetic eye exam done on 11/27/2021 where he has mild diabetic retinopathy in both eyes that is improved per their notes.    The patient is a 57 year old Caucasian/White male who presents for a follow-up evaluation of hypertension.    The patient has been checking his blood pressure at home. The patient's blood pressure has ranged systollically in the [id number]. The patient's current medications include: carvedilol 25mg  BID, and lisinopril 20mg  BID. The patient has been tolerating his medications well. The patient denies any  headache, visual changes, dizziness, lightheadness, chest pain, shortness of breath, weakness/numbness, and edema.  He reports there have been no other symptoms noted.  We stopped his HCTZ last year due to his dizziness with orthostatic hypotension.   Justin Ray returns today for routine followup on his cholesterol.  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  I did check his FLP 3 months ago and his TG was still elevated.  I asked him to eat  healthy, cut fats in his diet and exercise.  Overall, he states he is doing well and is without any complaints or problems at this time. He specifically denies abdominal pain, nausea, vomiting, diarrhea, myalgias, and fatigue. He remains on dietary management as well as the following cholesterol lowering medications crestor 10mg  daily, fenofibrate 160mg  daily, lovaza 2g BID.  He is fasting in anticipation for labs today.   We did see Justin Ray in 12/2020 for orthostatic hypotension where I discontinued his HCTZ and asked him to drink more fluids.  He has not had anymore problems with dizziness or his memory.  He had noted some dizziness and memory problems during that visit.    He states he did not have confusion but he had memory loss where he was forgetting simple things at work and he has also forgotten his granddaughter's name at times.  He does have some depression and anxiety where it has worsened a bit due to financial reasons.  There was no f/c, diarrhea, n/v, or other problems and no focal weakness/numbness, slurred speech or other problems.  He had dizziness multiple times during the day especially when he goes from sitting/lying position to standing but it also occurred when he wassjust sitting there.  His dizziness would last 5 min most of the time but sometimes it would occur for 15-20 min.  There was no chest pain, SOB, palpitations, diaphoresis, syncope or other problems.  When he started drinking more fluids, he felt his heart failure worsened so he has stopped drinking as much.  On followup, I did ask him to restart HCTZ 25mg  and to take this every other day but he states he ran out of his HCTZ a while ago.    This patient also has moderately severe chronic systolic CHF/non-ischemic cardiomyopathy which was diagnosed in 2006.   His previous ECHO was done in 2015 and this showed an EF of 45% with LVH and diastolic dysfunction.  He had a repeat ECHO done in 10/02/2019 where his LVEF improved to  55-60% with normal LV function without regional wall motion abnormalities but he did have Grade I diastolic dysfunction and no valvular abnormalities.  He had an AICD placed on 01/2004 due to ventricular tachycardia.  They interrogated his AICD in 12/2021 and this was functioning properly.  His last visit with cardiology was in 12/2021 and again they felt his EF improved with his ECHO from 09/2019.  They noted that he had not used his CPAP in about a year and they are setting him up to have a repeat sleep study.   Today, he denies any CP, palpitations, SOB, or edema. His CHF has been in a compensated state on medications as noted in the medication list. He has the following baseline symptoms: exertional dyspnea where he cannot go up an entire flight of stairs without SOB. He states if it is level ground he can "walk for a while without getting SOB". Specifically denied complaints: worsening orthopnea, worsening edema, and worsening exertional dyspnea.  The patient also returns for followup of his depression and anxiety.  He may also have some PTSD in addition to his depression and anxiety.   Today, he states his anxiety is mild and controlled but his depression is a bit worse than last year.  He remains on abilify 2mg  daily as an adjunctive med for his depression and he also remains on Pristiq 100mg  daily.  He is on clonazepam prn which he has not used for months.  Today, he states he thinks he medicines are doing him well and he does not want to go up on any medications.  The symptoms have been present for 20 years. He states alot of his depression and anxiety comes from the death of his wife in 2007-04-29. He has been on pristiq for a number of years.  He used to be on effexor. He denies any recent panic attacks. He denies additional symptoms, difficulty concentrating, difficulty performing routine daily activities, fatigue, extreme feelings of guilt, feelings of isolation, feelings of worthlessness, helpless  feeling, suicidal ideation, homicidal ideation, weight loss, insomnia, loss of appetite, social withdrawal, loss of interest in pleasurable activities, and out of control feelings. This patient feels that she is able to care for himself. There are no predisposing factors for depression or anxiety. He currently lives alone. He has no significant prior history of mental health disorders.    This patient also has atrial fibrillation diagnosed in 04/29/2002 and presents today for a status visit. He had SOB at that time and ended up with a heart catherization. He has had 2 defibrillator placements in the past. His atrial fibrillation is controlled with therapy as summarized in the medication list and previous notes. He is on coreg 25mg  BID. He was on digoxin but he states that cardiology took him off this in Apr 28, 2021.  Specifically denied complaints: chest pain, palpitations, TIAs, edema, and syncope. Interval history: none. Interval studies: none. Anticoagulation status: ASA 81mg  daily.    The patient is a 57 year old Caucasian/White male who presents with a mild COPD which was diagnosed in 04/28/04 and presents today for a status visit. In 04/28/04, he was having SOB and was sent to the pulmonologist who did spirometry and told him he probably had COPD but his heart was his problem (see below). They placed him on nebulization treatment at that time but he has not been on this for a while and denies any symptoms today. He was smoking 1.5- 2 ppd but had cut back down to 1 ppd of cigarettes.  This past year he has been smoking 1 ppd.  He started smoking at age 65. He has tried chantix in the past but this gave him nightmares. He also has tried nicotine patches in the past. He also has a history of OSA where he was snoring and not well rested in the morning. He was having frequent awakenings at night. He was sent for a sleep study in 2014-04-29 but this was nonconclusive and his cardiologist is thinking about repeating his sleep study.  He  has been noncompliant with his CPAP for the past year.  See PMH for summary of pulmonary history and lab reports for status of any previous PFTs: COPD and Tobacco Abuse. Comorbid conditions : active smoker., CHF., and atrial fibrillation. He has no baseline symptoms of COPD.  He has the following modifiable risk factors: smoking, CHF, sedentary lifestyle, and obesity. Specifically denied complaints: worsening exertional dyspnea, increased wheezing, productive cough, change in color of sputum,  pleuritic chest pain, purulent sputum, hemoptysis, and fever. Interval history: no healthcare visits with other providers since last seen in this office. Interval studies: none.   The patient also has a history of neck pain which was mild and he is not having any problems with this neck pain at this time.  He had left sided cervical radiculopathy with spinal stenosis of C6-C7.  He was having weakness in his arm/hand and underwent ACDF of C6-C7 in 02/2017 with neurosurgery. He still has some residual left hand weakness but this is improved after surgery.  He has occasional pain.  He also had a left rotator cuff tear that occurred at the same time he hurt his neck in 10/2016.  He had arthroscopic left shoulder repair on 07/03/2018.  Today, he states his neck is 100% better but his shoulder he has occasional pain.  He also has a history of basal cell carcinoma of his arm and head and ear.  He sees dermatology yearly and he states he has no skin lesions he is worried about.  His last visit in dermatology was less than 6 months ago.           Past Medical History Past Medical History:  Diagnosis Date   AICD (automatic cardioverter/defibrillator) present    Anxiety    Basal cell carcinoma    Carpal tunnel syndrome 10/2012   right-GSC   Cataract    Chronic systolic (congestive) heart failure    COPD (chronic obstructive pulmonary disease)    Depression    Diabetic retinopathy associated with type 2 diabetes  mellitus    Hyperlipidemia    Hypertension    ICD (implantable cardioverter-defibrillator) in place    Non-ischemic cardiomyopathy    OSA (obstructive sleep apnea)    Tobacco abuse    Ventricular tachycardia      Allergies Allergies  Allergen Reactions   Codeine Shortness Of Breath and Other (See Comments)    Severe HA's.   Niaspan [Niacin Er] Other (See Comments)    Per pt feels like skin is burning( REDNESS), and pins/needles are sticking    Sulfonamide Derivatives Other (See Comments)    thrush     Medications  Current Outpatient Medications:    ARIPiprazole (ABILIFY) 2 MG tablet, Take 2 mg by mouth daily., Disp: , Rfl:    aspirin 81 MG tablet, Take 81 mg by mouth daily. , Disp: , Rfl:    aspirin EC 81 MG tablet, Take 1 tablet (81 mg total) by mouth daily. Swallow whole., Disp: 90 tablet, Rfl: 3   BD INSULIN SYRINGE U/F 31G X 5/16" 1 ML MISC, as directed twice daily, Disp: 100 each, Rfl: 3   BD PEN NEEDLE NANO 2ND GEN 32G X 4 MM MISC, See admin instructions., Disp: , Rfl:    blood glucose meter kit and supplies KIT, Test blood sugar 3 times daily. Dx code: E11.65, Disp: 1 each, Rfl: 0   carvedilol (COREG) 25 MG tablet, TAKE 1 TABLET(25 MG) BY MOUTH TWICE DAILY WITH FOOD, Disp: 180 tablet, Rfl: 1   clonazePAM (KLONOPIN) 0.5 MG tablet, Take 0.5 mg by mouth 2 (two) times daily as needed., Disp: , Rfl:    desvenlafaxine (PRISTIQ) 100 MG 24 hr tablet, TAKE 1 TABLET BY MOUTH ONCE DAILY, Disp: 30 tablet, Rfl: 5   fenofibrate 160 MG tablet, TAKE 1 TABLET(160 MG) BY MOUTH EVERY DAY, Disp: 90 tablet, Rfl: 1   insulin NPH-regular Human (HUMULIN 70/30) (70-30) 100 UNIT/ML injection, INJECT 65 UNITS  UNDER THE SKIN EVERY MORNING AND 50 TO 55 UNITS EVERY EVENING, Disp: 40 mL, Rfl: 0   lisinopril (ZESTRIL) 20 MG tablet, Take 1 tablet (20 mg total) by mouth 2 (two) times daily., Disp: 180 tablet, Rfl: 3   metFORMIN (GLUCOPHAGE-XR) 500 MG 24 hr tablet, Take 1,000 mg by mouth 2 (two) times daily.  , Disp: , Rfl: 0   omega-3 acid ethyl esters (LOVAZA) 1 g capsule, Take 2 capsules by mouth 2 (two) times daily., Disp: , Rfl:    Review of Systems Review of Systems  Constitutional:  Negative for chills, fever, malaise/fatigue and weight loss.  HENT:  Negative for hearing loss and tinnitus.   Eyes:  Negative for blurred vision and double vision.  Respiratory:  Negative for cough, shortness of breath and wheezing.   Cardiovascular:  Negative for chest pain, palpitations and leg swelling.  Gastrointestinal:  Negative for abdominal pain, blood in stool, constipation, diarrhea, heartburn, melena, nausea and vomiting.  Genitourinary:  Negative for frequency and hematuria.  Musculoskeletal:  Negative for back pain, myalgias and neck pain.  Skin:  Negative for itching and rash.  Neurological:  Negative for dizziness, weakness and headaches.  Psychiatric/Behavioral:  Positive for depression. Negative for substance abuse and suicidal ideas. The patient is nervous/anxious.        Objective:    Vitals BP (!) 148/88   Pulse 74   Temp 97.9 F (36.6 C)   Resp 16   Ht 5\' 8"  (1.727 m)   Wt 181 lb 6.4 oz (82.3 kg)   SpO2 97%   BMI 27.58 kg/m    Physical Examination Physical Exam Constitutional:      Appearance: Normal appearance. He is not ill-appearing.  HENT:     Head: Normocephalic and atraumatic.     Right Ear: Tympanic membrane, ear canal and external ear normal.     Left Ear: Tympanic membrane, ear canal and external ear normal.     Nose: Nose normal. No congestion or rhinorrhea.     Mouth/Throat:     Mouth: Mucous membranes are moist.     Pharynx: Oropharynx is clear. No oropharyngeal exudate or posterior oropharyngeal erythema.  Eyes:     General: No scleral icterus.    Conjunctiva/sclera: Conjunctivae normal.     Pupils: Pupils are equal, round, and reactive to light.  Neck:     Vascular: No carotid bruit.  Cardiovascular:     Rate and Rhythm: Normal rate and regular  rhythm.     Pulses: Normal pulses.     Heart sounds: No murmur heard.    No friction rub. No gallop.  Pulmonary:     Effort: Pulmonary effort is normal. No respiratory distress.     Breath sounds: No wheezing, rhonchi or rales.  Abdominal:     General: Abdomen is flat. Bowel sounds are normal. There is no distension.     Palpations: Abdomen is soft.     Tenderness: There is no abdominal tenderness.  Musculoskeletal:     Cervical back: Neck supple. No tenderness.     Right lower leg: No edema.     Left lower leg: No edema.  Lymphadenopathy:     Cervical: No cervical adenopathy.  Skin:    General: Skin is warm and dry.     Findings: No rash.  Neurological:     General: No focal deficit present.     Mental Status: He is alert and oriented to person, place, and time.  Psychiatric:  Mood and Affect: Mood normal.        Behavior: Behavior normal.        Assessment & Plan:   NICM (nonischemic cardiomyopathy) ([place]) He has seen cardiology and his cardiomyopathy has improved from his ECHO in 09/2019.  The patient shows no evidence of decompenstation.  Essential hypertension His BP is somewhat elevated today but he ran out of his HCTZ months ago.  He had dizziness in 12/2020 where we had stopped his HCTZ but restarted it with every other day dosing in a subsequent visit.  His BP was controlled last year.  We will restart this medication.  Chronic systolic heart failure His ECHO in 2015 showed an EF of 45% but this improved on his ECHO from 09/2019 where his EF went to 55-60%.  There is no evidence of decompensation.  We will continue on his ACE-I, BB, and add back HCTZ.  Atrial fibrillation He has a history of A. Fib but this has been rate controlled. He was on digoxin but his cardiologist discontinued this.  He will continue on coreg and an ASA.  OSA (obstructive sleep apnea) Cardiology is setting him up for a sleep study where he is noncompliant with his CPAP.  I want him to  try to lose some weight as well.  COPD with asthma He does not use any inhalers and he continues to smoke.  I had a discussion about cutting back.  We will continue to monitor.  Mild nonproliferative diabetic retinopathy of both eyes without macular edema associated with type 2 diabetes mellitus (HCC) We did check his FSBS this morning and it was 48.  We gave him crackers and I asked him to go eat.  He states he is not having any symptoms.  I am going to cut back his humulin 70/30 from 65 Units in AM to 60 Units in AM and his 55 Units in PM down to 50 Units in the PM.  He does have a G-voke pen.  He states his hypoglycemia does not happen often but occurs if he does not eat.   I asked him to eat a snack before bed.  Mild episode of recurrent major depressive disorder He has mild recurrent major depression that is controlled.  He does not want to change any of his meds today.  Hyperlipidemia, mixed We will check a FLP on him.  He has probable familial hypercholesterolemia.  GAD (generalized anxiety disorder) His GAD is currently controlled.  We will continue his current medications.  Cigarette smoker I asked him to cut back and try to cut using cigarettes.  BMI 27.0-27.9,adult I want him to eat healthy, exercise and lose weight.  Cervical spinal stenosis He has had neck surgery in the past.  He has no problems with pain or radiculopathy at this time.  Automatic implantable cardioverter-defibrillator in situ His AICD was interrogated in 12/2021 and is managed by cardiology.  Annual physical exam Health maintenance discussed.  We will obtain some yearly labs.    Return in about 3 months (around 07/19/2022).   Townsend Roger, MD

## 2022-04-19 NOTE — Assessment & Plan Note (Signed)
His BP is somewhat elevated today but he ran out of his HCTZ months ago.  He had dizziness in 12/2020 where we had stopped his HCTZ but restarted it with every other day dosing in a subsequent visit.  His BP was controlled last year.  We will restart this medication.

## 2022-04-19 NOTE — Assessment & Plan Note (Signed)
His AICD was interrogated in 12/2021 and is managed by cardiology.

## 2022-04-19 NOTE — Assessment & Plan Note (Signed)
His ECHO in 2015 showed an EF of 45% but this improved on his ECHO from 09/2019 where his EF went to 55-60%.  There is no evidence of decompensation.  We will continue on his ACE-I, BB, and add back HCTZ.

## 2022-04-19 NOTE — Assessment & Plan Note (Signed)
He has a history of A. Fib but this has been rate controlled. He was on digoxin but his cardiologist discontinued this.  He will continue on coreg and an ASA.

## 2022-04-19 NOTE — Assessment & Plan Note (Signed)
He has mild recurrent major depression that is controlled.  He does not want to change any of his meds today.

## 2022-04-19 NOTE — Assessment & Plan Note (Signed)
We will check a FLP on him.  He has probable familial hypercholesterolemia.

## 2022-04-19 NOTE — Assessment & Plan Note (Signed)
Cardiology is setting him up for a sleep study where he is noncompliant with his CPAP.  I want him to try to lose some weight as well.

## 2022-04-19 NOTE — Assessment & Plan Note (Signed)
We did check his FSBS this morning and it was 48.  We gave him crackers and I asked him to go eat.  He states he is not having any symptoms.  I am going to cut back his humulin 70/30 from 65 Units in AM to 60 Units in AM and his 55 Units in PM down to 50 Units in the PM.  He does have a G-voke pen.  He states his hypoglycemia does not happen often but occurs if he does not eat.   I asked him to eat a snack before bed.

## 2022-04-19 NOTE — Assessment & Plan Note (Signed)
He does not use any inhalers and he continues to smoke.  I had a discussion about cutting back.  We will continue to monitor.

## 2022-04-19 NOTE — Assessment & Plan Note (Signed)
Health maintenance discussed.  We will obtain some yearly labs. 

## 2022-04-20 ENCOUNTER — Ambulatory Visit (INDEPENDENT_AMBULATORY_CARE_PROVIDER_SITE_OTHER): Payer: 59

## 2022-04-20 DIAGNOSIS — I428 Other cardiomyopathies: Secondary | ICD-10-CM

## 2022-04-20 LAB — CUP PACEART REMOTE DEVICE CHECK
Battery Remaining Longevity: 36 mo
Battery Remaining Percentage: 41 %
Brady Statistic RV Percent Paced: 0 %
Date Time Interrogation Session: 20240405051600
HighPow Impedance: 68 Ohm
Implantable Lead Connection Status: 753985
Implantable Lead Implant Date: 20060117
Implantable Lead Location: 753860
Implantable Lead Model: 185
Implantable Lead Serial Number: 124785
Implantable Pulse Generator Implant Date: 20141107
Lead Channel Impedance Value: 474 Ohm
Lead Channel Pacing Threshold Amplitude: 1.7 V
Lead Channel Pacing Threshold Pulse Width: 0.8 ms
Lead Channel Setting Pacing Amplitude: 2.8 V
Lead Channel Setting Pacing Pulse Width: 0.8 ms
Lead Channel Setting Sensing Sensitivity: 0.6 mV
Pulse Gen Serial Number: 126350

## 2022-04-20 LAB — CBC WITH DIFFERENTIAL/PLATELET
Basophils Absolute: 0.1 10*3/uL (ref 0.0–0.2)
Basos: 1 %
EOS (ABSOLUTE): 0.2 10*3/uL (ref 0.0–0.4)
Eos: 2 %
Hematocrit: 52.9 % — ABNORMAL HIGH (ref 37.5–51.0)
Hemoglobin: 17.4 g/dL (ref 13.0–17.7)
Immature Grans (Abs): 0.1 10*3/uL (ref 0.0–0.1)
Immature Granulocytes: 1 %
Lymphocytes Absolute: 2.1 10*3/uL (ref 0.7–3.1)
Lymphs: 21 %
MCH: 30 pg (ref 26.6–33.0)
MCHC: 32.9 g/dL (ref 31.5–35.7)
MCV: 91 fL (ref 79–97)
Monocytes Absolute: 0.8 10*3/uL (ref 0.1–0.9)
Monocytes: 8 %
Neutrophils Absolute: 6.7 10*3/uL (ref 1.4–7.0)
Neutrophils: 67 %
Platelets: 243 10*3/uL (ref 150–450)
RBC: 5.8 x10E6/uL (ref 4.14–5.80)
RDW: 13.3 % (ref 11.6–15.4)
WBC: 9.9 10*3/uL (ref 3.4–10.8)

## 2022-04-20 LAB — CMP14 + ANION GAP
ALT: 28 IU/L (ref 0–44)
AST: 25 IU/L (ref 0–40)
Albumin/Globulin Ratio: 1.8 (ref 1.2–2.2)
Albumin: 4.4 g/dL (ref 3.8–4.9)
Alkaline Phosphatase: 97 IU/L (ref 44–121)
Anion Gap: 12 mmol/L (ref 10.0–18.0)
BUN/Creatinine Ratio: 17 (ref 9–20)
BUN: 18 mg/dL (ref 6–24)
Bilirubin Total: 0.3 mg/dL (ref 0.0–1.2)
CO2: 23 mmol/L (ref 20–29)
Calcium: 9.1 mg/dL (ref 8.7–10.2)
Chloride: 107 mmol/L — ABNORMAL HIGH (ref 96–106)
Creatinine, Ser: 1.03 mg/dL (ref 0.76–1.27)
Globulin, Total: 2.5 g/dL (ref 1.5–4.5)
Glucose: 58 mg/dL — ABNORMAL LOW (ref 70–99)
Potassium: 5 mmol/L (ref 3.5–5.2)
Sodium: 142 mmol/L (ref 134–144)
Total Protein: 6.9 g/dL (ref 6.0–8.5)
eGFR: 84 mL/min/{1.73_m2} (ref >59)

## 2022-04-20 LAB — HEMOGLOBIN A1C
Est. average glucose Bld gHb Est-mCnc: 114 mg/dL
Hgb A1c MFr Bld: 5.6 % (ref 4.8–5.6)

## 2022-04-20 LAB — LIPID PANEL
Chol/HDL Ratio: 3.9 ratio (ref 0.0–5.0)
Cholesterol, Total: 131 mg/dL (ref 100–199)
HDL: 34 mg/dL — ABNORMAL LOW (ref >39)
LDL Chol Calc (NIH): 70 mg/dL (ref 0–99)
Triglycerides: 156 mg/dL — ABNORMAL HIGH (ref 0–149)
VLDL Cholesterol Cal: 27 mg/dL (ref 5–40)

## 2022-04-20 LAB — MICROALBUMIN / CREATININE URINE RATIO
Creatinine, Urine: 112.8 mg/dL
Microalb/Creat Ratio: 82 mg/g creat — ABNORMAL HIGH (ref 0–29)
Microalbumin, Urine: 92.1 ug/mL

## 2022-04-20 LAB — TSH: TSH: 2.29 u[IU]/mL (ref 0.450–4.500)

## 2022-04-20 LAB — PSA: Prostate Specific Ag, Serum: 0.8 ng/mL (ref 0.0–4.0)

## 2022-05-04 ENCOUNTER — Other Ambulatory Visit: Payer: Self-pay | Admitting: Internal Medicine

## 2022-05-04 NOTE — Telephone Encounter (Signed)
New dosage sent in at 04/19/22 appt

## 2022-05-07 ENCOUNTER — Other Ambulatory Visit: Payer: Self-pay

## 2022-05-07 MED ORDER — HUMULIN 70/30 (70-30) 100 UNIT/ML ~~LOC~~ SUSP: SUBCUTANEOUS | 0 refills | Status: DC

## 2022-05-11 ENCOUNTER — Other Ambulatory Visit: Payer: Self-pay | Admitting: Internal Medicine

## 2022-05-22 NOTE — Progress Notes (Signed)
Remote ICD transmission.   

## 2022-06-14 ENCOUNTER — Other Ambulatory Visit: Payer: Self-pay | Admitting: Internal Medicine

## 2022-07-17 LAB — CUP PACEART REMOTE DEVICE CHECK
Battery Remaining Longevity: 36 mo
Battery Remaining Percentage: 38 %
Brady Statistic RV Percent Paced: 0 %
Date Time Interrogation Session: 20240628131800
HighPow Impedance: 67 Ohm
Implantable Lead Connection Status: 753985
Implantable Lead Implant Date: 20060117
Implantable Lead Location: 753860
Implantable Lead Model: 185
Implantable Lead Serial Number: 124785
Implantable Pulse Generator Implant Date: 20141107
Lead Channel Impedance Value: 478 Ohm
Lead Channel Pacing Threshold Amplitude: 1.7 V
Lead Channel Pacing Threshold Pulse Width: 0.8 ms
Lead Channel Setting Pacing Amplitude: 2.8 V
Lead Channel Setting Pacing Pulse Width: 0.8 ms
Lead Channel Setting Sensing Sensitivity: 0.6 mV
Pulse Gen Serial Number: 126350

## 2022-07-18 ENCOUNTER — Encounter: Payer: Self-pay | Admitting: Internal Medicine

## 2022-07-18 ENCOUNTER — Other Ambulatory Visit: Payer: Self-pay | Admitting: Internal Medicine

## 2022-07-18 ENCOUNTER — Ambulatory Visit: Payer: 59 | Admitting: Internal Medicine

## 2022-07-18 VITALS — BP 130/80 | HR 69 | Temp 98.4°F | Resp 16 | Ht 68.0 in | Wt 185.2 lb

## 2022-07-18 DIAGNOSIS — J441 Chronic obstructive pulmonary disease with (acute) exacerbation: Secondary | ICD-10-CM

## 2022-07-18 DIAGNOSIS — R809 Proteinuria, unspecified: Secondary | ICD-10-CM | POA: Diagnosis not present

## 2022-07-18 DIAGNOSIS — E1129 Type 2 diabetes mellitus with other diabetic kidney complication: Secondary | ICD-10-CM | POA: Diagnosis not present

## 2022-07-18 DIAGNOSIS — I10 Essential (primary) hypertension: Secondary | ICD-10-CM | POA: Diagnosis not present

## 2022-07-18 LAB — HEMOGLOBIN A1C
Est. average glucose Bld gHb Est-mCnc: 126 mg/dL
Hgb A1c MFr Bld: 6 % — ABNORMAL HIGH (ref 4.8–5.6)

## 2022-07-18 MED ORDER — METHYLPREDNISOLONE SODIUM SUCC 40 MG IJ SOLR
80.0000 mg | Freq: Once | INTRAMUSCULAR | Status: AC
Start: 2022-07-18 — End: 2022-07-18
  Administered 2022-07-18: 80 mg via INTRAMUSCULAR

## 2022-07-18 MED ORDER — DAPAGLIFLOZIN PROPANEDIOL 5 MG PO TABS: 5.0000 mg | ORAL_TABLET | Freq: Every day | ORAL | 11 refills | Status: DC

## 2022-07-18 MED ORDER — METHYLPREDNISOLONE 4 MG PO TBPK
ORAL_TABLET | ORAL | 0 refills | Status: DC
Start: 2022-07-18 — End: 2023-01-18

## 2022-07-18 MED ORDER — COMBIVENT RESPIMAT 20-100 MCG/ACT IN AERS
1.0000 | INHALATION_SPRAY | Freq: Four times a day (QID) | RESPIRATORY_TRACT | 1 refills | Status: DC
Start: 2022-07-18 — End: 2022-09-24

## 2022-07-18 MED ORDER — IPRATROPIUM-ALBUTEROL 0.5-2.5 (3) MG/3ML IN SOLN
3.0000 mL | Freq: Once | RESPIRATORY_TRACT | Status: AC
Start: 2022-07-18 — End: 2022-07-18
  Administered 2022-07-18: 3 mL via RESPIRATORY_TRACT

## 2022-07-18 NOTE — Assessment & Plan Note (Addendum)
He is satting 96% on RA and on physical exam he has no volume overload and he is wheezing in all 4 quadrants of his lungs.  I am going to give him solumedrol 80mg  IM x1 here and duoneb.  He will start on a medrol dose pak tonight and I gave him a combivent respimat.  We will obtain a CXR.  We discussed once again about cutting back smoking and quitting.

## 2022-07-18 NOTE — Assessment & Plan Note (Signed)
He had proteinuria in his urine on his last visit.  I am going to start him on low dose farxiga.  We will check his HgBA1c today.

## 2022-07-18 NOTE — Assessment & Plan Note (Signed)
His BP is currently controlled.  We will continue his current meds. 

## 2022-07-18 NOTE — Progress Notes (Signed)
Office Visit  Subjective   Patient ID: Rama Broering   DOB: 06-16-1965   Age: 57 y.o.   MRN: [id number]   Chief Complaint Chief Complaint  Patient presents with   Follow-up     History of Present Illness The patient is a 57 year old Caucasian/White male who returns with acute problem with SOB and wheezing.  This started about 3 weeks ago.  He states he can bend over or even exert himself and get SOB.  He is wheezing at night.  He normally does not use any inhaler or neb machine.  He is having some non-productive cough but no chest congestion, sinus congestion, post nasal drip, fevers, chills, or myaglias.  He has a history of mild COPD which was diagnosed in 2006. In 2006, he was having SOB and was sent to the pulmonologist who did spirometry and told him he probably had COPD but his heart was his problem where he has chronic systolic CHF with non-ischemic cardiomyopathy. They placed him on nebulization treatment at that time but he has not been on this for a while and denies any symptoms today. He was smoking 1.5- 2 ppd but had cut back down to 1 ppd of cigarettes.  This past year he has been smoking 1 ppd.  He started smoking at age 57. He has tried chantix in the past but this gave him nightmares. He also has tried nicotine patches in the past. He also has a history of OSA where he was snoring and not well rested in the morning. He was having frequent awakenings at night. He was sent for a sleep study in 2016 but this was nonconclusive and his cardiologist is thinking about repeating his sleep study.  He has been noncompliant with his CPAP for the past year.  See PMH for summary of pulmonary history and lab reports for status of any previous PFTs: COPD and Tobacco Abuse. Comorbid conditions : active smoker., CHF., and atrial fibrillation. He has no baseline symptoms of COPD.  He has the following modifiable risk factors: smoking, CHF, sedentary lifestyle, and obesity. Specifically denied  complaints: productive cough, change in color of sputum, pleuritic chest pain, purulent sputum, hemoptysis, and fever.   The patient is a 57 year old Caucasian/White male who returns for a follow-up visit for his T2 diabetes.  On his last visit, we noted on a spot FSBS that his sugar was 48.  He denied any problems with hypoglycemia episodes at that time but I did cut back his humulin 70/30 from 65 Units in AM to 60 Units in AM and his 55 Units in PM down to 50 Units in the PM.  He denies any hypoglycemia since his last visit.  He has a G-voke pen but has not had to use this.  This past year, we had him discontinue his victoza as his A1c has been well controlled.  He is watching his diet and has been walking for exercise.  Over the interim, he has not had any problems and he states he had one episode of hypoglycemia this morning.  He did not take his insulin this morning.  He states he is not having hypoglycemia as often and he states he had no symptoms of hypoglycemia and he wonders if this was incorrect on his glucometer.  He has not had to use his G-voke pen since his last visit.  He remains on metformin ER 2000mg  daily, humulin 70/30 with 60 Units in AM and 50 Units  in PM.  He specifically denies unexplained abdominal pain, nausea or vomiting.  He states his FSBS are running between 70-120 where he check his FSBS twice a day.   He states he tries to eats 2 meals per day but eats snacks and he is now eating a pack of crackers at night before bed.  His last HgBA1c was done 3 months ago and was 5.6%.  He came in fasting today in anticipation of lab work.  He denies any diabetic neuropathy, nephropathy or cardiovascular disease.  He was noted to have some microalbuminuria on his last visit.  He did have a dilated diabetic eye exam done on 11/27/2021 where he has mild diabetic retinopathy in both eyes that is improved per their notes.     The patient is a 57 year old Caucasian/White male who presents for a  follow-up evaluation of hypertension.    The patient has been checking his blood pressure at home. The patient's blood pressure has ranged systollically in the 130-140's. The patient's current medications include: carvedilol 25mg  BID, and lisinopril 20mg  BID. The patient has been tolerating his medications well. The patient denies any headache, visual changes, dizziness, lightheadness, chest pain, shortness of breath, weakness/numbness, and edema.  He reports there have been no other symptoms noted.  We stopped his HCTZ last year due to his dizziness with orthostatic hypotension.      Past Medical History Past Medical History:  Diagnosis Date   AICD (automatic cardioverter/defibrillator) present    Anxiety    Basal cell carcinoma    Carpal tunnel syndrome 10/2012   right-GSC   Cataract    Chronic systolic (congestive) heart failure (HCC)    COPD (chronic obstructive pulmonary disease) (HCC)    Depression    Diabetic retinopathy associated with type 2 diabetes mellitus (HCC)    Hyperlipidemia    Hypertension    ICD (implantable cardioverter-defibrillator) in place    Non-ischemic cardiomyopathy (HCC)    OSA (obstructive sleep apnea)    Tobacco abuse    Ventricular tachycardia (HCC)      Allergies Allergies  Allergen Reactions   Codeine Shortness Of Breath and Other (See Comments)    Severe HA's.   Niaspan [Niacin Er] Other (See Comments)    Per pt feels like skin is burning( REDNESS), and pins/needles are sticking    Sulfonamide Derivatives Other (See Comments)    thrush     Medications  Current Outpatient Medications:    ARIPiprazole (ABILIFY) 2 MG tablet, Take 2 mg by mouth daily., Disp: , Rfl:    aspirin 81 MG tablet, Take 81 mg by mouth daily. , Disp: , Rfl:    aspirin EC 81 MG tablet, Take 1 tablet (81 mg total) by mouth daily. Swallow whole., Disp: 90 tablet, Rfl: 3   BD INSULIN SYRINGE U/F 31G X 5/16" 1 ML MISC, as directed twice daily, Disp: 100 each, Rfl: 3   BD  PEN NEEDLE NANO 2ND GEN 32G X 4 MM MISC, See admin instructions., Disp: , Rfl:    blood glucose meter kit and supplies KIT, Test blood sugar 3 times daily. Dx code: E11.65, Disp: 1 each, Rfl: 0   carvedilol (COREG) 25 MG tablet, TAKE 1 TABLET(25 MG) BY MOUTH TWICE DAILY WITH FOOD, Disp: 180 tablet, Rfl: 1   clonazePAM (KLONOPIN) 0.5 MG tablet, Take 0.5 mg by mouth 2 (two) times daily as needed., Disp: , Rfl:    desvenlafaxine (PRISTIQ) 100 MG 24 hr tablet, TAKE 1 TABLET  BY MOUTH ONCE DAILY, Disp: 30 tablet, Rfl: 5   fenofibrate 160 MG tablet, TAKE 1 TABLET(160 MG) BY MOUTH EVERY DAY, Disp: 90 tablet, Rfl: 1   glucose blood test strip, Check FSBS 3-4 times per day, Disp: 100 each, Rfl: 12   hydrochlorothiazide (HYDRODIURIL) 25 MG tablet, Take 1 tablet (25 mg total) by mouth every other day., Disp: 45 tablet, Rfl: 3   insulin NPH-regular Human (HUMULIN 70/30) (70-30) 100 UNIT/ML injection, INJECT 60 UNITS UNDER THE SKIN IN THE MORNING AND INJECT 50 UNITS UNDER THE SKIN AT NIGHT, Disp: 40 mL, Rfl: 1   lisinopril (ZESTRIL) 20 MG tablet, Take 1 tablet (20 mg total) by mouth 2 (two) times daily., Disp: 180 tablet, Rfl: 3   metFORMIN (GLUCOPHAGE-XR) 500 MG 24 hr tablet, TAKE 2 TABLETS BY MOUTH TWICE DAILY, Disp: 360 tablet, Rfl: 1   omega-3 acid ethyl esters (LOVAZA) 1 g capsule, TAKE 2 CAPSULES BY MOUTH TWICE DAILY, Disp: 360 capsule, Rfl: 1   Review of Systems Review of Systems  Constitutional:  Negative for chills and fever.  Eyes:  Negative for blurred vision and double vision.  Respiratory:  Positive for cough, shortness of breath and wheezing. Negative for hemoptysis and sputum production.   Cardiovascular:  Negative for chest pain, palpitations and leg swelling.  Gastrointestinal:  Negative for abdominal pain, constipation, diarrhea, nausea and vomiting.  Genitourinary:  Negative for frequency.  Musculoskeletal:  Negative for myalgias.  Skin:  Negative for itching and rash.  Neurological:   Positive for headaches. Negative for dizziness and weakness.  Endo/Heme/Allergies:  Negative for polydipsia.       Objective:    Vitals BP 130/80   Pulse 69   Temp 98.4 F (36.9 C)   Resp 16   Ht 5\' 8"  (1.727 m)   Wt 185 lb 3.2 oz (84 kg)   SpO2 96%   BMI 28.16 kg/m    Physical Examination Physical Exam Constitutional:      Appearance: Normal appearance. He is not ill-appearing.  HENT:     Right Ear: Tympanic membrane, ear canal and external ear normal.     Left Ear: Tympanic membrane, ear canal and external ear normal.     Nose: Nose normal. No congestion or rhinorrhea.     Mouth/Throat:     Mouth: Mucous membranes are moist.     Pharynx: Oropharynx is clear. No oropharyngeal exudate or posterior oropharyngeal erythema.  Eyes:     General: No scleral icterus.    Conjunctiva/sclera: Conjunctivae normal.     Pupils: Pupils are equal, round, and reactive to light.  Cardiovascular:     Rate and Rhythm: Normal rate and regular rhythm.     Pulses: Normal pulses.     Heart sounds: No murmur heard.    No friction rub. No gallop.  Pulmonary:     Effort: Pulmonary effort is normal. No respiratory distress.     Breath sounds: Wheezing present. No rhonchi or rales.  Abdominal:     General: Abdomen is flat. Bowel sounds are normal. There is no distension.     Palpations: Abdomen is soft.     Tenderness: There is no abdominal tenderness.  Musculoskeletal:     Right lower leg: No edema.     Left lower leg: No edema.  Skin:    General: Skin is warm and dry.     Findings: No rash.  Neurological:     General: No focal deficit present.     Mental  Status: He is alert and oriented to person, place, and time.        Assessment & Plan:   COPD exacerbation (HCC) He is satting 96% on RA and on physical exam he has no volume overload and he is wheezing in all 4 quadrants of his lungs.  I am going to give him solumedrol 80mg  IM x1 here and duoneb.  He will start on a medrol dose  pak tonight and I gave him a combivent respimat.  We will obtain a CXR.  We discussed once again about cutting back smoking and quitting.  Microalbuminuria due to type 2 diabetes mellitus (HCC) He had proteinuria in his urine on his last visit.  I am going to start him on low dose farxiga.  We will check his HgBA1c today.  Essential hypertension His BP is currently controlled.  We will continue his current meds.    Return in about 3 months (around 10/18/2022).   Crist Fat, MD

## 2022-07-20 ENCOUNTER — Ambulatory Visit (INDEPENDENT_AMBULATORY_CARE_PROVIDER_SITE_OTHER): Payer: 59

## 2022-07-20 DIAGNOSIS — I428 Other cardiomyopathies: Secondary | ICD-10-CM

## 2022-08-06 NOTE — Progress Notes (Signed)
Remote ICD transmission.   

## 2022-08-08 ENCOUNTER — Other Ambulatory Visit: Payer: Self-pay | Admitting: Internal Medicine

## 2022-08-08 ENCOUNTER — Encounter: Payer: Self-pay | Admitting: Internal Medicine

## 2022-08-16 ENCOUNTER — Other Ambulatory Visit: Payer: Self-pay

## 2022-08-16 MED ORDER — EMPAGLIFLOZIN 25 MG PO TABS: 25.0000 mg | ORAL_TABLET | Freq: Every day | ORAL | 1 refills | Status: DC

## 2022-09-06 ENCOUNTER — Other Ambulatory Visit: Payer: Self-pay | Admitting: Internal Medicine

## 2022-09-23 ENCOUNTER — Other Ambulatory Visit: Payer: Self-pay | Admitting: Internal Medicine

## 2022-09-23 DIAGNOSIS — J441 Chronic obstructive pulmonary disease with (acute) exacerbation: Secondary | ICD-10-CM

## 2022-09-25 ENCOUNTER — Other Ambulatory Visit: Payer: Self-pay | Admitting: Internal Medicine

## 2022-09-27 ENCOUNTER — Other Ambulatory Visit: Payer: Self-pay

## 2022-09-27 ENCOUNTER — Encounter (HOSPITAL_COMMUNITY): Payer: Self-pay

## 2022-09-27 ENCOUNTER — Emergency Department (HOSPITAL_COMMUNITY): Payer: 59

## 2022-09-27 ENCOUNTER — Emergency Department (HOSPITAL_COMMUNITY)
Admission: EM | Admit: 2022-09-27 | Discharge: 2022-09-27 | Disposition: A | Discharge status: Home / Self Care | Payer: 59 | Attending: Emergency Medicine | Admitting: Emergency Medicine

## 2022-09-27 DIAGNOSIS — X58XXXA Exposure to other specified factors, initial encounter: Secondary | ICD-10-CM | POA: Diagnosis not present

## 2022-09-27 DIAGNOSIS — S2231XA Fracture of one rib, right side, initial encounter for closed fracture: Secondary | ICD-10-CM | POA: Insufficient documentation

## 2022-09-27 DIAGNOSIS — J449 Chronic obstructive pulmonary disease, unspecified: Secondary | ICD-10-CM | POA: Diagnosis not present

## 2022-09-27 DIAGNOSIS — Z95810 Presence of automatic (implantable) cardiac defibrillator: Secondary | ICD-10-CM | POA: Insufficient documentation

## 2022-09-27 DIAGNOSIS — E119 Type 2 diabetes mellitus without complications: Secondary | ICD-10-CM | POA: Diagnosis not present

## 2022-09-27 DIAGNOSIS — W19XXXA Unspecified fall, initial encounter: Secondary | ICD-10-CM

## 2022-09-27 DIAGNOSIS — I110 Hypertensive heart disease with heart failure: Secondary | ICD-10-CM | POA: Insufficient documentation

## 2022-09-27 DIAGNOSIS — R079 Chest pain, unspecified: Secondary | ICD-10-CM | POA: Diagnosis present

## 2022-09-27 DIAGNOSIS — I509 Heart failure, unspecified: Secondary | ICD-10-CM | POA: Insufficient documentation

## 2022-09-27 LAB — BASIC METABOLIC PANEL
Anion gap: 9 (ref 5–15)
BUN: 20 mg/dL (ref 6–20)
CO2: 22 mmol/L (ref 22–32)
Calcium: 8.7 mg/dL — ABNORMAL LOW (ref 8.9–10.3)
Chloride: 107 mmol/L (ref 98–111)
Creatinine, Ser: 0.91 mg/dL (ref 0.61–1.24)
GFR, Estimated: >60 mL/min (ref >60)
Glucose, Bld: 63 mg/dL — ABNORMAL LOW (ref 70–99)
Potassium: 3.9 mmol/L (ref 3.5–5.1)
Sodium: 138 mmol/L (ref 135–145)

## 2022-09-27 LAB — CBC WITH DIFFERENTIAL/PLATELET
Abs Immature Granulocytes: 0.12 10*3/uL — ABNORMAL HIGH (ref 0.00–0.07)
Basophils Absolute: 0.1 10*3/uL (ref 0.0–0.1)
Basophils Relative: 1 %
Eosinophils Absolute: 0.3 10*3/uL (ref 0.0–0.5)
Eosinophils Relative: 2 %
HCT: 53.5 % — ABNORMAL HIGH (ref 39.0–52.0)
Hemoglobin: 17.4 g/dL — ABNORMAL HIGH (ref 13.0–17.0)
Immature Granulocytes: 1 %
Lymphocytes Relative: 20 %
Lymphs Abs: 2.6 10*3/uL (ref 0.7–4.0)
MCH: 31.2 pg (ref 26.0–34.0)
MCHC: 32.5 g/dL (ref 30.0–36.0)
MCV: 96.1 fL (ref 80.0–100.0)
Monocytes Absolute: 1.1 10*3/uL — ABNORMAL HIGH (ref 0.1–1.0)
Monocytes Relative: 9 %
Neutro Abs: 8.9 10*3/uL — ABNORMAL HIGH (ref 1.7–7.7)
Neutrophils Relative %: 67 %
Platelets: 298 10*3/uL (ref 150–400)
RBC: 5.57 MIL/uL (ref 4.22–5.81)
RDW: 13.2 % (ref 11.5–15.5)
WBC: 13.1 10*3/uL — ABNORMAL HIGH (ref 4.0–10.5)
nRBC: 0 % (ref 0.0–0.2)

## 2022-09-27 LAB — TROPONIN I (HIGH SENSITIVITY): Troponin I (High Sensitivity): 8 ng/L (ref <18)

## 2022-09-27 LAB — D-DIMER, QUANTITATIVE: D-Dimer, Quant: 0.41 ug{FEU}/mL (ref 0.00–0.50)

## 2022-09-27 MED ORDER — METHOCARBAMOL 1000 MG/10ML IJ SOLN: 1000.0000 mg | Freq: Once | INTRAMUSCULAR | Status: DC

## 2022-09-27 MED ORDER — ACETAMINOPHEN 500 MG PO TABS: 1000.0000 mg | ORAL_TABLET | Freq: Four times a day (QID) | ORAL | 0 refills | Status: AC | PRN

## 2022-09-27 MED ORDER — METHOCARBAMOL 1000 MG/10ML IJ SOLN
1000.0000 mg | Freq: Once | INTRAVENOUS | Status: AC
  Administered 2022-09-27: 1000 mg via INTRAVENOUS
  Filled 2022-09-27: qty 1000

## 2022-09-27 MED ORDER — FENTANYL CITRATE PF 50 MCG/ML IJ SOSY
100.0000 ug | PREFILLED_SYRINGE | Freq: Once | INTRAMUSCULAR | Status: AC
  Administered 2022-09-27: 100 ug via INTRAVENOUS
  Filled 2022-09-27: qty 2

## 2022-09-27 MED ORDER — ACETAMINOPHEN 500 MG PO TABS
1000.0000 mg | ORAL_TABLET | Freq: Once | ORAL | Status: AC
  Administered 2022-09-27: 1000 mg via ORAL
  Filled 2022-09-27: qty 2

## 2022-09-27 MED ORDER — KETOROLAC TROMETHAMINE 15 MG/ML IJ SOLN
15.0000 mg | Freq: Once | INTRAMUSCULAR | Status: AC
  Administered 2022-09-27: 15 mg via INTRAVENOUS
  Filled 2022-09-27: qty 1

## 2022-09-27 MED ORDER — OXYCODONE HCL 5 MG PO TABS
5.0000 mg | ORAL_TABLET | Freq: Four times a day (QID) | ORAL | 0 refills | Status: DC | PRN
Start: 2022-09-27 — End: 2023-04-17

## 2022-09-27 MED ORDER — IBUPROFEN 600 MG PO TABS: 600.0000 mg | ORAL_TABLET | Freq: Four times a day (QID) | ORAL | 0 refills | Status: DC | PRN

## 2022-09-27 NOTE — ED Notes (Signed)
Pt being taken to CT.

## 2022-09-27 NOTE — ED Triage Notes (Signed)
At 1730 pt coughed and felt sharp pain near right rib cage. Pt states the pain is worse with palpation but there is also " an internal pain". Denies chest pain but is having baseline SOB. pT HAS copd. Rib pain is worse with deep breath.

## 2022-09-27 NOTE — ED Notes (Signed)
Patient transported to CT 

## 2022-09-27 NOTE — ED Provider Notes (Signed)
[facility] Provider Note   CSN: [id number] Arrival date & time: 09/27/22  1800     History Chief Complaint  Patient presents with   Rib Injury    HPI [facility] is a 57 y.o. male presenting for chest pain. The patient presents with a chief complaint of sudden onset of severe pain in the right side of the chest, which started while attempting to cough. The pain is described as feeling like being shot in the side and is persistent. The patient denies any recent falls or trauma. The patient has a history of multiple medical problems, including congestive heart failure, defibrillator implantation, diabetes, COPD, and high blood pressure. The patient denies any history of heart attacks.  The patient reports no recent fevers, chills, nausea, or vomiting. The patient has a history of COPD and is currently using an inhaler for treatment. The patient denies any pain on the left side, leg swelling, or abdominal pain. The patient experiences point tenderness in the back and difficulty catching their breath due to the pain.  Patient's recorded medical, surgical, social, medication list and allergies were reviewed in the Snapshot window as part of the initial history.   Review of Systems   Review of Systems  Constitutional:  Negative for chills and fever.  HENT:  Negative for ear pain and sore throat.   Eyes:  Negative for pain and visual disturbance.  Respiratory:  Negative for cough and shortness of breath.   Cardiovascular:  Positive for chest pain. Negative for palpitations.  Gastrointestinal:  Negative for abdominal pain and vomiting.  Genitourinary:  Negative for dysuria and hematuria.  Musculoskeletal:  Negative for arthralgias and back pain.  Skin:  Negative for color change and rash.  Neurological:  Negative for seizures and syncope.  All other systems reviewed and are negative.   Physical Exam Updated Vital Signs BP 138/83    Pulse 73   Temp 98.2 F (36.8 C) (Oral)   Resp 16   Ht 5\' 8"  (1.727 m)   Wt 80.3 kg   SpO2 94%   BMI 26.91 kg/m  Physical Exam Vitals and nursing note reviewed.  Constitutional:      General: He is not in acute distress.    Appearance: He is well-developed.  HENT:     Head: Normocephalic and atraumatic.  Eyes:     Conjunctiva/sclera: Conjunctivae normal.  Cardiovascular:     Rate and Rhythm: Normal rate and regular rhythm.     Heart sounds: No murmur heard. Pulmonary:     Effort: Pulmonary effort is normal. No respiratory distress.     Breath sounds: Normal breath sounds.  Chest:     Chest wall: Tenderness (Exquisite point tenderness on the right posterior chest.) present.  Abdominal:     Palpations: Abdomen is soft.     Tenderness: There is no abdominal tenderness.  Musculoskeletal:        General: No swelling.     Cervical back: Neck supple.  Skin:    General: Skin is warm and dry.     Capillary Refill: Capillary refill takes less than 2 seconds.  Neurological:     Mental Status: He is alert.  Psychiatric:        Mood and Affect: Mood normal.      ED Course/ Medical Decision Making/ A&P    Procedures Procedures   Medications Ordered in ED Medications  fentaNYL (SUBLIMAZE) injection 100 mcg (100 mcg Intravenous Given 09/27/22 1918)  ketorolac (TORADOL) 15 MG/ML injection 15 mg (15 mg Intravenous Given 09/27/22 2128)  acetaminophen (TYLENOL) tablet 1,000 mg (1,000 mg Oral Given 09/27/22 2128)  methocarbamol (ROBAXIN) 1,000 mg in dextrose 5 % 100 mL IVPB (0 mg Intravenous Stopped 09/27/22 2215)   Medical Decision Making: [facility] is a 57 y.o. male who presented to the ED today with chest pain, detailed above.   Patient placed on continuous vitals and telemetry monitoring while in ED which was reviewed periodically.  Complete initial physical exam performed, notably the patient was HDS in NAD.   Reviewed and confirmed nursing documentation for past medical  history, family history, social history.    Initial Assessment: With the patient's presentation of left-sided chest pain, most likely diagnosis is musculoskeletal chest pain versus GERD, although ACS remains on the differential. Other diagnoses were considered including (but not limited to) pulmonary embolism, community-acquired pneumonia, aortic dissection, pneumothorax, underlying bony abnormality, anemia. These are considered less likely due to history of present illness and physical exam findings.     In particular, concerning pulmonary embolism: Patient is PERC postivie for age and the they deny malignancy, recent surgery, history of DVT, or calf tenderness leading to a low risk Wells score. Aortic Dissection also reconsidered but seems less likely based on the location, quality, onset, and severity of symptoms in this case.  Patient also has a lack of underlying history of AD or TAA.  This is most consistent with an acute life/limb threatening illness complicated by underlying chronic conditions.   Initial Plan: Evaluate for ACS with delta troponin and EKG evaluated as below  Evaluate for dissection, bony abnormality, or pneumonia with chest x-ray and screening laboratory evaluation including CBC, BMP  Further evaluation for pulmonary embolism indicated at this time based on patient's PERC and Wells score.  Further evaluation for Thoracic Aortic Dissection not indicated at this time based on patient's clinical history and PE findings.   Initial Study Results: EKG was reviewed independently. Rate, rhythm, axis, intervals all examined and without medically relevant abnormality. ST segments without concerns for elevations.    Laboratory  Delta troponin demonstrated NAA   CBC and BMP without obvious metabolic or inflammatory abnormalities requiring further evaluation   Radiology  CT CHEST WO CONTRAST  Result Date: 09/27/2022 CLINICAL DATA:  Nontraumatic chest wall pain, cough, dyspnea  EXAM: CT CHEST WITHOUT CONTRAST TECHNIQUE: Multidetector CT imaging of the chest was performed following the standard protocol without IV contrast. RADIATION DOSE REDUCTION: This exam was performed according to the departmental dose-optimization program which includes automated exposure control, adjustment of the mA and/or kV according to patient size and/or use of iterative reconstruction technique. COMPARISON:  11/27/2005 FINDINGS: Cardiovascular: Extensive left anterior descending coronary artery calcification. Global cardiac size within normal limits. Left subclavian single lead pacemaker defibrillator in place with its tip penetrating the right ventricular apical wall. No pericardial effusion. Central pulmonary arteries are of normal caliber. Mild atherosclerotic calcification within the thoracic aorta. No aortic aneurysm. Mediastinum/Nodes: No enlarged mediastinal or axillary lymph nodes. Thyroid gland, trachea, and esophagus demonstrate no significant findings. Lungs/Pleura: Mean 13 mm ground-glass pulmonary nodule within the right apex noted at axial image # 25/4, indeterminate. Mean 8 mm subpleural somewhat inflammatory appearing pulmonary nodule within the right lower lobe, axial image # 77/4. Mild bronchiectasis and diffuse bronchial wall thickening present in keeping with airway inflammation. No confluent pulmonary infiltrate. No pneumothorax or pleural effusion. No central obstructing lesion. Upper Abdomen: No acute abnormality. Musculoskeletal: There is an acute  minimally displaced fracture of the right ninth rib posterolaterally, best seen at image # 115/4. IMPRESSION: 1. Acute, minimally displaced right ninth rib fracture. No pneumothorax. 2. Extensive left anterior descending coronary artery calcification. 3. Mild bronchiectasis and diffuse bronchial wall thickening in keeping with airway inflammation. 4. Mean 13 mm ground-glass pulmonary nodule within the right apex, indeterminate. Initial  follow-up with CT at 6 months is recommended to confirm persistence. If persistent, repeat CT is recommended every 2 years until 5 years of stability has been established. This recommendation follows the consensus statement: Guidelines for Management of Incidental Pulmonary Nodules Detected on CT Images: From the Fleischner Society 2017; Radiology 2017; 284:228-243. Aortic Atherosclerosis (ICD10-I70.0). Electronically Signed   By: Helyn Numbers M.D.   On: 09/27/2022 22:14   DG Chest 2 View  Result Date: 09/27/2022 CLINICAL DATA:  Right-sided chest pain. EXAM: CHEST - 2 VIEW COMPARISON:  July 18, 2022 FINDINGS: There is stable single lead ventricular pacer positioning. The heart size and mediastinal contours are within normal limits. Both lungs are clear. A radiopaque fusion plate and screws are seen overlying the lower cervical spine. The visualized skeletal structures are unremarkable. IMPRESSION: No active cardiopulmonary disease. Electronically Signed   By: Aram Candela M.D.   On: 09/27/2022 20:14    Final Assessment and Plan: Patient's history of present on his physical exam findings reveal no focal pathology.  Patient's ambulatory tolerating p.o. intake in no acute distress on serial reassessments. Rib fracture detected. Will treat with supportive care discussed incentive spirometry to the patient expressed understanding pain medication sent to his pharmacy for management. Clinical Impression:  1. Fall, initial encounter   2. Closed fracture of one rib of right side, initial encounter      Discharge   Final Clinical Impression(s) / ED Diagnoses Final diagnoses:  Fall, initial encounter  Closed fracture of one rib of right side, initial encounter    Rx / DC Orders ED Discharge Orders          Ordered    acetaminophen (TYLENOL) 500 MG tablet  Every 6 hours PRN        09/27/22 2248    ibuprofen (ADVIL) 600 MG tablet  Every 6 hours PRN        09/27/22 2248    oxyCODONE  (ROXICODONE) 5 MG immediate release tablet  Every 6 hours PRN        09/27/22 2248              Glyn Ade, MD 09/27/22 2251

## 2022-10-04 ENCOUNTER — Encounter: Payer: Self-pay | Admitting: Student

## 2022-10-04 ENCOUNTER — Other Ambulatory Visit: Payer: Self-pay | Admitting: Internal Medicine

## 2022-10-04 ENCOUNTER — Ambulatory Visit: Payer: 59 | Admitting: Student

## 2022-10-04 VITALS — BP 138/78 | HR 68 | Temp 98.4°F | Resp 16 | Ht 68.0 in | Wt 181.2 lb

## 2022-10-04 DIAGNOSIS — S2231XA Fracture of one rib, right side, initial encounter for closed fracture: Secondary | ICD-10-CM

## 2022-10-04 NOTE — Progress Notes (Signed)
Established Patient Office Visit  Subjective   Patient ID: [facility], male    DOB: 1965/05/16  Age: 57 y.o. MRN: [id number]  No chief complaint on file.   HPI  [facility] is a 57 year old male who presents to follow up after ER visit for a right rib injury from a fall. The patient initially presented to the emergency department with right-sided chest pain after a fall off the back of his truck 1 week prior.  He tells me that he coughed and felt a sharp stabbing pain to his right rib almost like he had been shot.  A workup was completed, and the CT without contrast scan exhibited an acute minimally displaced fracture of the right ninth rib posterolaterally.  The scan also revealed a 13 mm groundglass pulmonary nodule within the right apex.  The patient is recommended to follow-up in 6 months for a CT scan to confirm persistence.  Today the patient tells me that he feels well overall and has been able to go back to work on light duty.  He continues to use his incentive spirometer as directed and Tylenol for pain.  He was prescribed oxycodone, but he said he has not taken it because he is concerned about medications of that time.  He rates his pain today at a 3 out of 10 and says that it is difficult at night to rest.  Otherwise he denies shortness of breath, chest pain, hemoptysis, and no fevers.  There is residual bruising to the right posterior area.     Review of Systems  Respiratory: Negative.    Cardiovascular: Negative.   Gastrointestinal: Negative.   Genitourinary: Negative.   Musculoskeletal:  Positive for back pain and falls.      Objective:     BP 138/78   Pulse 68   Temp 98.4 F (36.9 C)   Resp 16   Ht 5\' 8"  (1.727 m)   Wt 181 lb 3.2 oz (82.2 kg)   SpO2 99%   BMI 27.55 kg/m    Physical Exam Vitals reviewed.  Constitutional:      Appearance: Normal appearance.  Cardiovascular:     Rate and Rhythm: Normal rate and regular rhythm.     Pulses: Normal  pulses.     Heart sounds: Normal heart sounds.  Pulmonary:     Effort: Pulmonary effort is normal.     Breath sounds: Examination of the right-middle field reveals decreased breath sounds. Examination of the right-lower field reveals decreased breath sounds. Decreased breath sounds present. No wheezing, rhonchi or rales.  Abdominal:     General: Bowel sounds are normal. There is no distension.     Palpations: Abdomen is soft.     Tenderness: There is no guarding.  Musculoskeletal:        General: Tenderness present. Normal range of motion.     Cervical back: Normal range of motion.  Skin:    General: Skin is warm and dry.     Capillary Refill: Capillary refill takes less than 2 seconds.  Neurological:     Mental Status: He is alert and oriented to person, place, and time.  Psychiatric:        Mood and Affect: Mood normal.        Behavior: Behavior normal.     The 10-year ASCVD risk score (Arnett DK, et al., 2019) is: 27.3%    Assessment & Plan:   Problem List Items Addressed This Visit  Closed fracture of one rib of right side - Primary    He is doing well overall. I asked him to continue using incentive spirometer daily and he can use ice along with other supportive methods. He can use the oxycodone at night for relief.        Return if symptoms worsen or fail to improve.    Edwena Blow, NP

## 2022-10-04 NOTE — Assessment & Plan Note (Addendum)
He is doing well overall. I asked him to continue using incentive spirometer daily and he can use ice along with other supportive methods. He can use the oxycodone at night for relief.   CT scan revealed a 13 mm groundglass pulmonary nodule within the right apex. The patient is recommended to follow-up in 6 months for a CT scan to confirm persistence.

## 2022-10-05 ENCOUNTER — Other Ambulatory Visit: Payer: Self-pay | Admitting: Internal Medicine

## 2022-10-19 ENCOUNTER — Ambulatory Visit: Payer: 59

## 2022-10-19 ENCOUNTER — Ambulatory Visit: Payer: 59 | Admitting: Internal Medicine

## 2022-10-19 ENCOUNTER — Encounter: Payer: Self-pay | Admitting: Internal Medicine

## 2022-10-19 VITALS — BP 128/82 | HR 73 | Temp 98.0°F | Resp 20 | Ht 68.0 in | Wt 179.0 lb

## 2022-10-19 DIAGNOSIS — I428 Other cardiomyopathies: Secondary | ICD-10-CM | POA: Diagnosis not present

## 2022-10-19 DIAGNOSIS — S2231XD Fracture of one rib, right side, subsequent encounter for fracture with routine healing: Secondary | ICD-10-CM

## 2022-10-19 DIAGNOSIS — I10 Essential (primary) hypertension: Secondary | ICD-10-CM

## 2022-10-19 DIAGNOSIS — E1129 Type 2 diabetes mellitus with other diabetic kidney complication: Secondary | ICD-10-CM

## 2022-10-19 NOTE — Progress Notes (Unsigned)
Office Visit  Subjective   Patient ID: Justin Ray   DOB: Mar 03, 1965   Age: 57 y.o.   MRN: [id number]   Chief Complaint Chief Complaint  Patient presents with   Follow-up    Diabetes     History of Present Illness The patient is a 57 year old Caucasian/White male who returns for a follow-up visit for his T2 diabetes.  This past year, we noted on a spot FSBS that his sugar was 48.  He denied any problems with hypoglycemia episodes at that time but I did cut back his humulin 70/30 from 65 Units in AM to 60 Units in AM and his 55 Units in PM down to 50 Units in the PM.  He denies any hypoglycemia since his last visit.  He has a G-voke pen but has not had to use this.  This past year, we had him discontinue his victoza as his A1c has been well controlled.  He is watching his diet and has been walking for exercise.  Over the interim, he has not had any problems and he states he had one episode of hypoglycemia this morning.  He did not take his insulin this morning.  He states he is not having hypoglycemia as often and he states he had no symptoms of hypoglycemia and he wonders if this was incorrect on his glucometer.  He has not had to use his G-voke pen since his last visit.  He remains on metformin ER 2000mg  daily, jardiance 25mg  daily and humulin 70/30 with 60 Units in AM and 50 Units in PM.  He specifically denies unexplained abdominal pain, nausea or vomiting.  He states his FSBS are running between 70-120 where he check his FSBS twice a day.   He states he tries to eats 2 meals per day but eats snacks and he is now eating a pack of crackers at night before bed.  His last HgBA1c was done 3 months ago and was 6%.  He came in fasting today in anticipation of lab work.  He denies any diabetic neuropathy, nephropathy or cardiovascular disease.  He was noted to have some microalbuminuria on his last visit.  He did have a dilated diabetic eye exam done on 11/27/2021 where he has mild diabetic retinopathy  in both eyes that is improved per their notes.     The patient is a 57 year old Caucasian/White male who presents for a follow-up evaluation of hypertension.    The patient has been checking his blood pressure at home. The patient's blood pressure has ranged systollically in the 110-140's. The patient's current medications include: carvedilol 25mg  BID, and lisinopril 20mg  BID. The patient has been tolerating his medications well. The patient denies any headache, visual changes, dizziness, lightheadness, chest pain, shortness of breath, weakness/numbness, and edema.  He reports there have been no other symptoms noted.  We stopped his HCTZ last year due to his dizziness with orthostatic hypotension.  Rawlins Bookwalter is a 57 year old male who also comes in for followup of right sided rib fractures.  He was seen in the ER last month after a right rib injury from a fall.  He states he fell but had minor pain.  However, he coughed about 5 days later and felt a sharp stabbing pain to his right rib almost like he had been shot.  A workup was completed, and the CT without contrast scan exhibited an acute minimally displaced fracture of the right ninth rib posterolaterally.  The scan also revealed a 13 mm groundglass pulmonary nodule within the right apex.  The patient is recommended to follow-up in 6 months for a CT scan to confirm persistence.       Past Medical History Past Medical History:  Diagnosis Date   AICD (automatic cardioverter/defibrillator) present    Anxiety    Basal cell carcinoma    Carpal tunnel syndrome 10/2012   right-GSC   Cataract    Chronic systolic (congestive) heart failure (HCC)    COPD (chronic obstructive pulmonary disease) (HCC)    Depression    Diabetic retinopathy associated with type 2 diabetes mellitus (HCC)    Hyperlipidemia    Hypertension    ICD (implantable cardioverter-defibrillator) in place    Non-ischemic cardiomyopathy (HCC)    OSA (obstructive sleep apnea)     Tobacco abuse    Ventricular tachycardia (HCC)      Allergies Allergies  Allergen Reactions   Codeine Shortness Of Breath and Other (See Comments)    Severe HA's.   Niaspan [Niacin Er] Other (See Comments)    Per pt feels like skin is burning( REDNESS), and pins/needles are sticking    Sulfonamide Derivatives Other (See Comments)    thrush     Medications  Current Outpatient Medications:    acetaminophen (TYLENOL) 500 MG tablet, Take 2 tablets (1,000 mg total) by mouth every 6 (six) hours as needed for up to 30 doses., Disp: 30 tablet, Rfl: 0   ARIPiprazole (ABILIFY) 2 MG tablet, TAKE 1 TABLET BY MOUTH DAILY, Disp: 30 tablet, Rfl: 1   aspirin 81 MG tablet, Take 81 mg by mouth daily. , Disp: , Rfl:    aspirin EC 81 MG tablet, Take 1 tablet (81 mg total) by mouth daily. Swallow whole., Disp: 90 tablet, Rfl: 3   BD PEN NEEDLE NANO 2ND GEN 32G X 4 MM MISC, See admin instructions., Disp: , Rfl:    blood glucose meter kit and supplies KIT, Test blood sugar 3 times daily. Dx code: E11.65, Disp: 1 each, Rfl: 0   carvedilol (COREG) 25 MG tablet, TAKE 1 TABLET(25 MG) BY MOUTH TWICE DAILY WITH FOOD, Disp: 180 tablet, Rfl: 1   clonazePAM (KLONOPIN) 0.5 MG tablet, Take 0.5 mg by mouth 2 (two) times daily as needed., Disp: , Rfl:    COMBIVENT RESPIMAT 20-100 MCG/ACT AERS respimat, INHALE 1 PUFF INTO THE LUNGS EVERY 6 HOURS, Disp: 4 g, Rfl: 1   desvenlafaxine (PRISTIQ) 100 MG 24 hr tablet, TAKE 1 TABLET BY MOUTH EVERY DAY, Disp: 30 tablet, Rfl: 5   empagliflozin (JARDIANCE) 25 MG TABS tablet, Take 1 tablet (25 mg total) by mouth daily before breakfast., Disp: 90 tablet, Rfl: 1   fenofibrate 160 MG tablet, TAKE 1 TABLET(160 MG) BY MOUTH EVERY DAY, Disp: 90 tablet, Rfl: 1   glucose blood test strip, Check FSBS 3-4 times per day, Disp: 100 each, Rfl: 12   hydrochlorothiazide (HYDRODIURIL) 25 MG tablet, Take 1 tablet (25 mg total) by mouth every other day., Disp: 45 tablet, Rfl: 3   ibuprofen (ADVIL)  600 MG tablet, Take 1 tablet (600 mg total) by mouth every 6 (six) hours as needed., Disp: 30 tablet, Rfl: 0   insulin NPH-regular Human (HUMULIN 70/30) (70-30) 100 UNIT/ML injection, INJECT 60 UNITS UNDER THE SKIN IN THE AM AND 50 UNITS AT NIGHT, Disp: 40 mL, Rfl: 1   lisinopril (ZESTRIL) 20 MG tablet, Take 1 tablet (20 mg total) by mouth 2 (two) times daily., Disp: 180  tablet, Rfl: 3   metFORMIN (GLUCOPHAGE-XR) 500 MG 24 hr tablet, TAKE 2 TABLETS BY MOUTH TWICE DAILY, Disp: 360 tablet, Rfl: 1   methylPREDNISolone (MEDROL DOSEPAK) 4 MG TBPK tablet, Use as directed., Disp: 21 tablet, Rfl: 0   omega-3 acid ethyl esters (LOVAZA) 1 g capsule, TAKE 2 CAPSULES BY MOUTH TWICE DAILY, Disp: 360 capsule, Rfl: 1   oxyCODONE (ROXICODONE) 5 MG immediate release tablet, Take 1-2 tablets (5-10 mg total) by mouth every 6 (six) hours as needed for severe pain., Disp: 18 tablet, Rfl: 0   SURE COMFORT INSULIN SYRINGE 31G X 5/16" 1 ML MISC, AS DIRECTED TWICE DAILY, Disp: 100 each, Rfl: 3   Review of Systems Review of Systems  Constitutional:  Negative for chills and fever.  Eyes:  Negative for blurred vision and double vision.  Respiratory:  Negative for cough and shortness of breath.   Cardiovascular:  Negative for chest pain, palpitations and leg swelling.  Gastrointestinal:  Negative for abdominal pain, constipation, diarrhea, nausea and vomiting.  Genitourinary:  Negative for frequency.  Musculoskeletal:  Negative for myalgias.  Skin:  Negative for itching and rash.  Neurological:  Negative for dizziness, weakness and headaches.  Endo/Heme/Allergies:  Negative for polydipsia.       Objective:    Vitals BP 128/82   Pulse 73   Temp 98 F (36.7 C)   Resp 20   Ht 5\' 8"  (1.727 m)   Wt 179 lb (81.2 kg)   SpO2 95%   BMI 27.22 kg/m    Physical Examination Physical Exam Constitutional:      Appearance: Normal appearance. He is not ill-appearing.  Cardiovascular:     Rate and Rhythm: Normal rate  and regular rhythm.     Pulses: Normal pulses.     Heart sounds: No murmur heard.    No friction rub. No gallop.  Pulmonary:     Effort: Pulmonary effort is normal. No respiratory distress.     Breath sounds: No wheezing, rhonchi or rales.  Abdominal:     General: Abdomen is flat. Bowel sounds are normal. There is no distension.     Palpations: Abdomen is soft.     Tenderness: There is no abdominal tenderness.  Musculoskeletal:     Right lower leg: No edema.     Left lower leg: No edema.  Skin:    General: Skin is warm and dry.     Findings: No rash.  Neurological:     General: No focal deficit present.     Mental Status: He is alert and oriented to person, place, and time.  Psychiatric:        Mood and Affect: Mood normal.        Behavior: Behavior normal.        Assessment & Plan:   Essential hypertension His BP is doing well.  We will continue his meds.  Microalbuminuria due to type 2 diabetes mellitus (HCC) His diabetes has been controlled.  We will check his HgBA1c on his next visit. He states he has a yearly diabetic eye exam coming up in 11/2022.  Closed fracture of one rib of right side He broke his rib after coughing.  I am going to obtain a bone density on him to evaluate for osteoporosis.    Return in about 3 months (around 01/19/2023).   Crist Fat, MD

## 2022-10-19 NOTE — Assessment & Plan Note (Signed)
His diabetes has been controlled.  We will check his HgBA1c on his next visit. He states he has a yearly diabetic eye exam coming up in 11/2022.

## 2022-10-19 NOTE — Assessment & Plan Note (Signed)
His BP is doing well.  We will continue his meds.

## 2022-10-19 NOTE — Assessment & Plan Note (Signed)
He broke his rib after coughing.  I am going to obtain a bone density on him to evaluate for osteoporosis.

## 2022-10-23 LAB — CUP PACEART REMOTE DEVICE CHECK
Battery Remaining Longevity: 30 mo
Battery Remaining Percentage: 35 %
Brady Statistic RV Percent Paced: 0 %
Date Time Interrogation Session: 20241008125600
HighPow Impedance: 65 Ohm
Implantable Lead Connection Status: 753985
Implantable Lead Implant Date: 20060117
Implantable Lead Location: 753860
Implantable Lead Model: 185
Implantable Lead Serial Number: 124785
Implantable Pulse Generator Implant Date: 20141107
Lead Channel Impedance Value: 473 Ohm
Lead Channel Pacing Threshold Amplitude: 1.7 V
Lead Channel Pacing Threshold Pulse Width: 0.8 ms
Lead Channel Setting Pacing Amplitude: 2.8 V
Lead Channel Setting Pacing Pulse Width: 0.8 ms
Lead Channel Setting Sensing Sensitivity: 0.6 mV
Pulse Gen Serial Number: 126350

## 2022-10-26 NOTE — Progress Notes (Signed)
Remote ICD transmission.   

## 2022-11-02 ENCOUNTER — Other Ambulatory Visit: Payer: Self-pay | Admitting: Internal Medicine

## 2022-12-12 ENCOUNTER — Encounter: Payer: Self-pay | Admitting: Internal Medicine

## 2022-12-12 ENCOUNTER — Ambulatory Visit: Payer: 59 | Attending: Internal Medicine | Admitting: Internal Medicine

## 2022-12-12 VITALS — BP 136/80 | HR 69 | Ht 68.0 in | Wt 181.2 lb

## 2022-12-12 DIAGNOSIS — I5022 Chronic systolic (congestive) heart failure: Secondary | ICD-10-CM

## 2022-12-12 LAB — CUP PACEART INCLINIC DEVICE CHECK
Date Time Interrogation Session: 20241127151215
HighPow Impedance: 50 Ohm
HighPow Impedance: 68 Ohm
Implantable Lead Connection Status: 753985
Implantable Lead Implant Date: 20060117
Implantable Lead Location: 753860
Implantable Lead Model: 185
Implantable Lead Serial Number: 124785
Implantable Pulse Generator Implant Date: 20141107
Lead Channel Impedance Value: 493 Ohm
Lead Channel Pacing Threshold Amplitude: 1.8 V
Lead Channel Pacing Threshold Pulse Width: 0.8 ms
Lead Channel Sensing Intrinsic Amplitude: 13.6 mV
Lead Channel Setting Pacing Amplitude: 2.8 V
Lead Channel Setting Pacing Pulse Width: 0.8 ms
Lead Channel Setting Sensing Sensitivity: 0.6 mV
Pulse Gen Serial Number: 126350

## 2022-12-12 NOTE — Progress Notes (Signed)
HPI [name] returns today for followup. He is a pleasant 57 yo man with a h/o a non-ischemic CM, s/p ICD insertion, and HTN. He has done well in the interim. He has occaisional palpitations but no ICD therapies. He denies chest pain or sob. No edema. He is still working but is set to retire at the end of the year.  Allergies  Allergen Reactions   Codeine Shortness Of Breath and Other (See Comments)    Severe HA's.   Niaspan [Niacin Er (Antihyperlipidemic)] Other (See Comments)    Per pt feels like skin is burning( REDNESS), and pins/needles are sticking    Sulfonamide Derivatives Other (See Comments)    thrush     Current Outpatient Medications  Medication Sig Dispense Refill   acetaminophen (TYLENOL) 500 MG tablet Take 2 tablets (1,000 mg total) by mouth every 6 (six) hours as needed for up to 30 doses. 30 tablet 0   ARIPiprazole (ABILIFY) 2 MG tablet TAKE 1 TABLET BY MOUTH DAILY 30 tablet 1   aspirin 81 MG tablet Take 81 mg by mouth daily.      aspirin EC 81 MG tablet Take 1 tablet (81 mg total) by mouth daily. Swallow whole. 90 tablet 3   BD PEN NEEDLE NANO 2ND GEN 32G X 4 MM MISC See admin instructions.     blood glucose meter kit and supplies KIT Test blood sugar 3 times daily. Dx code: E11.65 1 each 0   carvedilol (COREG) 25 MG tablet TAKE 1 TABLET(25 MG) BY MOUTH TWICE DAILY WITH FOOD 180 tablet 1   COMBIVENT RESPIMAT 20-100 MCG/ACT AERS respimat INHALE 1 PUFF INTO THE LUNGS EVERY 6 HOURS 4 g 1   desvenlafaxine (PRISTIQ) 100 MG 24 hr tablet TAKE 1 TABLET BY MOUTH EVERY DAY 30 tablet 5   empagliflozin (JARDIANCE) 25 MG TABS tablet Take 1 tablet (25 mg total) by mouth daily before breakfast. 90 tablet 1   fenofibrate 160 MG tablet TAKE 1 TABLET(160 MG) BY MOUTH EVERY DAY 90 tablet 1   glucose blood test strip Check FSBS 3-4 times per day 100 each 12   hydrochlorothiazide (HYDRODIURIL) 25 MG tablet Take 1 tablet (25 mg total) by mouth every other day. 45 tablet 3    insulin NPH-regular Human (HUMULIN 70/30) (70-30) 100 UNIT/ML injection INJECT 60 UNITS UNDER THE SKIN IN THE AM AND 50 UNITS AT NIGHT 40 mL 1   lisinopril (ZESTRIL) 20 MG tablet Take 1 tablet (20 mg total) by mouth 2 (two) times daily. 180 tablet 3   metFORMIN (GLUCOPHAGE-XR) 500 MG 24 hr tablet TAKE 2 TABLETS BY MOUTH TWICE DAILY 360 tablet 1   methylPREDNISolone (MEDROL DOSEPAK) 4 MG TBPK tablet Use as directed. 21 tablet 0   omega-3 acid ethyl esters (LOVAZA) 1 g capsule TAKE 2 CAPSULES BY MOUTH TWICE DAILY 360 capsule 1   SURE COMFORT INSULIN SYRINGE 31G X 5/16" 1 ML MISC AS DIRECTED TWICE DAILY 100 each 3   clonazePAM (KLONOPIN) 0.5 MG tablet Take 0.5 mg by mouth 2 (two) times daily as needed. (Patient not taking: Reported on 12/12/2022)     ibuprofen (ADVIL) 600 MG tablet Take 1 tablet (600 mg total) by mouth every 6 (six) hours as needed. (Patient not taking: Reported on 12/12/2022) 30 tablet 0   oxyCODONE (ROXICODONE) 5 MG immediate release tablet Take 1-2 tablets (5-10 mg total) by mouth every 6 (six) hours as needed for severe pain. (Patient not taking: Reported on  12/12/2022) 18 tablet 0   No current facility-administered medications for this visit.     Past Medical History:  Diagnosis Date   AICD (automatic cardioverter/defibrillator) present    Anxiety    Basal cell carcinoma    Carpal tunnel syndrome 10/2012   right-GSC   Cataract    Chronic systolic (congestive) heart failure (HCC)    COPD (chronic obstructive pulmonary disease) (HCC)    Depression    Diabetic retinopathy associated with type 2 diabetes mellitus (HCC)    Hyperlipidemia    Hypertension    ICD (implantable cardioverter-defibrillator) in place    Non-ischemic cardiomyopathy (HCC)    OSA (obstructive sleep apnea)    Tobacco abuse    Ventricular tachycardia (HCC)     ROS:   All systems reviewed and negative except as noted in the HPI.   Past Surgical History:  Procedure Laterality Date   ANTERIOR  CERVICAL DECOMP/DISCECTOMY FUSION N/A 03/06/2017   Procedure: ANTERIOR CERVICAL DECOMPRESSION FUSION,CERVICAL 6-7 WITH INSTRUMENTATION AND ALLOGRAFT REQUESTED TIME 2.5 HRS;  Surgeon: Estill Bamberg, MD;  Location: [facility] OR;  Service: Orthopedics;  Laterality: N/A;  ANTERIOR CERVICAL DECOMPRESSION FUSION, CERVICAL 6-7 WITH INSTRUMENTATION AND ALLOGRAFT REQUESTED TIME 2.5 HRS   CARDIAC DEFIBRILLATOR PLACEMENT  02/01/2004   CARDIAC DEFIBRILLATOR PLACEMENT  11/14   replaced with new one   CARPAL TUNNEL RELEASE Left 12/19/2012   Procedure: LEFT CARPAL TUNNEL RELEASE;  Surgeon: Dominica Severin, MD;  Location: [facility];  Service: Orthopedics;  Laterality: Left;   CARPAL TUNNEL RELEASE Right 02/1999   EYE SURGERY Bilateral    cataract surgery with lens implants   IMPLANTABLE CARDIOVERTER DEFIBRILLATOR (ICD) GENERATOR CHANGE N/A 11/21/2012   Procedure: ICD GENERATOR CHANGE;  Surgeon: Marinus Maw, MD;  Location: [facility] CATH LAB;  Service: Cardiovascular;  Laterality: N/A;   SHOULDER ARTHROSCOPY Left 07/03/2018   Procedure: ARTHROSCOPY SHOULDER;  Surgeon: Jones Broom, MD;  Location: [facility];  Service: Orthopedics;  Laterality: Left;   SHOULDER ARTHROSCOPY WITH SUBACROMIAL DECOMPRESSION AND BICEP TENDON REPAIR Left 07/03/2018   Procedure: SHOULDER ARTHROSCOPY WITH SUBACROMIAL DECOMPRESSION  BICEP TENODESIS;  Surgeon: Jones Broom, MD;  Location: [facility];  Service: Orthopedics;  Laterality: Left;   TONSILLECTOMY       Family History  Problem Relation Age of Onset   Coronary artery disease Father        CABG   Cancer Mother        colon   Heart disease Brother    Cancer Other      Social History   Socioeconomic History   Marital status: Widowed    Spouse name: Not on file   Number of children: 0   Years of education: Not on file   Highest education level: Not on file  Occupational History   Occupation: maintenance    Employer: UNEMPLOYED   Tobacco Use   Smoking status: Every Day    Current packs/day: 1.50    Types: Cigarettes   Smokeless tobacco: Never  Vaping Use   Vaping status: Never Used  Substance and Sexual Activity   Alcohol use: Yes    Comment: A SIX PACK EVERY SIX MONTHS    Drug use: No   Sexual activity: Never    Comment: number of sex partners in the last 12 months  0  Other Topics Concern   Not on file  Social History Narrative   Not on file   Social Determinants of Health   Financial  Resource Strain: Not on file  Food Insecurity: Not on file  Transportation Needs: Not on file  Physical Activity: Not on file  Stress: Not on file  Social Connections: Not on file  Intimate Partner Violence: Not on file     BP 136/80   Pulse 69   Ht 5\' 8"  (1.727 m)   Wt 181 lb 3.2 oz (82.2 kg)   SpO2 96%   BMI 27.55 kg/m   Physical Exam:  Well appearing NAD HEENT: Unremarkable Neck:  No JVD, no thyromegally Lymphatics:  No adenopathy Back:  No CVA tenderness Lungs:  Clear with no wheezes HEART:  Regular rate rhythm, no murmurs, no rubs, no clicks Abd:  soft, positive bowel sounds, no organomegally, no rebound, no guarding Ext:  2 plus pulses, no edema, no cyanosis, no clubbing Skin:  No rashes no nodules Neuro:  CN II through XII intact, motor grossly intact  DEVICE  Normal device function.  See PaceArt for details.   Assess/Plan:  1.Chronic systolic heart failure - his symptoms remain class 2A. He will continue his current guideline directed medical therapy. 2. ICD - his  [employer] ICD is working normally with 2.5 years of longevity remaining.  3. HTN - his bp is normal. He will maintain a low sodium diet. 4. Tobacco abuse - he is down to a half  PPD. I encouraged him to stop smoking.    Justin Gowda Aleatha Taite,MD

## 2022-12-12 NOTE — Patient Instructions (Signed)

## 2022-12-16 ENCOUNTER — Other Ambulatory Visit: Payer: Self-pay | Admitting: Internal Medicine

## 2022-12-24 ENCOUNTER — Other Ambulatory Visit: Payer: Self-pay | Admitting: Physician Assistant

## 2022-12-26 ENCOUNTER — Other Ambulatory Visit: Payer: Self-pay

## 2022-12-26 MED ORDER — ALENDRONATE SODIUM 70 MG PO TABS: 70.0000 mg | ORAL_TABLET | ORAL | 11 refills | Status: DC

## 2022-12-26 NOTE — Progress Notes (Signed)
New Rx

## 2022-12-28 ENCOUNTER — Other Ambulatory Visit: Payer: Self-pay

## 2022-12-28 ENCOUNTER — Other Ambulatory Visit: Payer: Self-pay | Admitting: Physician Assistant

## 2022-12-28 MED ORDER — ARIPIPRAZOLE 2 MG PO TABS: 2.0000 mg | ORAL_TABLET | Freq: Every day | ORAL | 1 refills | Status: DC

## 2022-12-28 NOTE — Progress Notes (Signed)
Rx Refill

## 2023-01-18 ENCOUNTER — Encounter: Payer: Self-pay | Admitting: Internal Medicine

## 2023-01-18 ENCOUNTER — Ambulatory Visit (INDEPENDENT_AMBULATORY_CARE_PROVIDER_SITE_OTHER): Payer: 59

## 2023-01-18 ENCOUNTER — Ambulatory Visit: Payer: 59 | Admitting: Internal Medicine

## 2023-01-18 VITALS — BP 128/82 | HR 68 | Temp 98.6°F | Resp 17 | Ht 68.0 in | Wt 179.8 lb

## 2023-01-18 DIAGNOSIS — I428 Other cardiomyopathies: Secondary | ICD-10-CM | POA: Diagnosis not present

## 2023-01-18 DIAGNOSIS — I10 Essential (primary) hypertension: Secondary | ICD-10-CM | POA: Diagnosis not present

## 2023-01-18 DIAGNOSIS — M8000XD Age-related osteoporosis with current pathological fracture, unspecified site, subsequent encounter for fracture with routine healing: Secondary | ICD-10-CM | POA: Diagnosis not present

## 2023-01-18 DIAGNOSIS — R809 Proteinuria, unspecified: Secondary | ICD-10-CM

## 2023-01-18 DIAGNOSIS — E1129 Type 2 diabetes mellitus with other diabetic kidney complication: Secondary | ICD-10-CM

## 2023-01-18 LAB — CUP PACEART REMOTE DEVICE CHECK
Battery Remaining Longevity: 30 mo
Battery Remaining Percentage: 32 %
Brady Statistic RV Percent Paced: 0 %
Date Time Interrogation Session: 20250103025900
HighPow Impedance: 66 Ohm
Implantable Lead Connection Status: 753985
Implantable Lead Implant Date: 20060117
Implantable Lead Location: 753860
Implantable Lead Model: 185
Implantable Lead Serial Number: 124785
Implantable Pulse Generator Implant Date: 20141107
Lead Channel Impedance Value: 483 Ohm
Lead Channel Pacing Threshold Amplitude: 1.8 V
Lead Channel Pacing Threshold Pulse Width: 0.8 ms
Lead Channel Setting Pacing Amplitude: 2.8 V
Lead Channel Setting Pacing Pulse Width: 0.8 ms
Lead Channel Setting Sensing Sensitivity: 0.6 mV
Pulse Gen Serial Number: 126350

## 2023-01-18 NOTE — Assessment & Plan Note (Signed)
 We will check his HgBA1c today.  We will continue his current medications.

## 2023-01-18 NOTE — Assessment & Plan Note (Signed)
His BP is currently controlled.  We will continue to monitor.

## 2023-01-18 NOTE — Assessment & Plan Note (Signed)
 He has osteoporosis.   His calcium level was slightly low on his last visit.  I am going to repeat this testing and check his Vit D level.  He will remains on alendronate at this time.

## 2023-01-18 NOTE — Progress Notes (Signed)
 Office Visit  Subjective   Patient ID: Justin Ray   DOB: 08/16/1965   Age: 58 y.o.   MRN: [id number]   Chief Complaint Chief Complaint  Patient presents with   Follow-up     History of Present Illness The patient is a 58 year old Caucasian/White male who returns for a follow-up visit for his T2 diabetes.  This past year, we noted on a spot FSBS that his sugar was 48.  He denied any problems with hypoglycemia episodes at that time but I did cut back his humulin  70/30 from 65 Units in AM to 60 Units in AM and his 55 Units in PM down to 50 Units in the PM.  He denies any hypoglycemia since his last visit.  He has a G-voke pen but has not had to use this.  This past year, we had him discontinue his victoza  as his A1c has been well controlled.  He is watching his diet and has been walking for exercise.  He denies any hypoglycemia events.  He has not had to use his G-voke pen since his last visit.  He remains on metformin  ER 2000mg  daily, jardiance  25mg  daily and humulin  70/30 with 60 Units in AM and 50 Units in PM.  He specifically denies unexplained abdominal pain, nausea or vomiting.  He states his FSBS are running between 70-120 where he check his FSBS once a day.   He states he tries to eats 2 meals per day but eats snacks and he is now eating a pack of crackers at night before bed.  His last HgBA1c was done 6 months ago and was 6%.  He came in fasting today in anticipation of lab work.  He denies any diabetic neuropathy, nephropathy or cardiovascular disease.  He was noted to have some microalbuminuria in the past..  He did have a dilated diabetic eye exam done on 12/08/2022 with Dr. Sharalyn at [facility]  where he has mild diabetic retinopathy in both eyes that is improved per their notes.  The patient is a 58 year old Caucasian/White male who presents for a follow-up evaluation of hypertension.    The patient has been checking his blood pressure at home. The patient's blood pressure has  ranged systollically in the 110-140's. The patient's current medications include: carvedilol  25mg  BID, hydrochlorothiazide  25mg  po every other day and lisinopril  20mg  BID. The patient has been tolerating his medications well. The patient denies any headache, visual changes, dizziness, lightheadness, chest pain, shortness of breath, weakness/numbness, and edema.  He reports there have been no other symptoms noted.  We stopped his HCTZ last year due to his dizziness with orthostatic hypotension.  He is now back on hydrochlorothiazide .  Justin Ray is a 58 year old male who returns for followup of his osteoporosis.  He was seen in the ER in 09/2022 after a right rib injury from a fall.  A workup was completed, and the CT without contrast scan exhibited an acute minimally displaced fracture of the right ninth rib posterolaterally.  The scan also revealed a 13 mm groundglass pulmonary nodule within the right apex.  The patient is recommended to follow-up in 6 months for a CT scan to confirm persistence.  He does not know if there is a family history of osteoporosis.  He has broken his ribs once in the past before this episode in 09/2022.   The following risk factors are noted: smoking and diabetes.  He denies the following: high caffeine intake,  alcohol consumption of more that 7 ounces per week, daily prednisone  use, hyperthyroidism, surgical resection of his bowel, and surgical resection of his stomach. He states she exercises routinely by walking.  We did a bone density on 11/30/2022 and this showed a t-score of the lumbar spine at -2.7 with osteoporosis.  We did start him on alendronate  in 12/2022.     Past Medical History Past Medical History:  Diagnosis Date   AICD (automatic cardioverter/defibrillator) present    Anxiety    Basal cell carcinoma    Carpal tunnel syndrome 10/2012   right-GSC   Cataract    Chronic systolic (congestive) heart failure (HCC)    COPD (chronic obstructive pulmonary  disease) (HCC)    Depression    Diabetic retinopathy associated with type 2 diabetes mellitus (HCC)    Hyperlipidemia    Hypertension    ICD (implantable cardioverter-defibrillator) in place    Non-ischemic cardiomyopathy (HCC)    OSA (obstructive sleep apnea)    Tobacco abuse    Ventricular tachycardia (HCC)      Allergies Allergies  Allergen Reactions   Codeine Shortness Of Breath and Other (See Comments)    Severe HA's.   Niaspan [Niacin Er (Antihyperlipidemic)] Other (See Comments)    Per pt feels like skin is burning( REDNESS), and pins/needles are sticking    Sulfonamide Derivatives Other (See Comments)    thrush     Medications  Current Outpatient Medications:    acetaminophen  (TYLENOL ) 500 MG tablet, Take 2 tablets (1,000 mg total) by mouth every 6 (six) hours as needed for up to 30 doses., Disp: 30 tablet, Rfl: 0   alendronate  (FOSAMAX ) 70 MG tablet, Take 1 tablet (70 mg total) by mouth every 7 (seven) days. Take with a full glass of water on an empty stomach., Disp: 4 tablet, Rfl: 11   ARIPiprazole  (ABILIFY ) 2 MG tablet, Take 1 tablet (2 mg total) by mouth daily., Disp: 30 tablet, Rfl: 1   aspirin  81 MG tablet, Take 81 mg by mouth daily. , Disp: , Rfl:    ASPIRIN  LOW DOSE 81 MG tablet, TAKE 1 TABLET(81 MG) BY MOUTH DAILY. SWALLOW WHOLE, Disp: 90 tablet, Rfl: 3   BD PEN NEEDLE NANO 2ND GEN 32G X 4 MM MISC, See admin instructions., Disp: , Rfl:    blood glucose meter kit and supplies KIT, Test blood sugar 3 times daily. Dx code: E11.65, Disp: 1 each, Rfl: 0   carvedilol  (COREG ) 25 MG tablet, TAKE 1 TABLET(25 MG) BY MOUTH TWICE DAILY WITH FOOD, Disp: 180 tablet, Rfl: 1   clonazePAM  (KLONOPIN ) 0.5 MG tablet, Take 0.5 mg by mouth 2 (two) times daily as needed. (Patient not taking: Reported on 12/12/2022), Disp: , Rfl:    COMBIVENT  RESPIMAT 20-100 MCG/ACT AERS respimat, INHALE 1 PUFF INTO THE LUNGS EVERY 6 HOURS, Disp: 4 g, Rfl: 1   desvenlafaxine  (PRISTIQ ) 100 MG 24 hr  tablet, TAKE 1 TABLET BY MOUTH EVERY DAY, Disp: 30 tablet, Rfl: 5   empagliflozin  (JARDIANCE ) 25 MG TABS tablet, Take 1 tablet (25 mg total) by mouth daily before breakfast., Disp: 90 tablet, Rfl: 1   fenofibrate  160 MG tablet, TAKE 1 TABLET(160 MG) BY MOUTH EVERY DAY, Disp: 90 tablet, Rfl: 1   glucose blood test strip, Check FSBS 3-4 times per day, Disp: 100 each, Rfl: 12   HUMULIN  70/30 (70-30) 100 UNIT/ML injection, INJECT 60 UNITS UNDER THE SKIN IN THE AM AND 50 UNITS AT NIGHT, Disp: 40 mL, Rfl: 1  hydrochlorothiazide  (HYDRODIURIL ) 25 MG tablet, Take 1 tablet (25 mg total) by mouth every other day., Disp: 45 tablet, Rfl: 3   ibuprofen  (ADVIL ) 600 MG tablet, Take 1 tablet (600 mg total) by mouth every 6 (six) hours as needed. (Patient not taking: Reported on 12/12/2022), Disp: 30 tablet, Rfl: 0   lisinopril  (ZESTRIL ) 20 MG tablet, TAKE 1 TABLET(20 MG) BY MOUTH TWICE DAILY, Disp: 180 tablet, Rfl: 3   metFORMIN  (GLUCOPHAGE -XR) 500 MG 24 hr tablet, TAKE 2 TABLETS BY MOUTH TWICE DAILY, Disp: 360 tablet, Rfl: 1   omega-3 acid ethyl esters (LOVAZA ) 1 g capsule, TAKE 2 CAPSULES BY MOUTH TWICE DAILY, Disp: 360 capsule, Rfl: 1   oxyCODONE  (ROXICODONE ) 5 MG immediate release tablet, Take 1-2 tablets (5-10 mg total) by mouth every 6 (six) hours as needed for severe pain. (Patient not taking: Reported on 12/12/2022), Disp: 18 tablet, Rfl: 0   SURE COMFORT INSULIN  SYRINGE 31G X 5/16 1 ML MISC, AS DIRECTED TWICE DAILY, Disp: 100 each, Rfl: 3   Review of Systems Review of Systems  Constitutional:  Negative for chills and fever.  Eyes:  Negative for blurred vision and double vision.  Respiratory:  Negative for cough and shortness of breath.   Cardiovascular:  Negative for chest pain, palpitations and leg swelling.  Gastrointestinal:  Negative for abdominal pain, constipation, diarrhea, nausea and vomiting.  Genitourinary:  Negative for frequency.  Musculoskeletal:  Negative for myalgias.  Skin:  Negative  for itching and rash.  Neurological:  Negative for dizziness, weakness and headaches.  Endo/Heme/Allergies:  Negative for polydipsia.       Objective:    Vitals BP 128/82   Pulse 68   Temp 98.6 F (37 C)   Resp 17   Ht 5' 8 (1.727 m)   Wt 179 lb 12.8 oz (81.6 kg)   SpO2 98%   BMI 27.34 kg/m    Physical Examination Physical Exam Constitutional:      Appearance: Normal appearance. He is not ill-appearing.  Cardiovascular:     Rate and Rhythm: Normal rate and regular rhythm.     Pulses: Normal pulses.     Heart sounds: No murmur heard.    No friction rub. No gallop.  Pulmonary:     Effort: Pulmonary effort is normal. No respiratory distress.     Breath sounds: No wheezing, rhonchi or rales.  Abdominal:     General: Abdomen is flat. Bowel sounds are normal. There is no distension.     Palpations: Abdomen is soft.     Tenderness: There is no abdominal tenderness.  Musculoskeletal:     Right lower leg: No edema.     Left lower leg: No edema.  Skin:    General: Skin is warm and dry.     Findings: No rash.  Neurological:     General: No focal deficit present.     Mental Status: He is alert and oriented to person, place, and time.  Psychiatric:        Mood and Affect: Mood normal.        Behavior: Behavior normal.        Assessment & Plan:   Essential hypertension His BP is currently controlled.  We will continue to monitor.  Microalbuminuria due to type 2 diabetes mellitus (HCC) We will check his HgBA1c today.  We will continue his current medications.  Osteoporosis with current pathological fracture with routine healing He has osteoporosis.   His calcium  level was slightly low on his  last visit.  I am going to repeat this testing and check his Vit D level.  He will remains on alendronate  at this time.    No follow-ups on file.   Selinda Fleeta Finger, MD

## 2023-01-19 LAB — HEMOGLOBIN A1C
Est. average glucose Bld gHb Est-mCnc: 108 mg/dL
Hgb A1c MFr Bld: 5.4 % (ref 4.8–5.6)

## 2023-01-19 LAB — VITAMIN D 25 HYDROXY (VIT D DEFICIENCY, FRACTURES): Vit D, 25-Hydroxy: 14.4 ng/mL — ABNORMAL LOW (ref 30.0–100.0)

## 2023-01-19 LAB — BASIC METABOLIC PANEL
BUN/Creatinine Ratio: 18 (ref 9–20)
BUN: 17 mg/dL (ref 6–24)
CO2: 22 mmol/L (ref 20–29)
Calcium: 9 mg/dL (ref 8.7–10.2)
Chloride: 105 mmol/L (ref 96–106)
Creatinine, Ser: 0.95 mg/dL (ref 0.76–1.27)
Glucose: 51 mg/dL — ABNORMAL LOW (ref 70–99)
Potassium: 4.7 mmol/L (ref 3.5–5.2)
Sodium: 139 mmol/L (ref 134–144)
eGFR: 92 mL/min/{1.73_m2} (ref >59)

## 2023-01-25 ENCOUNTER — Other Ambulatory Visit: Payer: Self-pay | Admitting: Internal Medicine

## 2023-01-29 NOTE — Progress Notes (Signed)
 His labs look good but his Vit D is low. He needs to start Vit D 1000 Units po daily.  Patient aware

## 2023-02-11 ENCOUNTER — Encounter: Payer: Self-pay | Admitting: Internal Medicine

## 2023-02-22 ENCOUNTER — Other Ambulatory Visit: Payer: Self-pay | Admitting: Internal Medicine

## 2023-02-22 NOTE — Progress Notes (Signed)
 Remote ICD transmission.

## 2023-02-24 ENCOUNTER — Other Ambulatory Visit: Payer: Self-pay | Admitting: Internal Medicine

## 2023-02-26 ENCOUNTER — Ambulatory Visit: Payer: 59 | Admitting: Physician Assistant

## 2023-03-05 ENCOUNTER — Encounter: Payer: Self-pay | Admitting: Physician Assistant

## 2023-03-05 ENCOUNTER — Ambulatory Visit: Payer: 59 | Attending: Physician Assistant | Admitting: Physician Assistant

## 2023-03-05 VITALS — BP 112/72 | HR 67 | Ht 68.0 in | Wt 179.0 lb

## 2023-03-05 DIAGNOSIS — J449 Chronic obstructive pulmonary disease, unspecified: Secondary | ICD-10-CM

## 2023-03-05 DIAGNOSIS — I428 Other cardiomyopathies: Secondary | ICD-10-CM | POA: Diagnosis not present

## 2023-03-05 DIAGNOSIS — R911 Solitary pulmonary nodule: Secondary | ICD-10-CM | POA: Diagnosis not present

## 2023-03-05 DIAGNOSIS — I4891 Unspecified atrial fibrillation: Secondary | ICD-10-CM

## 2023-03-05 DIAGNOSIS — I10 Essential (primary) hypertension: Secondary | ICD-10-CM

## 2023-03-05 DIAGNOSIS — I5022 Chronic systolic (congestive) heart failure: Secondary | ICD-10-CM

## 2023-03-05 DIAGNOSIS — R0602 Shortness of breath: Secondary | ICD-10-CM | POA: Diagnosis not present

## 2023-03-05 DIAGNOSIS — G4733 Obstructive sleep apnea (adult) (pediatric): Secondary | ICD-10-CM | POA: Diagnosis not present

## 2023-03-05 NOTE — Patient Instructions (Signed)
Medication Instructions:  NO CHANGES *If you need a refill on your cardiac medications before your next appointment, please call your pharmacy*   Lab Work: NO LABS If you have labs (blood work) drawn today and your tests are completely normal, you will receive your results only by: MyChart Message (if you have MyChart) OR A paper copy in the mail If you have any lab test that is abnormal or we need to change your treatment, we will call you to review the results.   Testing/Procedures:[facility]- [address] Non-Cardiac CT scanning, (CAT scanning), is a noninvasive, special x-ray that produces cross-sectional images of the body using x-rays and a computer. CT scans help physicians diagnose and treat medical conditions. For some CT exams, a contrast material is used to enhance visibility in the area of the body being studied. CT scans provide greater clarity and reveal more details than regular x-ray exams.    Follow-Up: At [facility], you and your health needs are our priority.  As part of our continuing mission to provide you with exceptional heart care, we have created designated Provider Care Teams.  These Care Teams include your primary Cardiologist (physician) and Advanced Practice Providers (APPs -  Physician Assistants and Nurse Practitioners) who all work together to provide you with the care you need, when you need it.  Your next appointment:   1 year(s)  Provider:   Nicki Guadalajara, MD

## 2023-03-05 NOTE — Progress Notes (Unsigned)
Cardiology Office Note:  .   Date:  03/06/2023  ID:  Justin Ray, DOB 06-Jun-1965, MRN [id number] PCP: Crist Fat, MD  [facility] Providers Cardiologist:  Nicki Guadalajara, MD Electrophysiologist:  Lewayne Bunting, MD     History of Present Illness: .   Justin Ray is a 58 y.o. male with a hx of NICM s/p [employer] ICD, OSA, HTN, HLD, and DM II on insulin.  Patient was previously followed by [facility] cardiologist however later established with Dr. Tresa Endo.  He has a longstanding history of chronic systolic heart failure felt to be nonischemic in nature.  He underwent ICD implantation in January 2006 and had a generator change out in November 2014 by Dr. Ladona Ridgel.  He had a sleep study in June 2017 which revealed upper airway resistance syndrome with AHI of 2.6/h.  O2 saturation nadir 85%.  EF has improved to 50 to 55% by echocardiogram in October 2018.  He has a history of difficult to treat hypertriglyceridemia and has been taking Vascepa.  Heart monitor performed in March 2020 showed no evidence of atrial fibrillation, no significant irregular rhythm.  Repeat echocardiogram in September 2021 showed EF 55 to 60%, grade 1 DD, mild aortic sclerosis without stenosis.  I previously encouraged him to consider a repeat sleep study so he can get back on the CPAP machine, however this has been delayed.  He was recently seen by Dr. Ladona Ridgel of EP service in November 2024 at which time he was doing well.  Patient presents today for follow-up.  He denies any exertional chest pain or shortness of breath.  He has occasional fleeting pain that only last a few seconds at a time and does not correlated with degree of activity.  He also has occasional fluttering sensation in the chest.  Recent device interrogation shows he has occasional nonsustained SVT that may last at the longest 1 minute and 42 seconds.  He is on carvedilol 25 mg twice a day.  He has no lower extremity edema.  He continues to smoke.  I  highly encouraged him to stop smoking.  He likely has undiagnosed COPD.  He is interested in a referral to pulmonology service.  His O2 saturation is hovering between 89% to 92% on room air.  I will refer him to Dr. Vassie Loll.  Previous CT of the chest in September 2024 showed lung nodule, he is due for repeat chest CT to follow-up on the lung nodule.  I will give him a temporary parking placard since he cannot walk longer than 200 feet without getting short of breath.  ROS:   He denies chest pain, palpitations, dyspnea, pnd, orthopnea, n, v, dizziness, syncope, edema, weight gain, or early satiety. All other systems reviewed and are otherwise negative except as noted above.    Studies Reviewed: Marland Kitchen   EKG Interpretation Date/Time:  Tuesday March 05 2023 09:02:58 EST Ventricular Rate:  67 PR Interval:  130 QRS Duration:  94 QT Interval:  406 QTC Calculation: 429 R Axis:   62  Text Interpretation: Normal sinus rhythm Normal ECG When compared with ECG of 27-Sep-2022 18:07, PREVIOUS ECG IS PRESENT Confirmed by Azalee Course [phone]) on 03/05/2023 9:12:34 AM    Cardiac Studies & Procedures   ______________________________________________________________________________________________     ECHOCARDIOGRAM  ECHOCARDIOGRAM COMPLETE 10/02/2019  Narrative ECHOCARDIOGRAM REPORT    Patient Name:   Justin Ray Date of Exam: 10/02/2019 Medical Rec #:  [id number]      Height:  68.0 in Accession #:    [id number]     Weight:       187.0 lb Date of Birth:  1965-05-06      BSA:          1.986 m Patient Age:    54 years       BP:           142/82 mmHg Patient Gender: M              HR:           80 bpm. Exam Location:  [address]  Procedure: 2D Echo, 3D Echo, Cardiac Doppler, Color Doppler and Strain Analysis  Indications:    I50.21 Acute systolic congestive heart failure  History:        Patient has prior history of Echocardiogram examinations, most recent 03/03/2018. Defibrillator; Risk  Factors:Hypertension and Dyslipidemia. Sleep apnea. Congestive heart failure. Cardiomyopathy. Anxiety.  Sonographer:    Sedonia Small Rodgers-Jones RDCS Referring Phys: [id number] THOMAS A KELLY  IMPRESSIONS   1. Left ventricular ejection fraction, by estimation, is 55 to 60%. The left ventricle has normal function. The left ventricle has no regional wall motion abnormalities. Left ventricular diastolic parameters are consistent with Grade I diastolic dysfunction (impaired relaxation). The average left ventricular global longitudinal strain is -20.8 %. The global longitudinal strain is normal. 2. Right ventricular systolic function is normal. The right ventricular size is mildly enlarged. 3. The mitral valve is normal in structure. Trivial mitral valve regurgitation. No evidence of mitral stenosis. 4. The aortic valve is tricuspid. Aortic valve regurgitation is not visualized. Mild aortic valve sclerosis is present, with no evidence of aortic valve stenosis. 5. The inferior vena cava is normal in size with greater than 50% respiratory variability, suggesting right atrial pressure of 3 mmHg.  FINDINGS Left Ventricle: Left ventricular ejection fraction, by estimation, is 55 to 60%. The left ventricle has normal function. The left ventricle has no regional wall motion abnormalities. The average left ventricular global longitudinal strain is -20.8 %. The global longitudinal strain is normal. The left ventricular internal cavity size was normal in size. There is no left ventricular hypertrophy. Left ventricular diastolic parameters are consistent with Grade I diastolic dysfunction (impaired relaxation). Normal left ventricular filling pressure.  Right Ventricle: The right ventricular size is mildly enlarged. No increase in right ventricular wall thickness. Right ventricular systolic function is normal.  Left Atrium: Left atrial size was normal in size.  Right Atrium: Right atrial size was normal in  size.  Pericardium: There is no evidence of pericardial effusion.  Mitral Valve: The mitral valve is normal in structure. Trivial mitral valve regurgitation. No evidence of mitral valve stenosis.  Tricuspid Valve: The tricuspid valve is normal in structure. Tricuspid valve regurgitation is not demonstrated. No evidence of tricuspid stenosis.  Aortic Valve: The aortic valve is tricuspid. Aortic valve regurgitation is not visualized. Mild aortic valve sclerosis is present, with no evidence of aortic valve stenosis.  Pulmonic Valve: The pulmonic valve was normal in structure. Pulmonic valve regurgitation is not visualized. No evidence of pulmonic stenosis.  Aorta: The aortic root is normal in size and structure.  Venous: The inferior vena cava is normal in size with greater than 50% respiratory variability, suggesting right atrial pressure of 3 mmHg.  IAS/Shunts: No atrial level shunt detected by color flow Doppler.  Additional Comments: A pacer wire is visualized.   LEFT VENTRICLE PLAX 2D LVIDd:         4.80  cm  Diastology LVIDs:         3.40 cm  LV e' medial:    6.96 cm/s LV PW:         1.00 cm  LV E/e' medial:  7.9 LV IVS:        1.00 cm  LV e' lateral:   5.33 cm/s LVOT diam:     2.50 cm  LV E/e' lateral: 10.4 LV SV:         77 LV SV Index:   39       2D Longitudinal Strain LVOT Area:     4.91 cm 2D Strain GLS (A2C):   -20.6 % 2D Strain GLS (A3C):   -20.4 % 2D Strain GLS (A4C):   -21.3 % 2D Strain GLS Avg:     -20.8 %  3D Volume EF: 3D EF:        51 % LV EDV:       142 ml LV ESV:       69 ml LV SV:        72 ml  RIGHT VENTRICLE RV Basal diam:  4.40 cm RV S prime:     13.20 cm/s TAPSE (M-mode): 2.2 cm  LEFT ATRIUM             Index       RIGHT ATRIUM           Index LA diam:        4.40 cm 2.22 cm/m  RA Area:     15.40 cm LA Vol (A2C):   61.1 ml 30.76 ml/m RA Volume:   41.80 ml  21.05 ml/m LA Vol (A4C):   55.2 ml 27.79 ml/m LA Biplane Vol: 58.9 ml 29.65  ml/m AORTIC VALVE LVOT Vmax:   79.20 cm/s LVOT Vmean:  52.300 cm/s LVOT VTI:    0.157 m  AORTA Ao Root diam: 3.40 cm  MITRAL VALVE MV Area (PHT): 3.77 cm    SHUNTS MV Decel Time: 201 msec    Systemic VTI:  0.16 m MV E velocity: 55.20 cm/s  Systemic Diam: 2.50 cm MV A velocity: 67.50 cm/s MV E/A ratio:  0.82  Armanda Magic MD Electronically signed by Armanda Magic MD Signature Date/Time: 10/02/2019/2:33:23 PM    Final    MONITORS  CARDIAC EVENT MONITOR 03/26/2018  Narrative The patient was monitored from March 26, 2018 through April 24, 2018.  The predominant rhythm was sinus rhythm with the average heart rate  86 bpm.  The minimum heart rate was sinus bradycardia 53 bpm on March 14 at 7:43 AM.  The maximum heart rate was sinus tachycardia at 129 bpm on March 21 at 8:36 AM.  No significant ectopy was appreciated with less than 1% PAC and PVC.  No episodes of atrial fibrillation, SVT or VT. Tthere were no significant pauses.       ______________________________________________________________________________________________      Risk Assessment/Calculations:             Physical Exam:   VS:  BP 112/72   Pulse 67   Ht 5\' 8"  (1.727 m)   Wt 179 lb (81.2 kg)   SpO2 92%   BMI 27.22 kg/m    Wt Readings from Last 3 Encounters:  03/05/23 179 lb (81.2 kg)  01/18/23 179 lb 12.8 oz (81.6 kg)  12/12/22 181 lb 3.2 oz (82.2 kg)    GEN: Well nourished, well developed in no acute distress NECK: No JVD; No carotid bruits CARDIAC:  RRR, no murmurs, rubs, gallops RESPIRATORY:  Clear to auscultation without rales, wheezing or rhonchi  ABDOMEN: Soft, non-tender, non-distended EXTREMITIES:  No edema; No deformity   ASSESSMENT AND PLAN: .     Non-ischemic Cardiomyopathy Improved EF to 55-60% on recent echocardiogram. No exertional chest pain or shortness of breath. Occasional fleeting chest pain lasting a few seconds. Occasional fluttering sensation in the chest. Recent device  interrogation shows occasional non-sustained SVT with longest duration of 1 minute and 42 seconds. -Continue Carvedilol 25mg  twice daily. -Continue current cardiac medications. -Follow-up in one year or earlier if issues arise.  Shortness of breath: Likely undiagnosed chronic Obstructive Pulmonary Disease (COPD) Patient reports difficulty walking more than 200 feet without getting short of breath. Patient continues to smoke. -Refer to Pulmonology service for further evaluation and management. -Encourage smoking cessation. -Issue temporary disability parking placard due to limited mobility.  Lung Nodules Identified on previous CT chest in September 2024. Patient continues to smoke. -Order repeat chest CT without contrast to follow up on lung nodules.  Sleep Apnea Previous sleep study in June 2017 revealed upper airway resistance syndrome. Patient not currently on CPAP. -Consider repeat sleep study when patient is ready and insurance allows.        Dispo: Follow-up in 1 year  Signed, Azalee Course, [place]

## 2023-03-06 ENCOUNTER — Other Ambulatory Visit: Payer: Self-pay | Admitting: Internal Medicine

## 2023-03-17 ENCOUNTER — Other Ambulatory Visit: Payer: Self-pay | Admitting: Internal Medicine

## 2023-03-17 DIAGNOSIS — J441 Chronic obstructive pulmonary disease with (acute) exacerbation: Secondary | ICD-10-CM

## 2023-03-24 ENCOUNTER — Other Ambulatory Visit: Payer: Self-pay | Admitting: Internal Medicine

## 2023-04-01 ENCOUNTER — Ambulatory Visit
Admission: RE | Admit: 2023-04-01 | Discharge: 2023-04-01 | Disposition: A | Discharge status: Home / Self Care | Payer: 59 | Source: Ambulatory Visit | Attending: Physician Assistant | Admitting: Physician Assistant

## 2023-04-15 ENCOUNTER — Other Ambulatory Visit: Payer: Self-pay | Admitting: Internal Medicine

## 2023-04-17 ENCOUNTER — Ambulatory Visit: Payer: 59 | Admitting: Internal Medicine

## 2023-04-17 ENCOUNTER — Encounter: Payer: Self-pay | Admitting: Internal Medicine

## 2023-04-17 VITALS — BP 130/70 | HR 95 | Temp 98.4°F | Resp 18 | Ht 68.0 in | Wt 177.0 lb

## 2023-04-17 DIAGNOSIS — M4802 Spinal stenosis, cervical region: Secondary | ICD-10-CM

## 2023-04-17 DIAGNOSIS — E1129 Type 2 diabetes mellitus with other diabetic kidney complication: Secondary | ICD-10-CM

## 2023-04-17 DIAGNOSIS — I10 Essential (primary) hypertension: Secondary | ICD-10-CM

## 2023-04-17 DIAGNOSIS — Z87891 Personal history of nicotine dependence: Secondary | ICD-10-CM | POA: Diagnosis not present

## 2023-04-17 DIAGNOSIS — Z6826 Body mass index (BMI) 26.0-26.9, adult: Secondary | ICD-10-CM | POA: Insufficient documentation

## 2023-04-17 DIAGNOSIS — Z0000 Encounter for general adult medical examination without abnormal findings: Secondary | ICD-10-CM

## 2023-04-17 DIAGNOSIS — F411 Generalized anxiety disorder: Secondary | ICD-10-CM

## 2023-04-17 DIAGNOSIS — R911 Solitary pulmonary nodule: Secondary | ICD-10-CM | POA: Insufficient documentation

## 2023-04-17 DIAGNOSIS — R809 Proteinuria, unspecified: Secondary | ICD-10-CM

## 2023-04-17 DIAGNOSIS — I5022 Chronic systolic (congestive) heart failure: Secondary | ICD-10-CM

## 2023-04-17 DIAGNOSIS — J449 Chronic obstructive pulmonary disease, unspecified: Secondary | ICD-10-CM | POA: Insufficient documentation

## 2023-04-17 DIAGNOSIS — F330 Major depressive disorder, recurrent, mild: Secondary | ICD-10-CM

## 2023-04-17 DIAGNOSIS — Z95810 Presence of automatic (implantable) cardiac defibrillator: Secondary | ICD-10-CM | POA: Insufficient documentation

## 2023-04-17 DIAGNOSIS — I4891 Unspecified atrial fibrillation: Secondary | ICD-10-CM

## 2023-04-17 DIAGNOSIS — F17210 Nicotine dependence, cigarettes, uncomplicated: Secondary | ICD-10-CM

## 2023-04-17 DIAGNOSIS — I428 Other cardiomyopathies: Secondary | ICD-10-CM

## 2023-04-17 DIAGNOSIS — E113293 Type 2 diabetes mellitus with mild nonproliferative diabetic retinopathy without macular edema, bilateral: Secondary | ICD-10-CM | POA: Diagnosis not present

## 2023-04-17 DIAGNOSIS — G4733 Obstructive sleep apnea (adult) (pediatric): Secondary | ICD-10-CM

## 2023-04-17 DIAGNOSIS — M810 Age-related osteoporosis without current pathological fracture: Secondary | ICD-10-CM | POA: Insufficient documentation

## 2023-04-17 DIAGNOSIS — N400 Enlarged prostate without lower urinary tract symptoms: Secondary | ICD-10-CM | POA: Insufficient documentation

## 2023-04-17 DIAGNOSIS — E782 Mixed hyperlipidemia: Secondary | ICD-10-CM

## 2023-04-17 MED ORDER — CLONAZEPAM 0.5 MG PO TABS: 0.5000 mg | ORAL_TABLET | Freq: Two times a day (BID) | ORAL | 0 refills | Status: DC | PRN

## 2023-04-17 NOTE — Assessment & Plan Note (Signed)
 He has not seen Urology in 4-5 years.  He denies any problems with urination.  We will check a PSA today.

## 2023-04-17 NOTE — Assessment & Plan Note (Signed)
 He has Atrial Fib that is stable and has an AICD in place.  He did see EP in 11/2022.  He remains on coreg and he is on an ASA.

## 2023-04-17 NOTE — Assessment & Plan Note (Signed)
 They did interrogate his AICD on his cardiology visit in 02/2023.  He sees EP and they noted in 11/2022 that he had 2.5 years left on his device.

## 2023-04-17 NOTE — Assessment & Plan Note (Signed)
 His repeat CT scan done in 02/2023 showed no change in his pulmonary nodules.  He will need a repeat CT scan in 1 year.

## 2023-04-17 NOTE — Assessment & Plan Note (Signed)
 He had a history of rib fracture where we did a bone density that showed osteoporosis.  He remains on alendronate at this time and remains on calcium and Vit D supplmentation.  He is to continue with weight bearing exercises with his walking. We will repeat a bone density in 2-3 years.

## 2023-04-17 NOTE — Assessment & Plan Note (Signed)
 Health maintenance discussed.  We will obtain some yearly labs.

## 2023-04-17 NOTE — Assessment & Plan Note (Signed)
 We will obtain a FLP on him today.  He has probable familial hypercholesterolemia.

## 2023-04-17 NOTE — Assessment & Plan Note (Signed)
I have asked him to eat healthy and exercise.

## 2023-04-17 NOTE — Assessment & Plan Note (Signed)
 Plan as below.

## 2023-04-17 NOTE — Assessment & Plan Note (Signed)
 He will need to followup with ophthamlology as directed.

## 2023-04-17 NOTE — Assessment & Plan Note (Signed)
 He has had neck surgery in the past.  He denies any problems with pain or radiculopathy at this time.

## 2023-04-17 NOTE — Assessment & Plan Note (Signed)
 He has had a diagnosis of COPD since 2006 and has seen a pulmonologist in the past.  Cardiology has referred him back to pulmonary.  The patient does not have a maintenance inhaler but uses combivent as needed.  I want him to cut back on cigarette smoking.

## 2023-04-17 NOTE — Assessment & Plan Note (Signed)
 We have a discussion about cutting back and stopping cigarette use.  He was noted to have some coronary calcifications and we discussed this and I really want him to stop smoking.

## 2023-04-17 NOTE — Assessment & Plan Note (Signed)
 He has a history of OSA and has had a sleep study in the past.  He does not wear a CPAP and is noncompliant with the one at home.  He will see pulmonary to discuss this as well.

## 2023-04-17 NOTE — Assessment & Plan Note (Signed)
 His diabetes has been controlled.  He denies any further hypoglycemia.  We will continue his current medications.  His diabetic foot exam was normal.  We will check his diabetic labs today.

## 2023-04-17 NOTE — Progress Notes (Signed)
 Office Visit  Subjective   Patient ID: Justin Ray   DOB: 1965-01-24   Age: 58 y.o.   MRN: [id number]   Chief Complaint Chief Complaint  Patient presents with   Annual Exam    Physical     History of Present Illness Justin Ray is a 58 year old Caucasian/White male who presents for his annual health maintenance exam. He is due for the following health maintenance studies: screening labs. This patient's past medical history AICD Placement, Anxiety Disorder, Generalized, Atrial Fibrillation, Basal Cell Carcinoma, COPD, Depression, Diabetes Mellitus, Type II, Diabetic Retinopathy, Hypercholesterolemia, Hypertension, Non-ischemic Cardiomyopathy, Obstructive Sleep Apnea, Systolic heart failure; chronic, Tobacco Abuse, and Ventricular Tachycardia.    He did have a dilated diabetic eye exam done on 12/08/2022 with Dr. Mitzi Davenport at [facility] where he has mild diabetic retinopathy in both eyes that is improved per their notes.  He denies any problems with his vision. His last colonoscopy was in 12/18/2019 which showed some adenomatous polyps, diverticular disease and int/ext hemorrhoids. They want to repeat his colonoscopy in 5 years. There is a family history of colon cancer. His prior colonoscopy was in 2016 and they found 2 polyps and at that time. He does have a history of BPH and was followed by urology who last saw him on 06/2019. They were following his PSA levels. He denies any problems with urination today. He does exercise daily by walking regularly.  He does smoke (see below).  He does get yearly flu vaccines. He had a pneumovax 23 vaccine in 2017 and a shingrix vaccine in 10/2016. He has not had any of the COVID-19 vaccines. He has a family history of heart disease in his father and grandmother. He is on an ASA 81mg  daily.  The patient is a 58 year old Caucasian/White male who returns for a follow-up visit for his T2 diabetes.  This past year, we noted on a spot FSBS that his sugar  was 48.  He denied any problems with hypoglycemia episodes at that time but I did cut back his humulin 70/30 from 65 Units in AM to 60 Units in AM and his 55 Units in PM down to 50 Units in the PM.  He denies any hypoglycemia since his last visit.  He has a G-voke pen but has not had to use this.  This past year, we had him discontinue his victoza as his A1c has been well controlled.  He is watching his diet and has been walking for exercise.  He remains on metformin ER 2000mg  daily, jardiance 25mg  daily and humulin 70/30 with 60 Units in AM and 50 Units in PM.  He specifically denies unexplained abdominal pain, nausea, vomiting or diarrhea.  He states his FSBS are running between 70-110 where he check his FSBS once a day.   He states he tries to eats 2 meals per day but eats snacks and he is now eating a pack of crackers at night before bed.  His last HgBA1c was done 3months ago and was 5.4%.  He came in fasting today in anticipation of lab work.  He denies any diabetic neuropathy, nephropathy or cardiovascular disease.  He was noted to have some microalbuminuria in the past.  He did have a dilated diabetic eye exam done on 12/08/2022 with Dr. Mitzi Davenport at [facility]  where he has mild diabetic retinopathy in both eyes that is improved per their notes.   The patient is a 58 year old Caucasian/White male  who presents for a follow-up evaluation of hypertension.    The patient has been checking his blood pressure at home. The patient's blood pressure has ranged systollically in the 120's. The patient's current medications include: carvedilol 25mg  BID, hydrochlorothiazide 25mg  po every other day and lisinopril 20mg  BID. The patient has been tolerating his medications well. The patient denies any headache, visual changes, dizziness, lightheadness, chest pain, shortness of breath, weakness/numbness, and edema.  He reports there have been no other symptoms noted.  We stopped his HCTZ last year due to his dizziness  with orthostatic hypotension.  He is now back on hydrochlorothiazide.  Nancy Fetter returns today for routine followup on his cholesterol.  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  Ihis triglyceride has been repeatedly elevated.  I asked him to eat healthy, cut fats in his diet and exercise.  Overall, he states he is doing well and is without any complaints or problems at this time. He specifically denies abdominal pain, nausea, vomiting, diarrhea, myalgias, and fatigue. He remains on dietary management as well as the following cholesterol lowering medications crestor 10mg  daily, fenofibrate 160mg  daily, lovaza 2g BID.  He is fasting in anticipation for labs today.  This patient also has moderately severe chronic systolic CHF/non-ischemic cardiomyopathy which was diagnosed in 2006.   His previous ECHO was done in 2015 and this showed an EF of 45% with LVH and diastolic dysfunction.  He had a repeat ECHO done in 10/02/2019 where his LVEF improved to 55-60% with normal LV function without regional wall motion abnormalities but he did have Grade I diastolic dysfunction and no valvular abnormalities.  He had an AICD placed on 01/2004 due to ventricular tachycardia.  They interrogated his AICD in 12/2021 and this was functioning properly.  His last visit with cardiology was in 03/05/2023 where he again was having SOB.  He felt that the patient had undiagnosed COPD (the patient does have a diagnosis of COPD) and reported walking more than 200 feet without getting SOB.  He referred the patient to pulmonary and wanted to repeat his CT scan to look at these lung nodules they saw in 09/2022.  They noted that he had not used his CPAP in about a year and they are setting him up to have a repeat sleep study.   Today, he denies any CP, palpitations, SOB, or edema. His CHF has been in a compensated state on medications as noted in the medication list. He has the following baseline symptoms:  exertional dyspnea where he cannot go up an entire flight of stairs without SOB. He states if it is level ground he can "walk for a while without getting SOB". Specifically denied complaints: worsening orthopnea, worsening edema, and worsening exertional dyspnea.  This patient also has atrial fibrillation diagnosed in 2004 and presents today for a status visit. He had SOB at that time and ended up with a heart catherization. He has had 2 defibrillator placements in the past. His atrial fibrillation is controlled with therapy as summarized in the medication list and previous notes. He is on coreg 25mg  BID. He was on digoxin but he states that cardiology took him off this in 2023.  Specifically denied complaints: chest pain, palpitations, TIAs, edema, and syncope. Interval history: none. Interval studies: none. Anticoagulation status: ASA 81mg  daily.  He last saw electrophysiology with Dr. Ladona Ridgel in 11/2022 and they noted he had 2.5 years of longevity left with his AICD.   Renee Beale is  a 58 year old male who returns for followup of his osteoporosis.  He was seen in the ER in 09/2022 after a right rib injury from a fall.  A workup was completed, and the CT without contrast scan exhibited an acute minimally displaced fracture of the right ninth rib posterolaterally.  The scan also revealed a 13 mm groundglass pulmonary nodule within the right apex.  The patient is recommended to follow-up in 6 months for a CT scan to confirm persistence.  He does not know if there is a family history of osteoporosis.  He has broken his ribs once in the past before this episode in 09/2022.   The following risk factors are noted: smoking and diabetes.  He denies the following: high caffeine intake, alcohol consumption of more that 7 ounces per week, daily prednisone use, hyperthyroidism, surgical resection of his bowel, and surgical resection of his stomach. He states she exercises routinely by walking.  We did a bone density on  11/30/2022 and this showed a t-score of the lumbar spine at -2.7 with osteoporosis.  We did start him on alendronate in 12/2022.  The patient is a 58 year old Caucasian/White male with past medical history of COPD.  This past year he had a COPD exacerbation in 07/2022 which we treated as an outpatient and he recovered.  He was diagnosed with COPD in 2006. In 2006, he was having SOB and was sent to the pulmonologist who did spirometry and told him he probably had COPD but his heart was his problem where he has chronic systolic CHF with non-ischemic cardiomyopathy. They placed him on nebulization treatment at that time but he has not been on this for a while.  He does have a combivent inhaler which he uses 3-4 times a day and does help.  He was smoking 1.5- 2 ppd but had cut back down to 1 ppd of cigarettes which he cut back in 01/2023.  He started smoking at age 71. He has tried chantix in the past but this gave him nightmares. He also has tried nicotine patches in the past.  He also has a history of OSA where he was snoring and not well rested in the morning. He was having frequent awakenings at night. He was sent for a sleep study in 2016 but this was nonconclusive and his cardiologist is thinking about repeating his sleep study.  He has been noncompliant with his CPAP for the past year.  See PMH for summary of pulmonary history and lab reports for status of any previous PFTs: COPD and Tobacco Abuse. Comorbid conditions : active smoker., CHF., and atrial fibrillation. He states he can go a few steps and will get SOB.  He has the following modifiable risk factors: smoking, CHF, sedentary lifestyle, and obesity. Specifically denied complaints: productive cough, change in color of sputum, pleuritic chest pain, purulent sputum, hemoptysis, and fever.  Cardiology did repeat a CT scan of his chest on 03/05/2023 for the previous noted pulmonary nodules from his CT chest in 09/2022.  His recent CT of chest on 03/05/2023  showed no significant change in the ill-defined semi-solid ground-glass appearing nodular parenchymal opacity of the apical segment of the right upper lobe measuring 17 x 14 mm. Lung-RADS 2, benign appearance or behavior. Continue annual screening with low-dose chest CT without contrast in 12 months.  The previously described 8 mm possibly inflammatory nodule in the right lower lobe is similar measuring 7 mm in maximum diameter. May represent some fibrotic  post tori changes.  There was extensive coronary artery calcifications         The patient also returns for followup of his depression and anxiety.  He may also have some PTSD in addition to his depression and anxiety.   Today, he states his anxiety and depression are both mild.  He remains on abilify 2mg  daily as an adjunctive med for his depression and he also remains on Pristiq 100mg  daily.  He has not used clonazepam prn for over a year.  The symptoms have been present for 20 years. He states alot of his depression and anxiety comes from the death of his wife in 04-26-2007. He has been on pristiq for a number of years.  He used to be on effexor. He denies any recent panic attacks. He denies additional symptoms, difficulty concentrating, difficulty performing routine daily activities, fatigue, extreme feelings of guilt, feelings of isolation, feelings of worthlessness, helpless feeling, suicidal ideation, homicidal ideation, weight loss, insomnia, loss of appetite, social withdrawal, loss of interest in pleasurable activities, and out of control feelings. This patient feels that she is able to care for himself. There are no predisposing factors for depression or anxiety. He currently lives alone. He has no significant prior history of mental health disorders.      The patient also has a history of neck pain which was mild and he is not having any problems with this neck pain at this time.  He had left sided cervical radiculopathy with spinal stenosis of C6-C7.   He was having weakness in his arm/hand and underwent ACDF of C6-C7 in 02/2017 with neurosurgery. He still has some residual left hand weakness but this is improved after surgery.  This weakness has not worsened and he has rare numbness that will then go away.  He has occasional pain.  He also had a left rotator cuff tear that occurred at the same time he hurt his neck in 10/2016.  He had arthroscopic left shoulder repair on 07/03/2018.  Today, he states his neck is 100% better but his shoulder he has occasional pain.   He also has a history of basal cell carcinoma of his arm and head and ear.  He sees dermatology yearly and he states he has no skin lesions he is worried about.  His last visit in dermatology was in 12/2022 and he is following up with them regularly.       Past Medical History Past Medical History:  Diagnosis Date   AICD (automatic cardioverter/defibrillator) present    Anxiety    Basal cell carcinoma    Carpal tunnel syndrome 10/2012   right-GSC   Cataract    Chronic systolic (congestive) heart failure (HCC)    COPD (chronic obstructive pulmonary disease) (HCC)    Depression    Diabetic retinopathy associated with type 2 diabetes mellitus (HCC)    Hyperlipidemia    Hypertension    ICD (implantable cardioverter-defibrillator) in place    Non-ischemic cardiomyopathy (HCC)    OSA (obstructive sleep apnea)    Osteoporosis    Tobacco abuse    Ventricular tachycardia (HCC)      Allergies Allergies  Allergen Reactions   Codeine Shortness Of Breath and Other (See Comments)    Severe HA's.   Niaspan [Niacin Er (Antihyperlipidemic)] Other (See Comments)    Per pt feels like skin is burning( REDNESS), and pins/needles are sticking    Sulfonamide Derivatives Other (See Comments)    thrush     Medications  Current Outpatient Medications:    acetaminophen (TYLENOL) 500 MG tablet, Take 2 tablets (1,000 mg total) by mouth every 6 (six) hours as needed for up to 30 doses.,  Disp: 30 tablet, Rfl: 0   alendronate (FOSAMAX) 70 MG tablet, Take 1 tablet (70 mg total) by mouth every 7 (seven) days. Take with a full glass of water on an empty stomach., Disp: 4 tablet, Rfl: 11   ARIPiprazole (ABILIFY) 2 MG tablet, TAKE 1 TABLET(2 MG) BY MOUTH DAILY, Disp: 30 tablet, Rfl: 1   aspirin 81 MG tablet, Take 81 mg by mouth daily. , Disp: , Rfl:    BD INSULIN SYRINGE U/F 31G X 5/16" 1 ML MISC, AS DIRECTED TWICE DAILY, Disp: 100 each, Rfl: 3   BD PEN NEEDLE NANO 2ND GEN 32G X 4 MM MISC, See admin instructions., Disp: , Rfl:    blood glucose meter kit and supplies KIT, Test blood sugar 3 times daily. Dx code: E11.65, Disp: 1 each, Rfl: 0   carvedilol (COREG) 25 MG tablet, TAKE 1 TABLET(25 MG) BY MOUTH TWICE DAILY WITH FOOD, Disp: 180 tablet, Rfl: 1   clonazePAM (KLONOPIN) 0.5 MG tablet, Take 0.5 mg by mouth 2 (two) times daily as needed., Disp: , Rfl:    COMBIVENT RESPIMAT 20-100 MCG/ACT AERS respimat, INHALE 1 PUFF INTO THE LUNGS EVERY 6 HOURS, Disp: 4 g, Rfl: 1   desvenlafaxine (PRISTIQ) 100 MG 24 hr tablet, TAKE 1 TABLET BY MOUTH EVERY DAY, Disp: 30 tablet, Rfl: 5   fenofibrate 160 MG tablet, TAKE 1 TABLET(160 MG) BY MOUTH EVERY DAY, Disp: 90 tablet, Rfl: 1   glucose blood test strip, Check FSBS 3-4 times per day, Disp: 100 each, Rfl: 12   HUMULIN 70/30 (70-30) 100 UNIT/ML injection, INJECT 60 UNITS UNDER THE SKIN IN THE AM AND 50 UNITS AT NIGHT, Disp: 40 mL, Rfl: 1   hydrochlorothiazide (HYDRODIURIL) 25 MG tablet, TAKE 1 TABLET(25 MG) BY MOUTH EVERY OTHER DAY, Disp: 45 tablet, Rfl: 3   JARDIANCE 25 MG TABS tablet, TAKE 1 TABLET(25 MG) BY MOUTH DAILY BEFORE BREAKFAST, Disp: 90 tablet, Rfl: 1   lisinopril (ZESTRIL) 20 MG tablet, TAKE 1 TABLET(20 MG) BY MOUTH TWICE DAILY, Disp: 180 tablet, Rfl: 3   metFORMIN (GLUCOPHAGE-XR) 500 MG 24 hr tablet, TAKE 2 TABLETS BY MOUTH TWICE DAILY, Disp: 360 tablet, Rfl: 1   omega-3 acid ethyl esters (LOVAZA) 1 g capsule, TAKE 2 CAPSULES BY MOUTH TWICE  DAILY, Disp: 360 capsule, Rfl: 1   Review of Systems ROS     Objective:    Vitals BP 130/70   Pulse 95   Temp 98.4 F (36.9 C)   Resp 18   Ht 5\' 8"  (1.727 m)   Wt 177 lb (80.3 kg)   SpO2 95%   BMI 26.91 kg/m    Physical Examination Physical Exam Constitutional:      Appearance: Normal appearance. He is not ill-appearing.  HENT:     Head: Normocephalic and atraumatic.     Right Ear: Tympanic membrane, ear canal and external ear normal.     Left Ear: Tympanic membrane, ear canal and external ear normal.     Nose: Nose normal. No congestion or rhinorrhea.     Mouth/Throat:     Mouth: Mucous membranes are moist.     Pharynx: Oropharynx is clear. No oropharyngeal exudate or posterior oropharyngeal erythema.  Eyes:     General: No scleral icterus.    Conjunctiva/sclera: Conjunctivae normal.  Pupils: Pupils are equal, round, and reactive to light.  Neck:     Vascular: No carotid bruit.  Cardiovascular:     Rate and Rhythm: Normal rate and regular rhythm.     Pulses: Normal pulses.     Heart sounds: No murmur heard.    No friction rub. No gallop.  Pulmonary:     Effort: Pulmonary effort is normal. No respiratory distress.     Breath sounds: No wheezing, rhonchi or rales.  Abdominal:     General: Abdomen is flat. Bowel sounds are normal. There is no distension.     Palpations: Abdomen is soft.     Tenderness: There is no abdominal tenderness.  Musculoskeletal:     Cervical back: Neck supple. No tenderness.     Right lower leg: No edema.     Left lower leg: No edema.  Lymphadenopathy:     Cervical: No cervical adenopathy.  Skin:    General: Skin is warm and dry.     Findings: No rash.  Neurological:     General: No focal deficit present.     Mental Status: He is alert and oriented to person, place, and time.  Psychiatric:        Mood and Affect: Mood normal.        Behavior: Behavior normal.        Assessment & Plan:   Atrial fibrillation [facility]) He has  Atrial Fib that is stable and has an AICD in place.  He did see EP in 11/2022.  He remains on coreg and he is on an ASA.  Chronic systolic heart failure He has a history of chronic systolic systolic CHF where his ECHO in 2015 showed an EF of 45% but this improved on his ECHO from 09/2019 where his EF went to 55-60%.  There is no evidence of decompensation today.  He did see his cardiologist in 02/2023 and he was doing well.  We will continue on his ACE-I, BB, hydrochlorothiazide and jardiance.  Cardiology will see him back in 1 year.  Essential hypertension His BP is well controlled.  He is not having anymore dizziness like he had 2-3 years ago.  We will continue his current medications.  NICM (nonischemic cardiomyopathy) (HCC) Again, his cardiomyopathy improved from his ECHO in 09/2019.  There is no evidence of decompensation.  Chronic obstructive pulmonary disease (HCC) He has had a diagnosis of COPD since 2006 and has seen a pulmonologist in the past.  Cardiology has referred him back to pulmonary.  The patient does not have a maintenance inhaler but uses combivent as needed.  I want him to cut back on cigarette smoking.  OSA (obstructive sleep apnea) He has a history of OSA and has had a sleep study in the past.  He does not wear a CPAP and is noncompliant with the one at home.  He will see pulmonary to discuss this as well.  Pulmonary nodule His repeat CT scan done in 02/2023 showed no change in his pulmonary nodules.  He will need a repeat CT scan in 1 year.  Microalbuminuria due to type 2 diabetes mellitus (HCC) His diabetes has been controlled.  He denies any further hypoglycemia.  We will continue his current medications.  His diabetic foot exam was normal.  We will check his diabetic labs today.  Mild nonproliferative diabetic retinopathy of both eyes without macular edema associated with type 2 diabetes mellitus (HCC) He will need to followup with ophthamlology as  directed.  Osteoporosis without current pathological fracture He had a history of rib fracture where we did a bone density that showed osteoporosis.  He remains on alendronate at this time and remains on calcium and Vit D supplmentation.  He is to continue with weight bearing exercises with his walking. We will repeat a bone density in 2-3 years.  Benign prostatic hyperplasia without lower urinary tract symptoms He has not seen Urology in 4-5 years.  He denies any problems with urination.  We will check a PSA today.  AICD (automatic cardioverter/defibrillator) present They did interrogate his AICD on his cardiology visit in 02/2023.  He sees EP and they noted in 11/2022 that he had 2.5 years left on his device.  Annual physical exam Health maintenance discussed.  We will obtain some yearly labs.  BMI 26.0-26.9,adult I have asked him to eat healthy and exercise.  Cervical spinal stenosis He has had neck surgery in the past.  He denies any problems with pain or radiculopathy at this time.  Cigarette smoker We have a discussion about cutting back and stopping cigarette use.  He was noted to have some coronary calcifications and we discussed this and I really want him to stop smoking.  GAD (generalized anxiety disorder) Plan as below.  Hyperlipidemia, mixed We will obtain a FLP on him today.  He has probable familial hypercholesterolemia.    Mild episode of recurrent major depressive disorder (HCC) He has mild recurrent major depression and GAD which is controlled on his current dose of ability and pristiq.    Return in about 3 months (around 07/17/2023).   Crist Fat, MD

## 2023-04-17 NOTE — Assessment & Plan Note (Signed)
 His BP is well controlled.  He is not having anymore dizziness like he had 2-3 years ago.  We will continue his current medications.

## 2023-04-17 NOTE — Assessment & Plan Note (Signed)
 He has mild recurrent major depression and GAD which is controlled on his current dose of ability and pristiq.

## 2023-04-17 NOTE — Assessment & Plan Note (Addendum)
 He has a history of chronic systolic systolic CHF where his ECHO in 2015 showed an EF of 45% but this improved on his ECHO from 09/2019 where his EF went to 55-60%.  There is no evidence of decompensation today.  He did see his cardiologist in 02/2023 and he was doing well.  We will continue on his ACE-I, BB, hydrochlorothiazide and jardiance.  Cardiology will see him back in 1 year.

## 2023-04-17 NOTE — Assessment & Plan Note (Signed)
 Again, his cardiomyopathy improved from his ECHO in 09/2019.  There is no evidence of decompensation.

## 2023-04-18 LAB — CBC WITH DIFFERENTIAL/PLATELET
Basophils Absolute: 0.1 10*3/uL (ref 0.0–0.2)
Basos: 1 %
EOS (ABSOLUTE): 0.2 10*3/uL (ref 0.0–0.4)
Eos: 2 %
Hematocrit: 57.1 % — ABNORMAL HIGH (ref 37.5–51.0)
Hemoglobin: 19.1 g/dL — ABNORMAL HIGH (ref 13.0–17.7)
Immature Grans (Abs): 0.1 10*3/uL (ref 0.0–0.1)
Immature Granulocytes: 1 %
Lymphocytes Absolute: 1.8 10*3/uL (ref 0.7–3.1)
Lymphs: 20 %
MCH: 31.1 pg (ref 26.6–33.0)
MCHC: 33.5 g/dL (ref 31.5–35.7)
MCV: 93 fL (ref 79–97)
Monocytes Absolute: 0.8 10*3/uL (ref 0.1–0.9)
Monocytes: 9 %
Neutrophils Absolute: 6.2 10*3/uL (ref 1.4–7.0)
Neutrophils: 67 %
Platelets: 219 10*3/uL (ref 150–450)
RBC: 6.15 x10E6/uL — ABNORMAL HIGH (ref 4.14–5.80)
RDW: 14 % (ref 11.6–15.4)
WBC: 9.2 10*3/uL (ref 3.4–10.8)

## 2023-04-18 LAB — LIPID PANEL
Chol/HDL Ratio: 4.5 ratio (ref 0.0–5.0)
Cholesterol, Total: 147 mg/dL (ref 100–199)
HDL: 33 mg/dL — ABNORMAL LOW (ref >39)
LDL Chol Calc (NIH): 67 mg/dL (ref 0–99)
Triglycerides: 296 mg/dL — ABNORMAL HIGH (ref 0–149)
VLDL Cholesterol Cal: 47 mg/dL — ABNORMAL HIGH (ref 5–40)

## 2023-04-18 LAB — CMP14 + ANION GAP
ALT: 29 IU/L (ref 0–44)
AST: 28 IU/L (ref 0–40)
Albumin: 4.7 g/dL (ref 3.8–4.9)
Alkaline Phosphatase: 70 IU/L (ref 44–121)
Anion Gap: 20 mmol/L — ABNORMAL HIGH (ref 10.0–18.0)
BUN/Creatinine Ratio: 19 (ref 9–20)
BUN: 20 mg/dL (ref 6–24)
Bilirubin Total: 0.2 mg/dL (ref 0.0–1.2)
CO2: 18 mmol/L — ABNORMAL LOW (ref 20–29)
Calcium: 9.5 mg/dL (ref 8.7–10.2)
Chloride: 108 mmol/L — ABNORMAL HIGH (ref 96–106)
Creatinine, Ser: 1.07 mg/dL (ref 0.76–1.27)
Globulin, Total: 2.6 g/dL (ref 1.5–4.5)
Glucose: 69 mg/dL — ABNORMAL LOW (ref 70–99)
Potassium: 5.1 mmol/L (ref 3.5–5.2)
Sodium: 146 mmol/L — ABNORMAL HIGH (ref 134–144)
Total Protein: 7.3 g/dL (ref 6.0–8.5)
eGFR: 80 mL/min/{1.73_m2} (ref >59)

## 2023-04-18 LAB — PSA: Prostate Specific Ag, Serum: 0.8 ng/mL (ref 0.0–4.0)

## 2023-04-18 LAB — HEMOGLOBIN A1C
Est. average glucose Bld gHb Est-mCnc: 120 mg/dL
Hgb A1c MFr Bld: 5.8 % — ABNORMAL HIGH (ref 4.8–5.6)

## 2023-04-18 LAB — TSH: TSH: 1.18 u[IU]/mL (ref 0.450–4.500)

## 2023-04-18 LAB — VITAMIN D 25 HYDROXY (VIT D DEFICIENCY, FRACTURES): Vit D, 25-Hydroxy: 36.2 ng/mL (ref 30.0–100.0)

## 2023-04-19 ENCOUNTER — Ambulatory Visit (INDEPENDENT_AMBULATORY_CARE_PROVIDER_SITE_OTHER): Payer: 59

## 2023-04-19 ENCOUNTER — Encounter: Payer: Self-pay | Admitting: Internal Medicine

## 2023-04-19 DIAGNOSIS — I428 Other cardiomyopathies: Secondary | ICD-10-CM

## 2023-04-19 LAB — CUP PACEART REMOTE DEVICE CHECK
Battery Remaining Longevity: 30 mo
Battery Remaining Percentage: 31 %
Brady Statistic RV Percent Paced: 0 %
Date Time Interrogation Session: 20250404024400
HighPow Impedance: 72 Ohm
Implantable Lead Connection Status: 753985
Implantable Lead Implant Date: 20060117
Implantable Lead Location: 753860
Implantable Lead Model: 185
Implantable Lead Serial Number: 124785
Implantable Pulse Generator Implant Date: 20141107
Lead Channel Impedance Value: 522 Ohm
Lead Channel Pacing Threshold Amplitude: 1.8 V
Lead Channel Pacing Threshold Pulse Width: 0.8 ms
Lead Channel Setting Pacing Amplitude: 2.8 V
Lead Channel Setting Pacing Pulse Width: 0.8 ms
Lead Channel Setting Sensing Sensitivity: 0.6 mV
Pulse Gen Serial Number: 126350

## 2023-04-23 NOTE — Progress Notes (Signed)
 His labs show he has erythrocytosis (elevated hemoglobin) which is due to his smoking. He needs to quit smoking. His TG level remains high but he is already on meds. His other labs look good.  Unable to reach patient, Voicemail box has not been set up   Letter sent 04/23/23

## 2023-04-26 NOTE — Progress Notes (Signed)
 His labs show he has erythrocytosis (elevated hemoglobin) which is due to his smoking. He needs to quit smoking. His TG level remains high but he is already on meds. His other labs look good.  Patient called. Aware of labs

## 2023-04-29 ENCOUNTER — Other Ambulatory Visit: Payer: Self-pay | Admitting: Internal Medicine

## 2023-04-29 DIAGNOSIS — J441 Chronic obstructive pulmonary disease with (acute) exacerbation: Secondary | ICD-10-CM

## 2023-05-03 ENCOUNTER — Other Ambulatory Visit: Payer: Self-pay

## 2023-05-03 DIAGNOSIS — R911 Solitary pulmonary nodule: Secondary | ICD-10-CM

## 2023-05-27 NOTE — Progress Notes (Signed)
 Remote ICD transmission.

## 2023-05-31 ENCOUNTER — Encounter (HOSPITAL_BASED_OUTPATIENT_CLINIC_OR_DEPARTMENT_OTHER): Payer: Self-pay | Admitting: Pulmonary Disease

## 2023-05-31 ENCOUNTER — Ambulatory Visit (HOSPITAL_BASED_OUTPATIENT_CLINIC_OR_DEPARTMENT_OTHER): Payer: 59 | Admitting: Pulmonary Disease

## 2023-05-31 ENCOUNTER — Encounter (HOSPITAL_BASED_OUTPATIENT_CLINIC_OR_DEPARTMENT_OTHER)

## 2023-05-31 VITALS — BP 124/82 | HR 66 | Temp 98.5°F | Resp 16 | Ht 68.0 in | Wt 178.0 lb

## 2023-05-31 DIAGNOSIS — F17210 Nicotine dependence, cigarettes, uncomplicated: Secondary | ICD-10-CM

## 2023-05-31 DIAGNOSIS — J439 Emphysema, unspecified: Secondary | ICD-10-CM

## 2023-05-31 DIAGNOSIS — R911 Solitary pulmonary nodule: Secondary | ICD-10-CM

## 2023-05-31 DIAGNOSIS — J449 Chronic obstructive pulmonary disease, unspecified: Secondary | ICD-10-CM | POA: Diagnosis not present

## 2023-05-31 LAB — PULMONARY FUNCTION TEST
FEF 25-75 Pre: 0.59 L/s
FEF2575-%Pred-Pre: 21 %
FEV1-%Pred-Pre: 38 %
FEV1-Pre: 1.3 L
FEV1FVC-%Pred-Pre: 71 %
FEV6-%Pred-Pre: 56 %
FEV6-Pre: 2.38 L
FEV6FVC-%Pred-Pre: 103 %
FVC-%Pred-Pre: 54 %
FVC-Pre: 2.42 L
Pre FEV1/FVC ratio: 54 %
Pre FEV6/FVC Ratio: 98 %

## 2023-05-31 MED ORDER — TRELEGY ELLIPTA 100-62.5-25 MCG/ACT IN AEPB: 1.0000 | INHALATION_SPRAY | Freq: Every day | RESPIRATORY_TRACT | Status: DC

## 2023-05-31 NOTE — Progress Notes (Signed)
 Subjective:    Patient ID: Justin Ray, male    DOB: 21-Dec-1965, 58 y.o.   MRN: [id number]  HPI Chief Complaint  Patient presents with   Shortness of Breath    Patient referred by Ervin Heath PA for sob x 1 year; cough with clear,thick mucus and frequent wheezing.    58 year old male with COPD who presents with shortness of breath. He was referred by cardiology for evaluation of COPD.  He experiences significant dyspnea with any physical activity, including walking short distances. He can walk about 200 feet before needing to stop and catch his breath. This has impacted his daily activities and led to his retirement. He uses Combivent  as an inhaler but is unsure of its effectiveness and sometimes forgets to use it regularly.  He has a history of smoking about a pack a day and is attempting to quit. In September, a CT scan revealed two lung nodules after a rib fracture. A follow-up CT in March showed the nodules were stable, with the larger nodule approximately 14 mm on the right side and the smaller one 7-8 mm.  He has a history of heart issues, including an ICD placement, and is on his second device. His heart condition was discovered when he initially presented with breathing problems, attributed to a weak and enlarged heart. No recent hospitalizations for breathing issues. Occasional wheezing is noted, but no frequent colds in the past year. An episode of oxygen saturation dropping to 62% improved with deep breathing exercises.  PMH : NICM s/p AICD 2006, OSA, HTN, HLD, and DM II on insulin .    Significant tests/ events reviewed  Spirometry 05/2023 shows severe obstruction with FEV1 38%  sleep study 6/ 2017  upper airway resistance syndrome with AHI of 2.6/h.  O2 saturation nadir 85%   03/2023 CT chest wo con >>  stable nodules  09/2022 CT chest wo con >>  RUL 17 x 14 mmGGO RLL 8mm     Past Medical History:  Diagnosis Date   AICD (automatic cardioverter/defibrillator) present     Anxiety    Basal cell carcinoma    Carpal tunnel syndrome 10/2012   right-GSC   Cataract    Chronic systolic (congestive) heart failure (HCC)    COPD (chronic obstructive pulmonary disease) (HCC)    Depression    Diabetic retinopathy associated with type 2 diabetes mellitus (HCC)    Hyperlipidemia    Hypertension    ICD (implantable cardioverter-defibrillator) in place    Non-ischemic cardiomyopathy (HCC)    OSA (obstructive sleep apnea)    Osteoporosis    Tobacco abuse    Ventricular tachycardia (HCC)     Past Surgical History:  Procedure Laterality Date   ANTERIOR CERVICAL DECOMP/DISCECTOMY FUSION N/A 03/06/2017   Procedure: ANTERIOR CERVICAL DECOMPRESSION FUSION,CERVICAL 6-7 WITH INSTRUMENTATION AND ALLOGRAFT REQUESTED TIME 2.5 HRS;  Surgeon: Virl Grimes, MD;  Location: [facility] OR;  Service: Orthopedics;  Laterality: N/A;  ANTERIOR CERVICAL DECOMPRESSION FUSION, CERVICAL 6-7 WITH INSTRUMENTATION AND ALLOGRAFT REQUESTED TIME 2.5 HRS   CARDIAC DEFIBRILLATOR PLACEMENT  02/01/2004   CARDIAC DEFIBRILLATOR PLACEMENT  11/14   replaced with new one   CARPAL TUNNEL RELEASE Left 12/19/2012   Procedure: LEFT CARPAL TUNNEL RELEASE;  Surgeon: Ronn Cohn, MD;  Location: [facility];  Service: Orthopedics;  Laterality: Left;   CARPAL TUNNEL RELEASE Right 02/1999   EYE SURGERY Bilateral    cataract surgery with lens implants   IMPLANTABLE CARDIOVERTER DEFIBRILLATOR (ICD) GENERATOR CHANGE N/A  11/21/2012   Procedure: ICD GENERATOR CHANGE;  Surgeon: Tammie Fall, MD;  Location: [facility] CATH LAB;  Service: Cardiovascular;  Laterality: N/A;   SHOULDER ARTHROSCOPY Left 07/03/2018   Procedure: ARTHROSCOPY SHOULDER;  Surgeon: Sammye Cristal, MD;  Location: [facility];  Service: Orthopedics;  Laterality: Left;   SHOULDER ARTHROSCOPY WITH SUBACROMIAL DECOMPRESSION AND BICEP TENDON REPAIR Left 07/03/2018   Procedure: SHOULDER ARTHROSCOPY WITH SUBACROMIAL DECOMPRESSION  BICEP  TENODESIS;  Surgeon: Sammye Cristal, MD;  Location: [facility];  Service: Orthopedics;  Laterality: Left;   TONSILLECTOMY      Allergies  Allergen Reactions   Codeine Shortness Of Breath and Other (See Comments)    Severe HA's.   Niaspan [Niacin Er (Antihyperlipidemic)] Other (See Comments)    Per pt feels like skin is burning( REDNESS), and pins/needles are sticking    Sulfonamide Derivatives Other (See Comments)    thrush    Social History   Socioeconomic History   Marital status: Widowed    Spouse name: Not on file   Number of children: 0   Years of education: Not on file   Highest education level: Not on file  Occupational History   Occupation: maintenance    Employer: UNEMPLOYED  Tobacco Use   Smoking status: Every Day    Current packs/day: 1.50    Types: Cigarettes   Smokeless tobacco: Never   Tobacco comments:    Smokes 1-1.5 PPD  Vaping Use   Vaping status: Never Used  Substance and Sexual Activity   Alcohol use: Yes    Comment: A SIX PACK EVERY SIX MONTHS    Drug use: No   Sexual activity: Never    Comment: number of sex partners in the last 12 months  0  Other Topics Concern   Not on file  Social History Narrative   Not on file   Social Drivers of Health   Financial Resource Strain: Not on file  Food Insecurity: Not on file  Transportation Needs: Not on file  Physical Activity: Not on file  Stress: Not on file  Social Connections: Not on file  Intimate Partner Violence: Not on file   Family History  Problem Relation Age of Onset   Coronary artery disease Father        CABG   Cancer Mother        colon   Heart disease Brother    Cancer Other      Review of Systems Constitutional: negative for anorexia, fevers and sweats  Eyes: negative for irritation, redness and visual disturbance  Ears, nose, mouth, throat, and face: negative for earaches, epistaxis, nasal congestion and sore throat  Respiratory: positive for cough,  dyspnea on exertion, sputum and wheezing  Cardiovascular: negative for chest pain, dyspnea, lower extremity edema, orthopnea, palpitations and syncope  Gastrointestinal: negative for abdominal pain, constipation, diarrhea, melena, nausea and vomiting  Genitourinary:negative for dysuria, frequency and hematuria  Hematologic/lymphatic: negative for bleeding, easy bruising and lymphadenopathy  Musculoskeletal:negative for arthralgias, muscle weakness and stiff joints  Neurological: negative for coordination problems, gait problems, headaches and weakness  Endocrine: negative for diabetic symptoms including polydipsia, polyuria and weight loss     Objective:   Physical Exam  Gen. Pleasant, well-nourished, in no distress, normal affect ENT - no pallor,icterus, no post nasal drip Neck: No JVD, no thyromegaly, no carotid bruits Lungs: no use of accessory muscles, no dullness to percussion, decreased BL without rales or rhonchi  Cardiovascular: Rhythm regular, heart sounds  normal, no murmurs or gallops, no peripheral edema Abdomen: soft and non-tender, no hepatosplenomegaly, BS normal. Musculoskeletal: No deformities, no cyanosis or clubbing Neuro:  alert, non focal       Assessment & Plan:   Chronic Obstructive Pulmonary Disease (COPD) Chronic dyspnea on exertion, limiting daily activities. Smokes one pack per day. Current Combivent  inhaler provides limited relief. Further evaluation needed to determine COPD severity and management. Spirometry will guide maintenance inhaler decision. - Order spirometry to assess COPD severity. - Advise Combivent  inhaler use up to three or four times daily as needed, especially pre-activity. - Consider maintenance inhaler prescription based on spirometry results. - Provide maintenance inhaler Trelegy sample   Lung nodules Two lung nodules identified on CT, measuring 14 mm on the right and 7-8 mm. Both nodules stable over six months. Continued monitoring  necessary due to smoking history to rule out malignancy over time, potentially up to five years. - Schedule follow-up CT chest in six months to monitor lung nodules.  Skin cancer Excision from the ear. Recent cervical lymphadenopathy, now reduced. No current evidence of malignancy. Swelling may be reactive or related to recent excision.  TOb abuse  - cessation emphasized

## 2023-05-31 NOTE — Patient Instructions (Signed)
Spirometry performed today. 

## 2023-05-31 NOTE — Progress Notes (Unsigned)
Spirometry only performed today.

## 2023-05-31 NOTE — Patient Instructions (Signed)
 X spirometry today  Sample of Trelegy 100 - 1 puff daily, rinse mouth after use  X CT chest wo con in sep 2025

## 2023-06-12 ENCOUNTER — Telehealth: Payer: Self-pay | Admitting: *Deleted

## 2023-06-12 MED ORDER — TRELEGY ELLIPTA 100-62.5-25 MCG/ACT IN AEPB: 1.0000 | INHALATION_SPRAY | Freq: Every day | RESPIRATORY_TRACT | 11 refills | Status: AC

## 2023-06-12 NOTE — Telephone Encounter (Signed)
 Copied from CRM [id number]. Topic: Clinical - Medication Question >> Jun 12, 2023 10:25 AM [name] wrote: Reason for CRM: Patient seen Dr.Alva and was provided a sample medication of Fluticasone-Umeclidin-Vilant (TRELEGY ELLIPTA ) 100-62.5-25 MCG/ACT AEPB. Patient stated he is almost out of the sample medication for 14 days and he would like another sample or an actual prescription for the medication.  Called and spoke with patient, he was given a Trelegy 100 sample in the office and has almost used the entire sample.  He states he could tell a difference when he first started.  I advised him I would send in a prescription to his pharmacy (verified his pharmacy) as in Dr. Hortense Lyons note stated this would be his maintenance inhaler.  I also advised him per Dr. Villa Greaser, he could use his combivent  inhaler up to 3-4 times daily pre activity.  He stated he had plenty of the combivent  at this time.  I sent him the link for the Trelegy mobile savings as he understood the medication was expensive.  I let him know to let us  know if he needed anything further.  He verbalized understanding.  Nothing further needed.

## 2023-06-22 ENCOUNTER — Other Ambulatory Visit: Payer: Self-pay | Admitting: Internal Medicine

## 2023-07-09 ENCOUNTER — Other Ambulatory Visit: Payer: Self-pay | Admitting: Internal Medicine

## 2023-07-17 ENCOUNTER — Ambulatory Visit: Admitting: Internal Medicine

## 2023-07-17 ENCOUNTER — Encounter: Payer: Self-pay | Admitting: Internal Medicine

## 2023-07-17 VITALS — BP 126/78 | HR 70 | Temp 98.1°F | Resp 17 | Ht 68.0 in | Wt 179.6 lb

## 2023-07-17 DIAGNOSIS — I10 Essential (primary) hypertension: Secondary | ICD-10-CM

## 2023-07-17 DIAGNOSIS — F17210 Nicotine dependence, cigarettes, uncomplicated: Secondary | ICD-10-CM

## 2023-07-17 DIAGNOSIS — J449 Chronic obstructive pulmonary disease, unspecified: Secondary | ICD-10-CM

## 2023-07-17 DIAGNOSIS — E113293 Type 2 diabetes mellitus with mild nonproliferative diabetic retinopathy without macular edema, bilateral: Secondary | ICD-10-CM

## 2023-07-17 MED ORDER — CLONAZEPAM 0.5 MG PO TABS: 0.5000 mg | ORAL_TABLET | Freq: Two times a day (BID) | ORAL | 1 refills | Status: AC | PRN

## 2023-07-17 MED ORDER — LISINOPRIL 20 MG PO TABS: 20.0000 mg | ORAL_TABLET | Freq: Two times a day (BID) | ORAL | 3 refills | Status: AC

## 2023-07-17 MED ORDER — METFORMIN HCL ER 500 MG PO TB24: 1000.0000 mg | ORAL_TABLET | Freq: Two times a day (BID) | ORAL | 3 refills | Status: DC

## 2023-07-17 MED ORDER — OMEGA-3-ACID ETHYL ESTERS 1 G PO CAPS: 2.0000 | ORAL_CAPSULE | Freq: Two times a day (BID) | ORAL | 3 refills | Status: AC

## 2023-07-17 MED ORDER — FENOFIBRATE 160 MG PO TABS: 160.0000 mg | ORAL_TABLET | Freq: Every day | ORAL | 3 refills | Status: AC

## 2023-07-17 NOTE — Progress Notes (Signed)
 Office Visit  Subjective   Patient ID: Derrian Poli   DOB: 1965-10-08   Age: 58 y.o.   MRN: [id number]   Chief Complaint Chief Complaint  Patient presents with   Follow-up     History of Present Illness The patient is a 58 year old Caucasian/White male with past medical history of COPD.  He was diagnosed with COPD in 2006.  This past year he had a COPD exacerbation in 07/2022 which we treated as an outpatient and he recovered.  In 2006, he was having SOB and was sent to the pulmonologist who did spirometry and told him he probably had COPD but his heart was his problem where he has chronic systolic CHF with non-ischemic cardiomyopathy. They placed him on nebulization treatment at that time.  When I saw him 3 months ago, he was using a combivent  inhaler which he was using 3-4 times a day.  He was smoking 1.5- 2 ppd but had cut back down to 1 ppd of cigarettes which he cut back in 01/2023.  He started smoking at age 48. He has tried chantix in the past but this gave him nightmares. He also has tried nicotine  patches in the past.  We noted on his yearly labs in 04/2023 that he had an erythrocytosis with an elevated hemoglobin.  I felt this was related to his cigarette use.  Today, he has cut back again on his cigarette use and has cut back to less than 1/2 ppd.  He also has a history of OSA where he was snoring and not well rested in the morning. He was having frequent awakenings at night. He was sent for a sleep study in 2016 but this was nonconclusive and his cardiologist is thinking about repeating his sleep study.  He has been noncompliant with his CPAP for the past year.  See PMH for summary of pulmonary history and lab reports for status of any previous PFTs: COPD and Tobacco Abuse. Comorbid conditions : active smoker., CHF., and atrial fibrillation. He states he can go a few steps and will get SOB.  He has the following modifiable risk factors: smoking, CHF, sedentary lifestyle, and obesity.  Cardiology did repeat a CT scan of his chest on 03/05/2023 for the previous noted pulmonary nodules from his CT chest in 09/2022.  His recent CT of chest on 03/05/2023 showed no significant change in the ill-defined semi-solid ground-glass appearing nodular parenchymal opacity of the apical segment of the right upper lobe measuring 17 x 14 mm. Lung-RADS 2, benign appearance or behavior. Continue annual screening with low-dose chest CT without contrast in 12 months.  The previously described 8 mm possibly inflammatory nodule in the right lower lobe is similar measuring 7 mm in maximum diameter. May represent some fibrotic post tori changes.  There was extensive coronary artery calcifications.  Cardiology referred him to pulmonary where he established with them on 05/31/2023.  Again, he experiences significant dyspnea with any physical activity, including walking short distances. He can walk about 200 feet before needing to stop and catch his breath. This has impacted his daily activities and led to his retirement.  They did spirometry  at that time in 05/2023 that showed severe obstruction with FEV1 38%.  He discussed smoking cessation with the patient.  The patient was also started on trelegy and he will followup with pulmonary again in 08/2023.  He wants to repeat the CT scan of his chest in 6 months to monitor his lung nodules.  Today, the patient states the trelegy has helped his breathing.  Specifically denied complaints: productive cough, change in color of sputum, pleuritic chest pain, purulent sputum, hemoptysis, and fever.    The patient is a 58 year old Caucasian/White male who returns for a follow-up visit for his T2 diabetes.  This past year, we noted on a spot FSBS that his sugar was 48.  He denied any problems with hypoglycemia episodes at that time but I did cut back his humulin  70/30 from 65 Units in AM to 60 Units in AM and his 55 Units in PM down to 50 Units in the PM.  He denies any hypoglycemia since  his last visit.  He has a G-voke pen but has not had to use this.  This past year, we had him discontinue his victoza  as his A1c has been well controlled.  He is watching his diet and has been walking for exercise.  He remains on metformin  ER 2000mg  daily, jardiance  25mg  daily and humulin  70/30 with 60 Units in AM and 50 Units in PM.  He specifically denies unexplained abdominal pain, nausea, vomiting or diarrhea or hypoglyemia.  He states his FSBS are running between 80-120 where he check his FSBS once a day.   He states he tries to eats 2 meals per day but eats snacks and he is now eating a pack of crackers at night before bed.  His last HgBA1c was done 3 months ago and was 5.8%.  He came in fasting today in anticipation of lab work.  He denies any diabetic neuropathy, nephropathy or cardiovascular disease.  He was noted to have some microalbuminuria in the past.  He did have a dilated diabetic eye exam done on 12/08/2022 with Dr. Sharalyn at [facility]  where he has mild diabetic retinopathy in both eyes that is improved per their notes.   The patient is a 58 year old Caucasian/White male who presents for a follow-up evaluation of hypertension.    The patient has been checking his blood pressure at home. The patient's blood pressure has ranged systollically in the 140's. The patient's current medications include: carvedilol  25mg  BID, hydrochlorothiazide  25mg  po every other day and lisinopril  20mg  BID. The patient has been tolerating his medications well. The patient denies any headache, visual changes, dizziness, lightheadness, chest pain, shortness of breath, weakness/numbness, and edema.  He reports there have been no other symptoms noted.  We stopped his HCTZ last year due to his dizziness with orthostatic hypotension.  He is now back on hydrochlorothiazide .      Past Medical History Past Medical History:  Diagnosis Date   AICD (automatic cardioverter/defibrillator) present    Anxiety    Basal  cell carcinoma    Carpal tunnel syndrome 10/2012   right-GSC   Cataract    Chronic systolic (congestive) heart failure (HCC)    COPD (chronic obstructive pulmonary disease) (HCC)    Depression    Diabetic retinopathy associated with type 2 diabetes mellitus (HCC)    Hyperlipidemia    Hypertension    ICD (implantable cardioverter-defibrillator) in place    Non-ischemic cardiomyopathy (HCC)    OSA (obstructive sleep apnea)    Osteoporosis    Tobacco abuse    Ventricular tachycardia (HCC)      Allergies Allergies  Allergen Reactions   Codeine Shortness Of Breath and Other (See Comments)    Severe HA's.   Niaspan [Niacin Er (Antihyperlipidemic)] Other (See Comments)    Per pt feels like skin  is burning( REDNESS), and pins/needles are sticking    Sulfonamide Derivatives Other (See Comments)    thrush     Medications  Current Outpatient Medications:    acetaminophen  (TYLENOL ) 500 MG tablet, Take 2 tablets (1,000 mg total) by mouth every 6 (six) hours as needed for up to 30 doses., Disp: 30 tablet, Rfl: 0   alendronate  (FOSAMAX ) 70 MG tablet, Take 1 tablet (70 mg total) by mouth every 7 (seven) days. Take with a full glass of water on an empty stomach., Disp: 4 tablet, Rfl: 11   ARIPiprazole  (ABILIFY ) 2 MG tablet, TAKE 1 TABLET(2 MG) BY MOUTH DAILY, Disp: 30 tablet, Rfl: 1   aspirin  81 MG tablet, Take 81 mg by mouth daily. , Disp: , Rfl:    BD INSULIN  SYRINGE U/F 31G X 5/16 1 ML MISC, AS DIRECTED TWICE DAILY, Disp: 100 each, Rfl: 3   BD PEN NEEDLE NANO 2ND GEN 32G X 4 MM MISC, See admin instructions., Disp: , Rfl:    blood glucose meter kit and supplies KIT, Test blood sugar 3 times daily. Dx code: E11.65, Disp: 1 each, Rfl: 0   carvedilol  (COREG ) 25 MG tablet, TAKE 1 TABLET(25 MG) BY MOUTH TWICE DAILY WITH FOOD, Disp: 180 tablet, Rfl: 1   clonazePAM  (KLONOPIN ) 0.5 MG tablet, Take 1 tablet (0.5 mg total) by mouth 2 (two) times daily as needed., Disp: 30 tablet, Rfl: 0   COMBIVENT   RESPIMAT 20-100 MCG/ACT AERS respimat, INHALE 1 PUFF INTO THE LUNGS EVERY 6 HOURS, Disp: 4 g, Rfl: 1   desvenlafaxine  (PRISTIQ ) 100 MG 24 hr tablet, TAKE 1 TABLET BY MOUTH EVERY DAY, Disp: 30 tablet, Rfl: 5   fenofibrate  160 MG tablet, TAKE 1 TABLET(160 MG) BY MOUTH EVERY DAY, Disp: 90 tablet, Rfl: 1   Fluticasone-Umeclidin-Vilant (TRELEGY ELLIPTA ) 100-62.5-25 MCG/ACT AEPB, Inhale 1 puff into the lungs daily., Disp: 60 each, Rfl: 11   glucose blood test strip, Check FSBS 3-4 times per day, Disp: 100 each, Rfl: 12   HUMULIN  70/30 (70-30) 100 UNIT/ML injection, INJECT 60 UNITS UNDER THE SKIN IN THE AM AND 50 UNITS AT NIGHT, Disp: 40 mL, Rfl: 1   hydrochlorothiazide  (HYDRODIURIL ) 25 MG tablet, TAKE 1 TABLET(25 MG) BY MOUTH EVERY OTHER DAY, Disp: 45 tablet, Rfl: 3   JARDIANCE  25 MG TABS tablet, TAKE 1 TABLET(25 MG) BY MOUTH DAILY BEFORE BREAKFAST, Disp: 90 tablet, Rfl: 1   lisinopril  (ZESTRIL ) 20 MG tablet, TAKE 1 TABLET(20 MG) BY MOUTH TWICE DAILY, Disp: 180 tablet, Rfl: 3   metFORMIN  (GLUCOPHAGE -XR) 500 MG 24 hr tablet, TAKE 2 TABLETS BY MOUTH TWICE DAILY, Disp: 360 tablet, Rfl: 1   omega-3 acid ethyl esters (LOVAZA ) 1 g capsule, TAKE 2 CAPSULES BY MOUTH TWICE DAILY, Disp: 360 capsule, Rfl: 1   Review of Systems Review of Systems  Constitutional:  Negative for chills, fever and malaise/fatigue.  Eyes:  Negative for blurred vision and double vision.  Respiratory:  Negative for cough, hemoptysis, shortness of breath and wheezing.   Cardiovascular:  Negative for chest pain, palpitations and leg swelling.  Gastrointestinal:  Negative for abdominal pain, constipation, diarrhea, heartburn, nausea and vomiting.  Genitourinary:  Negative for frequency.  Musculoskeletal:  Negative for myalgias.  Skin:  Negative for itching and rash.  Neurological:  Negative for dizziness, weakness and headaches.  Endo/Heme/Allergies:  Negative for polydipsia.       Objective:    Vitals BP 126/78   Pulse 70    Temp 98.1 F (36.7 C)  Resp 17   Ht 5' 8 (1.727 m)   Wt 179 lb 9.6 oz (81.5 kg)   SpO2 93%   BMI 27.31 kg/m    Physical Examination Physical Exam Constitutional:      Appearance: Normal appearance. He is not ill-appearing.  Cardiovascular:     Rate and Rhythm: Normal rate and regular rhythm.     Pulses: Normal pulses.     Heart sounds: No murmur heard.    No friction rub. No gallop.  Pulmonary:     Effort: Pulmonary effort is normal. No respiratory distress.     Breath sounds: No wheezing, rhonchi or rales.  Abdominal:     General: Abdomen is flat. Bowel sounds are normal. There is no distension.     Palpations: Abdomen is soft.     Tenderness: There is no abdominal tenderness.  Musculoskeletal:     Right lower leg: No edema.     Left lower leg: No edema.  Skin:    General: Skin is warm and dry.     Findings: No rash.  Neurological:     General: No focal deficit present.     Mental Status: He is alert and oriented to person, place, and time.  Psychiatric:        Mood and Affect: Mood normal.        Behavior: Behavior normal.        Assessment & Plan:   Essential hypertension His BP is well controlled.  We will continue his current medications.  Chronic obstructive pulmonary disease (HCC) I have reviewed pulmonary's note.  They are going to repeat another CT scan in about 4 months to monitor his pulmonary nodules.  They started him on trelegy for his COPD which seems to be helping.  He can use his combivent  as needed.  Cigarette smoker The patient is actively trying to quit.  He has cut back from 1 ppd to less than 1/2 ppd with goal to quit.  I have congratulated him.  Mild nonproliferative diabetic retinopathy of both eyes without macular edema associated with type 2 diabetes mellitus (HCC) We will recheck his HgBa1c on his next visit as his sugars have been controlled.  He denies any hypoglycemia.  He will need another eye exam later in the year.    Return  in about 3 months (around 10/17/2023).   Selinda Fleeta Finger, MD

## 2023-07-17 NOTE — Assessment & Plan Note (Signed)
His BP is well controlled.  We will continue his current medications. 

## 2023-07-17 NOTE — Assessment & Plan Note (Signed)
 I have reviewed pulmonary's note.  They are going to repeat another CT scan in about 4 months to monitor his pulmonary nodules.  They started him on trelegy for his COPD which seems to be helping.  He can use his combivent  as needed.

## 2023-07-17 NOTE — Assessment & Plan Note (Signed)
 We will recheck his HgBa1c on his next visit as his sugars have been controlled.  He denies any hypoglycemia.  He will need another eye exam later in the year.

## 2023-07-17 NOTE — Assessment & Plan Note (Signed)
 The patient is actively trying to quit.  He has cut back from 1 ppd to less than 1/2 ppd with goal to quit.  I have congratulated him.

## 2023-07-22 ENCOUNTER — Ambulatory Visit (INDEPENDENT_AMBULATORY_CARE_PROVIDER_SITE_OTHER): Payer: 59

## 2023-07-22 DIAGNOSIS — I428 Other cardiomyopathies: Secondary | ICD-10-CM | POA: Diagnosis not present

## 2023-07-24 LAB — CUP PACEART REMOTE DEVICE CHECK
Battery Remaining Longevity: 24 mo
Battery Remaining Percentage: 26 %
Brady Statistic RV Percent Paced: 0 %
Date Time Interrogation Session: 20250708134400
HighPow Impedance: 76 Ohm
Implantable Lead Connection Status: 753985
Implantable Lead Implant Date: 20060117
Implantable Lead Location: 753860
Implantable Lead Model: 185
Implantable Lead Serial Number: 124785
Implantable Pulse Generator Implant Date: 20141107
Lead Channel Impedance Value: 561 Ohm
Lead Channel Pacing Threshold Amplitude: 1.8 V
Lead Channel Pacing Threshold Pulse Width: 0.8 ms
Lead Channel Setting Pacing Amplitude: 2.8 V
Lead Channel Setting Pacing Pulse Width: 0.8 ms
Lead Channel Setting Sensing Sensitivity: 0.6 mV
Pulse Gen Serial Number: 126350

## 2023-07-25 ENCOUNTER — Ambulatory Visit: Payer: Self-pay | Admitting: Internal Medicine

## 2023-08-13 ENCOUNTER — Other Ambulatory Visit: Payer: Self-pay | Admitting: Internal Medicine

## 2023-08-22 ENCOUNTER — Other Ambulatory Visit: Payer: Self-pay | Admitting: Internal Medicine

## 2023-08-26 ENCOUNTER — Encounter (HOSPITAL_BASED_OUTPATIENT_CLINIC_OR_DEPARTMENT_OTHER): Payer: Self-pay | Admitting: Adult Health

## 2023-08-26 ENCOUNTER — Ambulatory Visit (HOSPITAL_BASED_OUTPATIENT_CLINIC_OR_DEPARTMENT_OTHER): Admitting: Adult Health

## 2023-08-26 VITALS — BP 150/79 | HR 70 | Ht 68.0 in | Wt 179.0 lb

## 2023-08-26 DIAGNOSIS — J449 Chronic obstructive pulmonary disease, unspecified: Secondary | ICD-10-CM | POA: Diagnosis not present

## 2023-08-26 DIAGNOSIS — R911 Solitary pulmonary nodule: Secondary | ICD-10-CM

## 2023-08-26 DIAGNOSIS — J4489 Other specified chronic obstructive pulmonary disease: Secondary | ICD-10-CM

## 2023-08-26 MED ORDER — ALBUTEROL SULFATE HFA 108 (90 BASE) MCG/ACT IN AERS: 2.0000 | INHALATION_SPRAY | Freq: Four times a day (QID) | RESPIRATORY_TRACT | 3 refills | Status: DC | PRN

## 2023-08-26 NOTE — Progress Notes (Signed)
 @Patient  ID: Justin Ray, male    DOB: 07-11-1965, 58 y.o.   MRN: [id number]  Chief Complaint  Patient presents with   Follow-up    Referring provider: Fleeta Valeria Mayo, MD  HPI: 58 year old male active smoker seen for pulmonary consult May 31, 2023 for suspected COPD  TEST/EVENTS :   08/26/2023  Discussed the use of AI scribe software for clinical note transcription with the patient, who gave verbal consent to proceed.  History of Present Illness Justin Ray is a 58 year old male smoker with suspected COPD who presents for follow-up of his respiratory condition.  Patient had recent pulmonary function test that showed severe COPD.  PFTs on May 31, 2023 showed FEV1 at 38%, ratio 54, FVC 54% He experiences significant dyspnea, particularly when ambulating, describing it as 'breathing through a straw'. 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 He often experiences dyspnea even at rest, such as when watching television.  He has a history of heavy smoking but has abstained for the past two days. He is currently using a Trelegy inhaler, which he reports has been helpful. He has a history of heart issues and has a defibrillator. . He has received routine vaccinations, including pneumonia and Tdap, and maintains regular follow-ups with his family doctor.  No current use of supplemental oxygen.   Previous CT chest showed right upper lobe lung nodule.  Has a planned follow-up CT chest in September  Allergies  Allergen Reactions   Codeine Shortness Of Breath and Other (See Comments)    Severe HA's.   Niaspan [Niacin Er (Antihyperlipidemic)] Other (See Comments)    Per pt feels like skin is burning( REDNESS), and pins/needles are sticking    Sulfonamide Derivatives Other (See Comments)    thrush    Immunization History  Administered Date(s) Administered   Influenza, Mdck, Trivalent,PF 6+  MOS(egg free) 10/19/2022   Influenza,inj,Quad PF,6+ Mos 11/26/2012, 09/02/2013, 09/29/2014   Influenza-Unspecified 11/16/2015, 11/15/2017   Pneumococcal Polysaccharide-23 03/04/2013   Tdap 03/04/2013   Zoster Recombinant(Shingrix) 12/30/2016    Past Medical History:  Diagnosis Date   AICD (automatic cardioverter/defibrillator) present    Anxiety    Basal cell carcinoma    Carpal tunnel syndrome 10/2012   right-GSC   Cataract    Chronic systolic (congestive) heart failure (HCC)    COPD (chronic obstructive pulmonary disease) (HCC)    Depression    Diabetic retinopathy associated with type 2 diabetes mellitus (HCC)    Hyperlipidemia    Hypertension    ICD (implantable cardioverter-defibrillator) in place    Non-ischemic cardiomyopathy (HCC)    OSA (obstructive sleep apnea)    Osteoporosis    Tobacco abuse    Ventricular tachycardia (HCC)     Tobacco History: Social History   Tobacco Use  Smoking Status Every Day   Current packs/day: 1.50   Types: Cigarettes  Smokeless Tobacco Never  Tobacco Comments   Smokes 1-1.5 PPD   Ready to quit: Not Answered Counseling given: Not Answered Tobacco comments: Smokes 1-1.5 PPD   Outpatient Medications Prior to Visit  Medication Sig Dispense Refill   acetaminophen  (TYLENOL ) 500 MG tablet Take 2 tablets (1,000 mg total) by mouth every 6 (six) hours as needed for up to 30 doses. 30 tablet 0   alendronate  (FOSAMAX ) 70 MG tablet Take 1 tablet (70 mg total) by mouth every 7 (seven) days. Take with a full glass  of water on an empty stomach. 4 tablet 11   ARIPiprazole  (ABILIFY ) 2 MG tablet TAKE 1 TABLET(2 MG) BY MOUTH DAILY 30 tablet 1   aspirin  81 MG tablet Take 81 mg by mouth daily.      BD INSULIN  SYRINGE U/F 31G X 5/16 1 ML MISC AS DIRECTED TWICE DAILY 100 each 3   blood glucose meter kit and supplies KIT Test blood sugar 3 times daily. Dx code: E11.65 1 each 0   carvedilol  (COREG ) 25 MG tablet TAKE 1 TABLET(25 MG) BY MOUTH TWICE  DAILY WITH FOOD 180 tablet 1   clonazePAM  (KLONOPIN ) 0.5 MG tablet Take 1 tablet (0.5 mg total) by mouth 2 (two) times daily as needed. 30 tablet 1   COMBIVENT  RESPIMAT 20-100 MCG/ACT AERS respimat INHALE 1 PUFF INTO THE LUNGS EVERY 6 HOURS 4 g 1   desvenlafaxine  (PRISTIQ ) 100 MG 24 hr tablet TAKE 1 TABLET BY MOUTH EVERY DAY 30 tablet 5   fenofibrate  160 MG tablet Take 1 tablet (160 mg total) by mouth daily. 90 tablet 3   Fluticasone-Umeclidin-Vilant (TRELEGY ELLIPTA ) 100-62.5-25 MCG/ACT AEPB Inhale 1 puff into the lungs daily. 60 each 11   glucose blood test strip Check FSBS 3-4 times per day 100 each 12   HUMULIN  70/30 (70-30) 100 UNIT/ML injection INJECT 60 UNITS UNDER THE SKIN IN THE AM AND 50 UNITS AT NIGHT 40 mL 1   hydrochlorothiazide  (HYDRODIURIL ) 25 MG tablet TAKE 1 TABLET(25 MG) BY MOUTH EVERY OTHER DAY 45 tablet 3   JARDIANCE  25 MG TABS tablet TAKE 1 TABLET(25 MG) BY MOUTH DAILY BEFORE BREAKFAST 90 tablet 1   lisinopril  (ZESTRIL ) 20 MG tablet Take 1 tablet (20 mg total) by mouth in the morning and at bedtime. 180 tablet 3   metFORMIN  (GLUCOPHAGE -XR) 500 MG 24 hr tablet Take 2 tablets (1,000 mg total) by mouth 2 (two) times daily. 360 tablet 3   omega-3 acid ethyl esters (LOVAZA ) 1 g capsule Take 2 capsules (2 g total) by mouth 2 (two) times daily. 360 capsule 3   BD PEN NEEDLE NANO 2ND GEN 32G X 4 MM MISC See admin instructions.     No facility-administered medications prior to visit.     Review of Systems:   Constitutional:   No  weight loss, night sweats,  Fevers, chills, +fatigue, or  lassitude.  HEENT:   No headaches,  Difficulty swallowing,  Tooth/dental problems, or  Sore throat,                No sneezing, itching, ear ache, nasal congestion, post nasal drip,   CV:  No chest pain,  Orthopnea, PND, swelling in lower extremities, anasarca, dizziness, palpitations, syncope.   GI  No heartburn, indigestion, abdominal pain, nausea, vomiting, diarrhea, change in bowel habits,  loss of appetite, bloody stools.   Resp:   No wheezing.  No chest wall deformity  Skin: no rash or lesions.  GU: no dysuria, change in color of urine, no urgency or frequency.  No flank pain, no hematuria   MS:  No joint pain or swelling.  No decreased range of motion.  No back pain.    Physical Exam  BP (!) 150/79 (BP Location: Left Arm, Patient Position: Sitting, Cuff Size: Normal)   Pulse 70   Ht 5' 8 (1.727 m)   Wt 179 lb (81.2 kg)   SpO2 96%   BMI 27.22 kg/m   GEN: A/Ox3; pleasant , NAD, well nourished    HEENT:  [place]/AT,  , NOSE-clear, THROAT-clear, no lesions, no postnasal drip or exudate noted.   NECK:  Supple w/ fair ROM; no JVD; normal carotid impulses w/o bruits; no thyromegaly or nodules palpated; no lymphadenopathy.    RESP  Clear  P & A; w/o, wheezes/ rales/ or rhonchi. no accessory muscle use, no dullness to percussion  CARD:  RRR, no m/r/g, no peripheral edema, pulses intact, no cyanosis or clubbing.  GI:   Soft & nt; nml bowel sounds; no organomegaly or masses detected.   Musco: Warm bil, no deformities or joint swelling noted.   Neuro: alert, no focal deficits noted.    Skin: Warm, no lesions or rashes    Lab Results:  CBC  ProBNP No results found for: PROBNP  Imaging: No results found.  Administration History     None          Latest Ref Rng & Units 05/31/2023   10:00 AM  PFT Results  FVC-Pre L 2.42   FVC-Predicted Pre % 54   Pre FEV1/FVC % % 54   FEV1-Pre L 1.30   FEV1-Predicted Pre % 38     No results found for: NITRICOXIDE      Assessment & Plan:   No problem-specific Assessment & Plan notes found for this encounter.  Assessment and Plan Assessment & Plan Severe chronic obstructive pulmonary disease (COPD)   He has severe COPD with FEV1 at 38%, indicating stage 3 severity. Airways are very obstructed with significant inflammation and constriction, contributing to dyspnea. Currently using Trelegy inhaler, which  contains three medications to reduce inflammation and open airways. Combivent  was previously used but contains overlapping medication with Trelegy. Oxygen saturation is 96%, indicating adequate oxygenation at rest. Continue Trelegy inhaler as prescribed. Discontinue Combivent  and switch to albuterol  inhaler for rescue use as needed. Order follow-up breathing test in six months to assess lung function after smoking cessation. Ensure proper rinsing after using Trelegy to prevent thrush. Encourage continued smoking cessation efforts.  Nicotine  dependence, recently quit smoking   He recently quit smoking for two days after being a heavy smoker. Smoking cessation is crucial for improving COPD symptoms and overall lung function. Experiencing increased appetite as a side effect of quitting smoking. Encourage continued smoking cessation.   Lung nodule-follow-up CT chest in September as planned  Cardiomyopathy- continue follow up with Cardiology.   Plan  Patient Instructions  Continue on Trelegy inhaler 1 puff daily  Change Combivent  to Albuterol  inhaler As needed   CT chest as planned this Fall.  Activity as tolerated.  Follow up with Dr. Jude  in 4 months and As needed         Madelin Stank, NP 08/26/2023

## 2023-08-26 NOTE — Patient Instructions (Addendum)
 Continue on Trelegy inhaler 1 puff daily  Change Combivent  to Albuterol  inhaler As needed   CT chest as planned this Fall.  Activity as tolerated.  Follow up with Dr. Jude  in 4 months and As needed

## 2023-09-04 ENCOUNTER — Other Ambulatory Visit: Payer: Self-pay | Admitting: Internal Medicine

## 2023-09-06 ENCOUNTER — Other Ambulatory Visit: Payer: Self-pay | Admitting: Internal Medicine

## 2023-09-10 NOTE — Progress Notes (Unsigned)
  Electrophysiology Office Note:   Date:  09/11/2023  ID:  Justin Ray, DOB September 01, 1965, MRN [id number]  Primary Cardiologist: Debby Sor, MD (Inactive) Primary Heart Failure: None Electrophysiologist: Danelle Birmingham, MD       History of Present Illness:   Justin Ray is a 58 y.o. male with h/o AF, HFrEF due to NICM s/p ICD, HTN, HLD, COPD, OSA, obesity and anxiety seen today for routine electrophysiology followup.   Patient due for normal EP follow up and assessment of Gore ICD lead under [employer].   Since last being seen in our clinic the patient reports doing well over all.  He has stopped smoking. No concerns with his device.  Asks if we are getting home monitoring reports.   He denies chest pain, palpitations, dyspnea, PND, orthopnea, nausea, vomiting, dizziness, syncope, edema, weight gain, or early satiety.   Review of systems complete and found to be negative unless listed in HPI.   EP Information / Studies Reviewed:    EKG is not ordered today. EKG from 03/05/23 reviewed which showed NSR 67 bpm       ICD Interrogation-  reviewed in detail today,  See PACEART report.  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 DFT's were carried out at implant (14j failed, 21j terminated VF) Generator Change > 12/01/12  History of appropriate therapy: No, DFT's only found on record dating back to 2012 History of AAD therapy: No   Risk Assessment/Calculations:              Physical Exam:   VS:  BP 120/76   Pulse 67   Ht 5' 8 (1.727 m)   Wt 179 lb 12.8 oz (81.6 kg)   SpO2 95%   BMI 27.34 kg/m    Wt Readings from Last 3 Encounters:  09/11/23 179 lb 12.8 oz (81.6 kg)  08/26/23 179 lb (81.2 kg)  07/17/23 179 lb 9.6 oz (81.5 kg)     GEN: Well nourished, well developed in no acute distress NECK: No JVD; No carotid bruits CARDIAC: Regular rate and rhythm, no murmurs, rubs, gallops RESPIRATORY:  Clear to auscultation without rales,  wheezing or rhonchi  ABDOMEN: Soft, non-tender, non-distended EXTREMITIES:  No edema; No deformity   ASSESSMENT AND PLAN:    Chronic Systolic Dysfunction s/p [employer] single chamber ICD  -euvolemic on exam   -Stable on an appropriate medical regimen -Normal ICD function -See Pace Art report -Gore Lead > RV lead impedance 74 ohms -Has Gore lead, stable RV impedance - based on industry recommendations, initial shock in VT-1 zone increased to 41j. Reversed polarity (not changed) - is programmed RV coil to RA coil & can. No treated arrhythmias. Two very brief episodes binned as NSVT, irregular, <8 sec on two events with EGM's.  Longest episode noted to be 37 sec. -pt denies ever being shocked, can not find evidence of shock in chart but his device pre-dates our current charting system  NSVT vs Atrial Arrhythmia  -assess short term monitor  -episodes binned as NSVT with some irregularity (cycle length 328-433 ms) -pending monitor, may need to bring back sooner than annual check  Hypertension  -well controlled on current regimen    Disposition:   Follow up with Dr. Birmingham in 12 months   Signed, Daphne Barrack, NP-C, AGACNP-BC [facility] - Electrophysiology  09/11/2023, 12:30 PM

## 2023-09-11 ENCOUNTER — Ambulatory Visit: Attending: Pulmonary Disease | Admitting: Pulmonary Disease

## 2023-09-11 ENCOUNTER — Ambulatory Visit (INDEPENDENT_AMBULATORY_CARE_PROVIDER_SITE_OTHER)

## 2023-09-11 ENCOUNTER — Encounter: Payer: Self-pay | Admitting: Pulmonary Disease

## 2023-09-11 VITALS — BP 120/76 | HR 67 | Ht 68.0 in | Wt 179.8 lb

## 2023-09-11 DIAGNOSIS — I10 Essential (primary) hypertension: Secondary | ICD-10-CM

## 2023-09-11 DIAGNOSIS — Z95810 Presence of automatic (implantable) cardiac defibrillator: Secondary | ICD-10-CM | POA: Diagnosis not present

## 2023-09-11 DIAGNOSIS — R002 Palpitations: Secondary | ICD-10-CM

## 2023-09-11 DIAGNOSIS — I428 Other cardiomyopathies: Secondary | ICD-10-CM

## 2023-09-11 DIAGNOSIS — I5022 Chronic systolic (congestive) heart failure: Secondary | ICD-10-CM

## 2023-09-11 LAB — CUP PACEART INCLINIC DEVICE CHECK
Date Time Interrogation Session: 20250827121409
Implantable Lead Connection Status: 753985
Implantable Lead Implant Date: 20060117
Implantable Lead Location: 753860
Implantable Lead Model: 185
Implantable Lead Serial Number: 124785
Implantable Pulse Generator Implant Date: 20141107
Pulse Gen Serial Number: 126350

## 2023-09-11 NOTE — Patient Instructions (Signed)
 Medication Instructions:  Your physician recommends that you continue on your current medications as directed. Please refer to the Current Medication list given to you today.  *If you need a refill on your cardiac medications before your next appointment, please call your pharmacy*  Lab Work: None ordered If you have labs (blood work) drawn today and your tests are completely normal, you will receive your results only by: MyChart Message (if you have MyChart) OR A paper copy in the mail If you have any lab test that is abnormal or we need to change your treatment, we will call you to review the results.  Testing/Procedures: GEOFFRY HEWS- Long Term Monitor Instructions  Your physician has requested you wear a ZIO patch monitor for 14 days.  This is a single patch monitor. [employer] supplies one patch monitor per enrollment. Additional stickers are not available. Please do not apply patch if you will be having a Nuclear Stress Test,  Echocardiogram, Cardiac CT, MRI, or Chest Xray during the period you would be wearing the  monitor. The patch cannot be worn during these tests. You cannot remove and re-apply the  ZIO XT patch monitor.  Your ZIO patch monitor will be mailed 3 day USPS to your address on file. It may take 3-5 days  to receive your monitor after you have been enrolled.  Once you have received your monitor, please review the enclosed instructions. Your monitor  has already been registered assigning a specific monitor serial # to you.  Billing and Patient Assistance Program Information  We have supplied [employer] with any of your insurance information on file for billing purposes. [employer] offers a sliding scale Patient Assistance Program for patients that do not have  insurance, or whose insurance does not completely cover the cost of the ZIO monitor.  You must apply for the Patient Assistance Program to qualify for this discounted rate.  To apply, please call [employer] at [phone],  select option 4, select option 2, ask to apply for  Patient Assistance Program. Meredeth will ask your household income, and how many people  are in your household. They will quote your out-of-pocket cost based on that information.  [employer] will also be able to set up a 71-month, interest-free payment plan if needed.  Applying the monitor   Shave hair from upper left chest.  Hold abrader disc by orange tab. Rub abrader in 40 strokes over the upper left chest as  indicated in your monitor instructions.  Clean area with 4 enclosed alcohol pads. Let dry.  Apply patch as indicated in monitor instructions. Patch will be placed under collarbone on left  side of chest with arrow pointing upward.  Rub patch adhesive wings for 2 minutes. Remove white label marked 1. Remove the white  label marked 2. Rub patch adhesive wings for 2 additional minutes.  While looking in a mirror, press and release button in center of patch. A small green light will  flash 3-4 times. This will be your only indicator that the monitor has been turned on.  Do not shower for the first 24 hours. You may shower after the first 24 hours.  Press the button if you feel a symptom. You will hear a small click. Record Date, Time and  Symptom in the Patient Logbook.  When you are ready to remove the patch, follow instructions on the last 2 pages of Patient  Logbook. Stick patch monitor onto the last page of Patient Logbook.  Place Patient Logbook in the  blue and white box. Use locking tab on box and tape box closed  securely. The blue and white box has prepaid postage on it. Please place it in the mailbox as  soon as possible. Your physician should have your test results approximately 7 days after the  monitor has been mailed back to [facility].  Call [facility] Customer Care at [phone] if you have questions regarding  your ZIO XT patch monitor. Call them immediately if you see an orange light blinking on your   monitor.  If your monitor falls off in less than 4 days, contact our Monitor department at [phone].  If your monitor becomes loose or falls off after 4 days call [employer] at [phone] for  suggestions on securing your monitor   Follow-Up: At [facility], you and your health needs are our priority.  As part of our continuing mission to provide you with exceptional heart care, our providers are all part of one team.  This team includes your primary Cardiologist (physician) and Advanced Practice Providers or APPs (Physician Assistants and Nurse Practitioners) who all work together to provide you with the care you need, when you need it.  Your next appointment:   1 year(s)  Provider:   Daphne Barrack, NP

## 2023-09-11 NOTE — Progress Notes (Unsigned)
Enrolled for [employer] to mail a ZIO XT long term holter monitor to the patients address on file.  °Dr. Taylor to read. °

## 2023-09-26 ENCOUNTER — Telehealth (HOSPITAL_BASED_OUTPATIENT_CLINIC_OR_DEPARTMENT_OTHER): Payer: Self-pay

## 2023-09-26 NOTE — Telephone Encounter (Unsigned)
 Copied from CRM #[id number]. Topic: Clinical - Request for Lab/Test Order >> Sep 26, 2023  1:56 PM Lavanda D wrote: Reason for CRM: Patient would like the order sent to [facility] on [address]. He got an estimate for his Chest CT tomorrow and it was over 700$, he had it done at [facility] in the past and it was free of charge.

## 2023-09-27 ENCOUNTER — Ambulatory Visit (HOSPITAL_BASED_OUTPATIENT_CLINIC_OR_DEPARTMENT_OTHER)

## 2023-09-27 NOTE — Telephone Encounter (Addendum)
 CT reschedule for GI imaging [address] location. Patient notified and aware. Printed and mailed appointment reminder. Nothing further needed at this time.

## 2023-10-13 DIAGNOSIS — R002 Palpitations: Secondary | ICD-10-CM

## 2023-10-14 ENCOUNTER — Ambulatory Visit: Payer: Self-pay | Admitting: Student

## 2023-10-15 ENCOUNTER — Ambulatory Visit
Admission: RE | Admit: 2023-10-15 | Discharge: 2023-10-15 | Disposition: A | Discharge status: Home / Self Care | Source: Ambulatory Visit | Attending: Pulmonary Disease | Admitting: Pulmonary Disease

## 2023-10-15 DIAGNOSIS — R911 Solitary pulmonary nodule: Secondary | ICD-10-CM

## 2023-10-18 ENCOUNTER — Ambulatory Visit: Admitting: Internal Medicine

## 2023-10-18 ENCOUNTER — Encounter: Payer: Self-pay | Admitting: Internal Medicine

## 2023-10-18 ENCOUNTER — Ambulatory Visit: Payer: Self-pay | Admitting: Pulmonary Disease

## 2023-10-18 VITALS — BP 130/74 | HR 64 | Temp 98.3°F | Resp 16 | Ht 68.0 in | Wt 180.4 lb

## 2023-10-18 DIAGNOSIS — D751 Secondary polycythemia: Secondary | ICD-10-CM | POA: Insufficient documentation

## 2023-10-18 DIAGNOSIS — E113293 Type 2 diabetes mellitus with mild nonproliferative diabetic retinopathy without macular edema, bilateral: Secondary | ICD-10-CM | POA: Diagnosis not present

## 2023-10-18 DIAGNOSIS — E782 Mixed hyperlipidemia: Secondary | ICD-10-CM | POA: Diagnosis not present

## 2023-10-18 DIAGNOSIS — Z23 Encounter for immunization: Secondary | ICD-10-CM

## 2023-10-18 DIAGNOSIS — I10 Essential (primary) hypertension: Secondary | ICD-10-CM

## 2023-10-18 NOTE — Assessment & Plan Note (Addendum)
 We will also check his FLP and continue on his current cholesterol medications.  His goal LDL <70.

## 2023-10-18 NOTE — Progress Notes (Signed)
 Office Visit  Subjective   Patient ID: Justin Ray   DOB: 1965-05-04   Age: 58 y.o.   MRN: [id number]   Chief Complaint Chief Complaint  Patient presents with   Follow-up    3 Month follow up     History of Present Illness Over the interim, the patient did quit smoking on 10/09/2023.  He was smoking 1.5- 2 ppd but had cut back down to 1 ppd of cigarettes which he cut back in 01/2023 and then down to 1/2 ppd..  He started smoking at age 49. He has tried chantix in the past but this gave him nightmares. He also has tried nicotine  patches in the past.  We noted on his yearly labs in 04/2023 that he had an erythrocytosis with an elevated hemoglobin.  I felt this was related to his cigarette use.    The patient is a 58 year old Caucasian/White male who returns for a follow-up visit for his T2 diabetes.  This past year, we noted on a spot FSBS that his sugar was 48.  He denied any problems with hypoglycemia episodes at that time but I did cut back his humulin  70/30 from 65 Units in AM to 60 Units in AM and his 55 Units in PM down to 50 Units in the PM.  This past year, we had him discontinue his victoza  as his A1c has been well controlled.  He is watching his diet and has been walking for exercise.  He remains on metformin  ER 2000mg  daily, jardiance  25mg  daily and humulin  70/30 with 60 Units in AM and 50 Units in PM.  He specifically denies unexplained abdominal pain, nausea, vomiting or diarrhea or hypoglyemia.  He states his FSBS are running between 70-120 where he check his FSBS once a day.   He states he tries to eats 2 meals per day but eats snacks and he is now eating a pack of crackers at night before bed.  His last HgBA1c was done 6 months ago and was 5.8%.  He came in fasting today in anticipation of lab work.  He denies any diabetic neuropathy, nephropathy or cardiovascular disease.  He was noted to have some microalbuminuria in the past.  He did have a dilated diabetic eye exam done on  12/08/2022 with Dr. Sharalyn at [facility]  where he has mild diabetic retinopathy in both eyes that is improved per their notes.   The patient is a 58 year old Caucasian/White male who presents for a follow-up evaluation of hypertension.  Since his last visit, he has not had any problems with his BP.  The patient has been checking his blood pressure at home. The patient's blood pressure has ranged systollically in the 130-140's. The patient's current medications include: carvedilol  25mg  BID, hydrochlorothiazide  25mg  po every other day and lisinopril  20mg  BID. The patient has been tolerating his medications well. The patient denies any headache, visual changes, dizziness, lightheadness, chest pain, shortness of breath, weakness/numbness, and edema.  He reports there have been no other symptoms noted.  We stopped his HCTZ last year due to his dizziness with orthostatic hypotension.  He is now back on hydrochlorothiazide .  Justin Ray returns today for routine followup on his cholesterol.  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  Ihis triglyceride has been repeatedly elevated.  I asked him to eat healthy, cut fats in his diet and exercise.  Overall, he states he is doing well and is without any  complaints or problems at this time. He specifically denies abdominal pain, nausea, vomiting, diarrhea, myalgias, and fatigue. He remains on dietary management as well as the following cholesterol lowering medications crestor  10mg  daily, fenofibrate  160mg  daily, lovaza  2g BID.  He is fasting in anticipation for labs today.      Past Medical History Past Medical History:  Diagnosis Date   AICD (automatic cardioverter/defibrillator) present    Anxiety    Basal cell carcinoma    Carpal tunnel syndrome 10/2012   right-GSC   Cataract    Chronic systolic (congestive) heart failure (HCC)    COPD (chronic obstructive pulmonary disease) (HCC)    Depression    Diabetic  retinopathy associated with type 2 diabetes mellitus (HCC)    Hyperlipidemia    Hypertension    ICD (implantable cardioverter-defibrillator) in place    Non-ischemic cardiomyopathy (HCC)    OSA (obstructive sleep apnea)    Osteoporosis    Tobacco abuse    Ventricular tachycardia (HCC)      Allergies Allergies  Allergen Reactions   Codeine Shortness Of Breath and Other (See Comments)    Severe HA's.   Niaspan [Niacin Er (Antihyperlipidemic)] Other (See Comments)    Per pt feels like skin is burning( REDNESS), and pins/needles are sticking    Sulfonamide Derivatives Other (See Comments)    thrush     Medications  Current Outpatient Medications:    acetaminophen  (TYLENOL ) 500 MG tablet, Take 2 tablets (1,000 mg total) by mouth every 6 (six) hours as needed for up to 30 doses., Disp: 30 tablet, Rfl: 0   albuterol  (VENTOLIN  HFA) 108 (90 Base) MCG/ACT inhaler, Inhale 2 puffs into the lungs every 6 (six) hours as needed for wheezing or shortness of breath., Disp: 1 each, Rfl: 3   alendronate  (FOSAMAX ) 70 MG tablet, Take 1 tablet (70 mg total) by mouth every 7 (seven) days. Take with a full glass of water on an empty stomach., Disp: 4 tablet, Rfl: 11   ARIPiprazole  (ABILIFY ) 2 MG tablet, TAKE 1 TABLET(2 MG) BY MOUTH DAILY, Disp: 30 tablet, Rfl: 1   aspirin  81 MG tablet, Take 81 mg by mouth daily. , Disp: , Rfl:    BD INSULIN  SYRINGE U/F 31G X 5/16 1 ML MISC, AS DIRECTED TWICE DAILY, Disp: 100 each, Rfl: 3   blood glucose meter kit and supplies KIT, Test blood sugar 3 times daily. Dx code: E11.65, Disp: 1 each, Rfl: 0   Calcium  Carb-Cholecalciferol (CALCIUM -VITAMIN D ) 600-20 MG-MCG TABS, Take by mouth in the morning and at bedtime., Disp: , Rfl:    carvedilol  (COREG ) 25 MG tablet, TAKE 1 TABLET(25 MG) BY MOUTH TWICE DAILY WITH FOOD, Disp: 180 tablet, Rfl: 1   clonazePAM  (KLONOPIN ) 0.5 MG tablet, Take 1 tablet (0.5 mg total) by mouth 2 (two) times daily as needed., Disp: 30 tablet, Rfl: 1    desvenlafaxine  (PRISTIQ ) 100 MG 24 hr tablet, TAKE 1 TABLET BY MOUTH EVERY DAY, Disp: 30 tablet, Rfl: 5   fenofibrate  160 MG tablet, Take 1 tablet (160 mg total) by mouth daily., Disp: 90 tablet, Rfl: 3   Fluticasone-Umeclidin-Vilant (TRELEGY ELLIPTA ) 100-62.5-25 MCG/ACT AEPB, Inhale 1 puff into the lungs daily., Disp: 60 each, Rfl: 11   glucose blood test strip, Check FSBS 3-4 times per day, Disp: 100 each, Rfl: 12   HUMULIN  70/30 (70-30) 100 UNIT/ML injection, INJECT 60 UNITS UNDER THE SKIN IN THE AM AND 50 UNITS AT NIGHT, Disp: 40 mL, Rfl: 1   hydrochlorothiazide  (HYDRODIURIL )  25 MG tablet, TAKE 1 TABLET(25 MG) BY MOUTH EVERY OTHER DAY, Disp: 45 tablet, Rfl: 3   JARDIANCE  25 MG TABS tablet, TAKE 1 TABLET(25 MG) BY MOUTH DAILY BEFORE BREAKFAST, Disp: 90 tablet, Rfl: 1   lisinopril  (ZESTRIL ) 20 MG tablet, Take 1 tablet (20 mg total) by mouth in the morning and at bedtime., Disp: 180 tablet, Rfl: 3   metFORMIN  (GLUCOPHAGE -XR) 500 MG 24 hr tablet, Take 2 tablets (1,000 mg total) by mouth 2 (two) times daily., Disp: 360 tablet, Rfl: 3   omega-3 acid ethyl esters (LOVAZA ) 1 g capsule, Take 2 capsules (2 g total) by mouth 2 (two) times daily., Disp: 360 capsule, Rfl: 3   Review of Systems Review of Systems  Constitutional:  Negative for chills, fever and malaise/fatigue.  Eyes:  Negative for blurred vision and double vision.  Respiratory:  Negative for cough and shortness of breath.   Cardiovascular:  Negative for chest pain, palpitations and leg swelling.  Gastrointestinal:  Negative for abdominal pain, constipation, diarrhea, nausea and vomiting.  Genitourinary:  Negative for frequency.  Musculoskeletal:  Negative for myalgias.  Skin:  Negative for itching and rash.  Neurological:  Negative for dizziness, weakness and headaches.  Endo/Heme/Allergies:  Negative for polydipsia.       Objective:    Vitals BP 130/74   Pulse 64   Temp 98.3 F (36.8 C) (Temporal)   Resp 16   Ht 5' 8 (1.727  m)   Wt 180 lb 6.4 oz (81.8 kg)   SpO2 94%   BMI 27.43 kg/m    Physical Examination Physical Exam Constitutional:      Appearance: Normal appearance. He is not ill-appearing.  Cardiovascular:     Rate and Rhythm: Normal rate and regular rhythm.     Pulses: Normal pulses.     Heart sounds: No murmur heard.    No friction rub. No gallop.  Pulmonary:     Effort: Pulmonary effort is normal. No respiratory distress.     Breath sounds: No wheezing, rhonchi or rales.  Abdominal:     General: Abdomen is flat. Bowel sounds are normal. There is no distension.     Palpations: Abdomen is soft.     Tenderness: There is no abdominal tenderness.  Musculoskeletal:     Right lower leg: No edema.     Left lower leg: No edema.  Skin:    General: Skin is warm and dry.     Findings: No rash.  Neurological:     General: No focal deficit present.     Mental Status: He is alert and oriented to person, place, and time.  Psychiatric:        Mood and Affect: Mood normal.        Behavior: Behavior normal.        Assessment & Plan:   Essential hypertension His BP is controlled.  We will continue his current medications.  Mild nonproliferative diabetic retinopathy of both eyes without macular edema associated with type 2 diabetes mellitus (HCC) He has an eye appointment coming up in 12/2023.  We will check his HgBa1c today.  Erythrocytosis He has now quit smoking. We will recheck his HgB level today.  Hyperlipidemia, mixed We will also check his FLP and continue on his current cholesterol medications.  His goal LDL <70.    Return in about 3 months (around 01/18/2024).   Selinda Fleeta Finger, MD

## 2023-10-18 NOTE — Assessment & Plan Note (Signed)
His BP is controlled.  We will continue his current medications. 

## 2023-10-18 NOTE — Assessment & Plan Note (Signed)
 He has now quit smoking. We will recheck his HgB level today.

## 2023-10-18 NOTE — Addendum Note (Signed)
 Addended by: LENETTA LACKS on: 10/18/2023 09:20 AM   Modules accepted: Orders

## 2023-10-18 NOTE — Assessment & Plan Note (Signed)
 He has an eye appointment coming up in 12/2023.  We will check his HgBa1c today.

## 2023-10-19 LAB — CMP14 + ANION GAP
ALT: 32 IU/L (ref 0–44)
AST: 26 IU/L (ref 0–40)
Albumin: 4.4 g/dL (ref 3.8–4.9)
Alkaline Phosphatase: 62 IU/L (ref 47–123)
Anion Gap: 13 mmol/L (ref 10.0–18.0)
BUN/Creatinine Ratio: 21 (ref 10–24)
BUN: 22 mg/dL (ref 8–27)
Bilirubin Total: 0.4 mg/dL (ref 0.0–1.2)
CO2: 22 mmol/L (ref 20–29)
Calcium: 9.8 mg/dL (ref 8.6–10.2)
Chloride: 104 mmol/L (ref 96–106)
Creatinine, Ser: 1.06 mg/dL (ref 0.76–1.27)
Globulin, Total: 2.5 g/dL (ref 1.5–4.5)
Glucose: 75 mg/dL (ref 70–99)
Potassium: 4.7 mmol/L (ref 3.5–5.2)
Sodium: 139 mmol/L (ref 134–144)
Total Protein: 6.9 g/dL (ref 6.0–8.5)
eGFR: 80 mL/min/1.73 (ref >59)

## 2023-10-19 LAB — CBC WITH DIFFERENTIAL/PLATELET
Basophils Absolute: 0.1 x10E3/uL (ref 0.0–0.2)
Basos: 1 %
EOS (ABSOLUTE): 0.2 x10E3/uL (ref 0.0–0.4)
Eos: 2 %
Hematocrit: 57 % — ABNORMAL HIGH (ref 37.5–51.0)
Hemoglobin: 18.5 g/dL — ABNORMAL HIGH (ref 13.0–17.7)
Immature Grans (Abs): 0.1 x10E3/uL (ref 0.0–0.1)
Immature Granulocytes: 1 %
Lymphocytes Absolute: 1.5 x10E3/uL (ref 0.7–3.1)
Lymphs: 20 %
MCH: 31.1 pg (ref 26.6–33.0)
MCHC: 32.5 g/dL (ref 31.5–35.7)
MCV: 96 fL (ref 79–97)
Monocytes Absolute: 0.8 x10E3/uL (ref 0.1–0.9)
Monocytes: 10 %
Neutrophils Absolute: 5.2 x10E3/uL (ref 1.4–7.0)
Neutrophils: 66 %
Platelets: 200 x10E3/uL (ref 150–450)
RBC: 5.94 x10E6/uL — ABNORMAL HIGH (ref 4.14–5.80)
RDW: 13.7 % (ref 11.6–15.4)
WBC: 7.7 x10E3/uL (ref 3.4–10.8)

## 2023-10-19 LAB — LIPID PANEL
Chol/HDL Ratio: 4.4 ratio (ref 0.0–5.0)
Cholesterol, Total: 133 mg/dL (ref 100–199)
HDL: 30 mg/dL — ABNORMAL LOW (ref >39)
LDL Chol Calc (NIH): 61 mg/dL (ref 0–99)
Triglycerides: 260 mg/dL — ABNORMAL HIGH (ref 0–149)
VLDL Cholesterol Cal: 42 mg/dL — ABNORMAL HIGH (ref 5–40)

## 2023-10-19 LAB — HEMOGLOBIN A1C
Est. average glucose Bld gHb Est-mCnc: 117 mg/dL
Hgb A1c MFr Bld: 5.7 % — ABNORMAL HIGH (ref 4.8–5.6)

## 2023-10-22 ENCOUNTER — Ambulatory Visit: Payer: Self-pay

## 2023-10-22 ENCOUNTER — Ambulatory Visit

## 2023-10-22 ENCOUNTER — Other Ambulatory Visit: Payer: Self-pay | Admitting: Internal Medicine

## 2023-10-22 DIAGNOSIS — R002 Palpitations: Secondary | ICD-10-CM | POA: Diagnosis not present

## 2023-10-22 NOTE — Progress Notes (Signed)
 Patient called.  Patient aware.

## 2023-10-24 LAB — CUP PACEART REMOTE DEVICE CHECK
Battery Remaining Longevity: 24 mo
Battery Remaining Percentage: 25 %
Brady Statistic RV Percent Paced: 0 %
Date Time Interrogation Session: 20251007103200
HighPow Impedance: 77 Ohm
Implantable Lead Connection Status: 753985
Implantable Lead Implant Date: 20060117
Implantable Lead Location: 753860
Implantable Lead Model: 185
Implantable Lead Serial Number: 124785
Implantable Pulse Generator Implant Date: 20141107
Lead Channel Impedance Value: 620 Ohm
Lead Channel Pacing Threshold Amplitude: 1.6 V
Lead Channel Pacing Threshold Pulse Width: 0.8 ms
Lead Channel Setting Pacing Amplitude: 2.8 V
Lead Channel Setting Pacing Pulse Width: 0.8 ms
Lead Channel Setting Sensing Sensitivity: 0.6 mV
Pulse Gen Serial Number: 126350

## 2023-10-25 NOTE — Progress Notes (Signed)
 Remote ICD Transmission

## 2023-10-28 NOTE — Progress Notes (Signed)
 Remote ICD Transmission

## 2023-10-30 ENCOUNTER — Ambulatory Visit: Payer: Self-pay | Admitting: Internal Medicine

## 2023-11-02 ENCOUNTER — Other Ambulatory Visit: Payer: Self-pay | Admitting: Internal Medicine

## 2023-11-05 ENCOUNTER — Other Ambulatory Visit: Payer: Self-pay | Admitting: Internal Medicine

## 2023-11-30 ENCOUNTER — Other Ambulatory Visit (HOSPITAL_BASED_OUTPATIENT_CLINIC_OR_DEPARTMENT_OTHER): Payer: Self-pay | Admitting: Adult Health

## 2023-12-19 ENCOUNTER — Encounter (HOSPITAL_BASED_OUTPATIENT_CLINIC_OR_DEPARTMENT_OTHER): Payer: Self-pay | Admitting: Pulmonary Disease

## 2023-12-19 ENCOUNTER — Ambulatory Visit (HOSPITAL_BASED_OUTPATIENT_CLINIC_OR_DEPARTMENT_OTHER): Admitting: Pulmonary Disease

## 2023-12-19 VITALS — BP 127/73 | HR 64 | Temp 98.7°F | Ht 68.0 in | Wt 177.1 lb

## 2023-12-19 DIAGNOSIS — J4489 Other specified chronic obstructive pulmonary disease: Secondary | ICD-10-CM

## 2023-12-19 DIAGNOSIS — R911 Solitary pulmonary nodule: Secondary | ICD-10-CM | POA: Diagnosis not present

## 2023-12-19 DIAGNOSIS — J439 Emphysema, unspecified: Secondary | ICD-10-CM

## 2023-12-19 NOTE — Progress Notes (Signed)
 Subjective:    Patient ID: Justin Ray, male    DOB: 1965/03/02, 58 y.o.   MRN: [id number]   58 yo smoker for FU of COPD >40 Pyrs  Discussed the use of AI scribe software for clinical note transcription with the patient, who gave verbal consent to proceed.  History of Present Illness Justin Ray is a 58 year old male with COPD who presents for follow-up.  He uses Trelegy with good effect and uses albuterol  sparingly, mainly before exertion.  He is actively working on smoking cessation and has not smoked for the past few days. He uses mints for cravings and has removed cigarettes from his home.  A CT scan in October showed two stable lung nodules, one upper and one lower, with the larger upper nodule described as ground glass and unchanged over a year.  He is up to date with his flu shot. [place] for exercise and notes increased appetite since quitting smoking.    PMH : NICM s/p AICD 2006, OSA, HTN, HLD, and DM II on insulin .      Significant tests/ events reviewed   Spirometry 05/2023 shows severe obstruction with FEV1 38%   sleep study 6/ 2017  upper airway resistance syndrome with AHI of 2.6/h.  O2 saturation nadir 85%    10/2023 CT chest stable nodules 03/2023 CT chest wo con >>  stable nodules   09/2022 CT chest wo con >>  RUL 17 x 14 mmGGO RLL 8mm    Review of Systems  neg for any significant sore throat, dysphagia, itching, sneezing, nasal congestion or excess/ purulent secretions, fever, chills, sweats, unintended wt loss, pleuritic or exertional cp, hempoptysis, orthopnea pnd or change in chronic leg swelling. Also denies presyncope, palpitations, heartburn, abdominal pain, nausea, vomiting, diarrhea or change in bowel or urinary habits, dysuria,hematuria, rash, arthralgias, visual complaints, headache, numbness weakness or ataxia.      Objective:   Physical Exam  Gen. Pleasant, well-nourished, in no distress ENT - no thrush, no  pallor/icterus,no post nasal drip Neck: No JVD, no thyromegaly, no carotid bruits Lungs: no use of accessory muscles, no dullness to percussion, decreased BL  without rales or rhonchi  Cardiovascular: Rhythm regular, heart sounds  normal, no murmurs or gallops, no peripheral edema Musculoskeletal: No deformities, no cyanosis or clubbing        Assessment & Plan:   Assessment and Plan Assessment & Plan Chronic obstructive pulmonary disease, stage 3 COPD stage 3 with lung function at 40%, indicating significant damage. Continues on Trelegy with good effect. Albuterol  used as needed before activities. Risk of exacerbation with respiratory infections due to low lung reserve. - Continue Trelegy. - Use albuterol  as needed before activities. - Call if experiencing yellow-green phlegm, cough, or wheezing to initiate antibiotics or prednisone . - Encouraged smoking cessation to prevent further lung damage.  Pulmonary nodules, stable Two pulmonary nodules identified, one upper and one lower. Upper nodule is ground glass and larger. No change over the past year, suggesting stability. Differential includes benign causes or slow-growing malignancy. - Will schedule follow-up CT scan in October 2026 to monitor nodules.  Nicotine  dependence, in remission Nicotine  dependence in remission. Successfully reduced smoking and currently abstinent for the last few days. No use of nicotine  patches. Utilizes Tic Tacs and peppermints as substitutes. No cigarettes available at home. Smoking cessation is crucial for improving lung health and overall prognosis. - Encouraged continued abstinence from smoking. - Discussed potential weight gain due to increased  eating and recommended physical activity such as walking.

## 2023-12-19 NOTE — Patient Instructions (Signed)
  VISIT SUMMARY: Today, you came in for a follow-up visit regarding your COPD and overall lung health. We discussed your current medications, smoking cessation progress, and reviewed your recent CT scan results.  YOUR PLAN: -CHRONIC OBSTRUCTIVE PULMONARY DISEASE, STAGE 3: COPD stage 3 means that your lung function is at 40%, indicating significant damage. You should continue using Trelegy as prescribed and use albuterol  before activities as needed. If you experience yellow-green phlegm, cough, or wheezing, please call us  to start antibiotics or prednisone . Continuing to avoid smoking is crucial to prevent further lung damage.  -PULMONARY NODULES, STABLE: You have two stable lung nodules, one in the upper lung and one in the lower lung. The larger upper nodule is ground glass in appearance and has not changed over the past year, which is a good sign. We will schedule a follow-up CT scan in October 2026 to monitor these nodules.  -NICOTINE  DEPENDENCE, IN REMISSION: Your nicotine  dependence is currently in remission as you have successfully reduced smoking and have been abstinent for the last few days. You are using mints to manage cravings and have removed cigarettes from your home. Continuing to stay smoke-free is very important for your lung health. We also discussed the possibility of weight gain due to increased eating and recommended physical activity like walking to help manage this.  INSTRUCTIONS: Please continue using Trelegy and albuterol  as directed. If you notice any symptoms like yellow-green phlegm, cough, or wheezing, contact us  immediately. We will schedule your next CT scan for October 2026 to monitor your lung nodules. Keep up the great work with smoking cessation and try to stay active to manage your increased appetite.                      Contains text generated by Abridge.                                 Contains text generated by  [employer]bridge.

## 2023-12-21 ENCOUNTER — Other Ambulatory Visit: Payer: Self-pay | Admitting: Internal Medicine

## 2024-01-02 ENCOUNTER — Other Ambulatory Visit: Payer: Self-pay | Admitting: Internal Medicine

## 2024-01-07 ENCOUNTER — Telehealth: Payer: Self-pay | Admitting: Physician Assistant

## 2024-01-07 MED ORDER — ASPIRIN 81 MG PO TBEC: 81.0000 mg | DELAYED_RELEASE_TABLET | Freq: Every day | ORAL | 3 refills | Status: AC

## 2024-01-07 NOTE — Telephone Encounter (Signed)
 Refill sent

## 2024-01-07 NOTE — Telephone Encounter (Signed)
" °*  STAT* If patient is at the pharmacy, call can be transferred to refill team.   1. Which medications need to be refilled? (please list name of each medication and dose if known) aspirin  81 MG tablet   2. Which pharmacy/location (including street and city if local pharmacy) is medication to be sent to?  [facility] [phone] - [place], [place] - [address] AT [facility] OF [address] [phone]    3. Do they need a 30 day or 90 day supply? 90  "

## 2024-01-11 ENCOUNTER — Other Ambulatory Visit: Payer: Self-pay | Admitting: Internal Medicine

## 2024-01-15 ENCOUNTER — Other Ambulatory Visit: Payer: Self-pay | Admitting: Internal Medicine

## 2024-01-20 ENCOUNTER — Ambulatory Visit: Admitting: Internal Medicine

## 2024-01-20 ENCOUNTER — Encounter: Payer: Self-pay | Admitting: Internal Medicine

## 2024-01-20 VITALS — BP 122/80 | HR 71 | Temp 97.5°F | Resp 18 | Ht 68.0 in | Wt 178.0 lb

## 2024-01-20 DIAGNOSIS — G4733 Obstructive sleep apnea (adult) (pediatric): Secondary | ICD-10-CM

## 2024-01-20 DIAGNOSIS — E782 Mixed hyperlipidemia: Secondary | ICD-10-CM

## 2024-01-20 DIAGNOSIS — F330 Major depressive disorder, recurrent, mild: Secondary | ICD-10-CM

## 2024-01-20 DIAGNOSIS — E113293 Type 2 diabetes mellitus with mild nonproliferative diabetic retinopathy without macular edema, bilateral: Secondary | ICD-10-CM | POA: Diagnosis not present

## 2024-01-20 DIAGNOSIS — F411 Generalized anxiety disorder: Secondary | ICD-10-CM | POA: Diagnosis not present

## 2024-01-20 DIAGNOSIS — I10 Essential (primary) hypertension: Secondary | ICD-10-CM | POA: Diagnosis not present

## 2024-01-20 MED ORDER — ARIPIPRAZOLE 2 MG PO TABS: 2.0000 mg | ORAL_TABLET | Freq: Every day | ORAL | 6 refills | Status: AC

## 2024-01-20 NOTE — Progress Notes (Signed)
 "  Office Visit  Subjective   Patient ID: Justin Ray   DOB: [birth year]   Age: 59 y.o.   MRN: [id number]   Chief Complaint Chief Complaint  Patient presents with   Follow-up    3 month follow up     History of Present Illness 59 years old male with history of type 2 diabetes mellitus on insulin  70/30 60 units in the morning and 50 units in the evening and his hemoglobin A1c was 5.7 and 5.8 before.  He has few episodes of hypoglycemia.  He do not have continuous glucose monitor because he does not like that.  He also takes Jardiance .  He has seen eye doctor Dr. Sharalyn in [place] 2 months ago and he will see them once a year.  He has a history of cardiomyopathy, systolic heart failure, status post AICD placement and atrial fibrillation.  He says that he follows with cardiologist once a year nowadays.  He has history of hyperlipidemia and he takes fenofibrate  160 mg daily.  I will repeat lipid panel on next visit.  He has hypertension and he takes lisinopril  20 mg daily, hydrochlorothiazide  25 mg daily and carvedilol  25 mg twice a day.  His blood pressure is well-controlled.  He also has a history of depression.  He says that his depression is better.  He did take Pristiq  and aripiprazole  2 mg daily.  He also take clonazepam  0.5 mg twice a day as needed for anxiety.  He needs refill of aripiprazole .  He do not have any side effect from this medicine.  Past Medical History Past Medical History:  Diagnosis Date   AICD (automatic cardioverter/defibrillator) present    Anxiety    Basal cell carcinoma    Carpal tunnel syndrome 10/2012   right-GSC   Cataract    Chronic systolic (congestive) heart failure (HCC)    COPD (chronic obstructive pulmonary disease) (HCC)    Depression    Diabetic retinopathy associated with type 2 diabetes mellitus (HCC)    Hyperlipidemia    Hypertension    ICD (implantable cardioverter-defibrillator) in place    Non-ischemic cardiomyopathy (HCC)    OSA  (obstructive sleep apnea)    Osteoporosis    Tobacco abuse    Ventricular tachycardia (HCC)      Allergies Allergies[1]   Review of Systems Review of Systems  Constitutional: Negative.   HENT: Negative.    Respiratory: Negative.    Cardiovascular: Negative.   Gastrointestinal: Negative.   Neurological: Negative.        Objective:    Vitals BP 122/80   Pulse 71   Temp (!) 97.5 F (36.4 C)   Resp 18   Ht 5' 8 (1.727 m)   Wt 178 lb (80.7 kg)   SpO2 92%   BMI 27.06 kg/m    Physical Examination Physical Exam Constitutional:      Appearance: Normal appearance.  HENT:     Head: Normocephalic and atraumatic.  Cardiovascular:     Rate and Rhythm: Normal rate and regular rhythm.     Heart sounds: Normal heart sounds.  Pulmonary:     Breath sounds: Normal breath sounds.  Abdominal:     General: Bowel sounds are normal.     Palpations: Abdomen is soft.  Neurological:     General: No focal deficit present.     Mental Status: He is alert and oriented to person, place, and time.        Assessment & Plan:   Mild  episode of recurrent major depressive disorder I will send prescription of aripiprazole  2 mg daily and he will continue with Pristiq  100 mg daily.  Hyperlipidemia, mixed He takes fenofibrate  160 mg daily and I will do lipid panel on next visit.  GAD (generalized anxiety disorder) He takes clonazepam  as needed.  Mild nonproliferative diabetic retinopathy of both eyes without macular edema associated with type 2 diabetes mellitus (HCC) He is blood sugar is well-controlled.  He takes Humulin  70/30 60 units in the morning and 50 units in the evening, Jardiance  25 mg daily and metformin .  His hemoglobin A1c was 5.7 and he has a few episodes of hypoglycemia.  I have suggested to decrease the dose of Humulin  7030 to 50 units in the morning and 45 units in the evening and he will continue to monitor his blood sugar.  I have also suggested to use continuous glucose  monitor to prevent hypoglycemia.  OSA (obstructive sleep apnea) He is using CPAP  Essential hypertension His blood pressure is well-controlled.    Return in about 3 months (around 04/19/2024).   Roetta Dare, MD      [1]  Allergies Allergen Reactions   Codeine Shortness Of Breath and Other (See Comments)    Severe HA's.   Niaspan [Niacin Er (Antihyperlipidemic)] Other (See Comments)    Per pt feels like skin is burning( REDNESS), and pins/needles are sticking    Sulfonamide Derivatives Other (See Comments)    thrush   "

## 2024-01-20 NOTE — Assessment & Plan Note (Signed)
 I will send prescription of aripiprazole  2 mg daily and he will continue with Pristiq  100 mg daily.

## 2024-01-20 NOTE — Assessment & Plan Note (Signed)
 He takes fenofibrate  160 mg daily and I will do lipid panel on next visit.

## 2024-01-20 NOTE — Assessment & Plan Note (Signed)
His blood pressure is well-controlled 

## 2024-01-20 NOTE — Assessment & Plan Note (Signed)
 He is using CPAP

## 2024-01-20 NOTE — Assessment & Plan Note (Signed)
He takes clonazepam as needed

## 2024-01-20 NOTE — Assessment & Plan Note (Signed)
 He is blood sugar is well-controlled.  He takes Humulin  70/30 60 units in the morning and 50 units in the evening, Jardiance  25 mg daily and metformin .  His hemoglobin A1c was 5.7 and he has a few episodes of hypoglycemia.  I have suggested to decrease the dose of Humulin  7030 to 50 units in the morning and 45 units in the evening and he will continue to monitor his blood sugar.  I have also suggested to use continuous glucose monitor to prevent hypoglycemia.

## 2024-01-21 ENCOUNTER — Ambulatory Visit

## 2024-01-21 ENCOUNTER — Ambulatory Visit: Admitting: Internal Medicine

## 2024-01-21 DIAGNOSIS — R002 Palpitations: Secondary | ICD-10-CM

## 2024-01-22 LAB — CUP PACEART REMOTE DEVICE CHECK
Battery Remaining Longevity: 18 mo
Battery Remaining Percentage: 22 %
Brady Statistic RV Percent Paced: 0 %
Date Time Interrogation Session: 20260106092600
HighPow Impedance: 77 Ohm
Implantable Lead Connection Status: 753985
Implantable Lead Implant Date: 20060117
Implantable Lead Location: 753860
Implantable Lead Model: 185
Implantable Lead Serial Number: 124785
Implantable Pulse Generator Implant Date: 20141107
Lead Channel Impedance Value: 539 Ohm
Lead Channel Pacing Threshold Amplitude: 1.6 V
Lead Channel Pacing Threshold Pulse Width: 0.8 ms
Lead Channel Setting Pacing Amplitude: 2.8 V
Lead Channel Setting Pacing Pulse Width: 0.8 ms
Lead Channel Setting Sensing Sensitivity: 0.6 mV
Pulse Gen Serial Number: 126350

## 2024-01-23 ENCOUNTER — Ambulatory Visit: Payer: Self-pay | Admitting: Cardiology

## 2024-01-23 NOTE — Progress Notes (Signed)
 Remote ICD Transmission

## 2024-02-05 ENCOUNTER — Other Ambulatory Visit: Payer: Self-pay

## 2024-02-05 MED ORDER — EMPAGLIFLOZIN 25 MG PO TABS: 25.0000 mg | ORAL_TABLET | Freq: Every day | ORAL | 1 refills | Status: AC

## 2024-02-05 MED ORDER — HUMULIN 70/30 (70-30) 100 UNIT/ML ~~LOC~~ SUSP: SUBCUTANEOUS | 1 refills | Status: AC

## 2024-03-10 ENCOUNTER — Ambulatory Visit: Admitting: Student in an Organized Health Care Education/Training Program

## 2024-04-20 ENCOUNTER — Encounter: Admitting: Internal Medicine

## 2024-04-21 ENCOUNTER — Encounter

## 2024-05-01 ENCOUNTER — Other Ambulatory Visit

## 2024-07-21 ENCOUNTER — Encounter

## 2024-10-20 ENCOUNTER — Encounter

## 2025-01-19 ENCOUNTER — Encounter
